# Patient Record
Sex: Female | Born: 1945 | Race: White | Hispanic: No | Marital: Married | Smoking: Former smoker
Health system: Southern US, Community
[De-identification: ages and names within clinical notes are randomized; demographics above are authoritative.]

## PROBLEM LIST (undated history)

## (undated) DIAGNOSIS — B004 Herpesviral encephalitis: Secondary | ICD-10-CM

## (undated) DIAGNOSIS — E119 Type 2 diabetes mellitus without complications: Secondary | ICD-10-CM

## (undated) DIAGNOSIS — E079 Disorder of thyroid, unspecified: Secondary | ICD-10-CM

## (undated) DIAGNOSIS — E039 Hypothyroidism, unspecified: Secondary | ICD-10-CM

## (undated) DIAGNOSIS — I639 Cerebral infarction, unspecified: Secondary | ICD-10-CM

## (undated) HISTORY — PX: COLONOSCOPY: SHX174

## (undated) HISTORY — PX: OTHER SURGICAL HISTORY: SHX169

## (undated) HISTORY — PX: BREAST SURGERY: SHX581

## (undated) HISTORY — PX: BACK SURGERY: SHX140

---

## 1998-11-03 HISTORY — PX: BACK SURGERY: SHX140

## 2004-11-06 ENCOUNTER — Ambulatory Visit: Payer: Self-pay

## 2005-12-24 ENCOUNTER — Ambulatory Visit: Payer: Self-pay

## 2006-01-01 ENCOUNTER — Ambulatory Visit: Payer: Self-pay

## 2006-11-11 ENCOUNTER — Ambulatory Visit: Payer: Self-pay | Admitting: Internal Medicine

## 2007-01-11 ENCOUNTER — Ambulatory Visit: Payer: Self-pay

## 2007-02-02 ENCOUNTER — Ambulatory Visit: Payer: Self-pay | Admitting: Gastroenterology

## 2007-12-28 ENCOUNTER — Ambulatory Visit: Payer: Self-pay | Admitting: Unknown Physician Specialty

## 2008-12-28 ENCOUNTER — Ambulatory Visit: Payer: Self-pay

## 2010-01-08 ENCOUNTER — Ambulatory Visit: Payer: Self-pay

## 2011-01-16 ENCOUNTER — Ambulatory Visit: Payer: Self-pay

## 2012-02-19 ENCOUNTER — Ambulatory Visit: Payer: Self-pay

## 2014-11-15 ENCOUNTER — Emergency Department: Payer: Self-pay | Admitting: Emergency Medicine

## 2014-11-16 LAB — CBC
HCT: 41.3 % (ref 35.0–47.0)
HGB: 13.4 g/dL (ref 12.0–16.0)
MCH: 27.8 pg (ref 26.0–34.0)
MCHC: 32.5 g/dL (ref 32.0–36.0)
MCV: 86 fL (ref 80–100)
Platelet: 244 10*3/uL (ref 150–440)
RBC: 4.83 10*6/uL (ref 3.80–5.20)
RDW: 15.4 % — AB (ref 11.5–14.5)
WBC: 9.6 10*3/uL (ref 3.6–11.0)

## 2014-11-16 LAB — BASIC METABOLIC PANEL
Anion Gap: 8 (ref 7–16)
BUN: 19 mg/dL — AB (ref 7–18)
CHLORIDE: 107 mmol/L (ref 98–107)
CO2: 27 mmol/L (ref 21–32)
CREATININE: 0.83 mg/dL (ref 0.60–1.30)
Calcium, Total: 9.3 mg/dL (ref 8.5–10.1)
EGFR (Non-African Amer.): >60
Glucose: 157 mg/dL — ABNORMAL HIGH (ref 65–99)
Osmolality: 289 (ref 275–301)
Potassium: 4 mmol/L (ref 3.5–5.1)
Sodium: 142 mmol/L (ref 136–145)

## 2014-11-16 LAB — TROPONIN I

## 2016-04-24 ENCOUNTER — Emergency Department: Payer: Medicare Other

## 2016-04-24 ENCOUNTER — Inpatient Hospital Stay: Payer: Medicare Other

## 2016-04-24 ENCOUNTER — Encounter: Payer: Self-pay | Admitting: Urgent Care

## 2016-04-24 ENCOUNTER — Other Ambulatory Visit: Payer: Self-pay

## 2016-04-24 ENCOUNTER — Inpatient Hospital Stay
Admission: EM | Admit: 2016-04-24 | Discharge: 2016-05-02 | DRG: 097 | Disposition: A | Discharge status: Other Acute Inpatient Hospital | Payer: Medicare Other | Attending: Internal Medicine | Admitting: Internal Medicine

## 2016-04-24 DIAGNOSIS — G4089 Other seizures: Secondary | ICD-10-CM | POA: Diagnosis present

## 2016-04-24 DIAGNOSIS — J9602 Acute respiratory failure with hypercapnia: Secondary | ICD-10-CM | POA: Diagnosis present

## 2016-04-24 DIAGNOSIS — J9601 Acute respiratory failure with hypoxia: Secondary | ICD-10-CM | POA: Diagnosis present

## 2016-04-24 DIAGNOSIS — N179 Acute kidney failure, unspecified: Secondary | ICD-10-CM | POA: Diagnosis present

## 2016-04-24 DIAGNOSIS — G0490 Encephalitis and encephalomyelitis, unspecified: Secondary | ICD-10-CM | POA: Diagnosis not present

## 2016-04-24 DIAGNOSIS — Z8249 Family history of ischemic heart disease and other diseases of the circulatory system: Secondary | ICD-10-CM

## 2016-04-24 DIAGNOSIS — A86 Unspecified viral encephalitis: Secondary | ICD-10-CM | POA: Diagnosis present

## 2016-04-24 DIAGNOSIS — Z833 Family history of diabetes mellitus: Secondary | ICD-10-CM | POA: Diagnosis not present

## 2016-04-24 DIAGNOSIS — Z4659 Encounter for fitting and adjustment of other gastrointestinal appliance and device: Secondary | ICD-10-CM

## 2016-04-24 DIAGNOSIS — Z8051 Family history of malignant neoplasm of kidney: Secondary | ICD-10-CM | POA: Diagnosis not present

## 2016-04-24 DIAGNOSIS — E119 Type 2 diabetes mellitus without complications: Secondary | ICD-10-CM | POA: Diagnosis present

## 2016-04-24 DIAGNOSIS — E039 Hypothyroidism, unspecified: Secondary | ICD-10-CM | POA: Diagnosis present

## 2016-04-24 DIAGNOSIS — J9600 Acute respiratory failure, unspecified whether with hypoxia or hypercapnia: Secondary | ICD-10-CM | POA: Diagnosis not present

## 2016-04-24 DIAGNOSIS — E871 Hypo-osmolality and hyponatremia: Secondary | ICD-10-CM | POA: Diagnosis present

## 2016-04-24 DIAGNOSIS — R569 Unspecified convulsions: Secondary | ICD-10-CM | POA: Diagnosis present

## 2016-04-24 DIAGNOSIS — R4182 Altered mental status, unspecified: Secondary | ICD-10-CM | POA: Diagnosis not present

## 2016-04-24 DIAGNOSIS — G9340 Encephalopathy, unspecified: Secondary | ICD-10-CM | POA: Diagnosis present

## 2016-04-24 DIAGNOSIS — R509 Fever, unspecified: Secondary | ICD-10-CM

## 2016-04-24 DIAGNOSIS — M7989 Other specified soft tissue disorders: Secondary | ICD-10-CM | POA: Diagnosis not present

## 2016-04-24 DIAGNOSIS — R609 Edema, unspecified: Secondary | ICD-10-CM

## 2016-04-24 DIAGNOSIS — Z452 Encounter for adjustment and management of vascular access device: Secondary | ICD-10-CM

## 2016-04-24 DIAGNOSIS — R404 Transient alteration of awareness: Secondary | ICD-10-CM | POA: Diagnosis not present

## 2016-04-24 DIAGNOSIS — B004 Herpesviral encephalitis: Secondary | ICD-10-CM | POA: Diagnosis not present

## 2016-04-24 DIAGNOSIS — Z87891 Personal history of nicotine dependence: Secondary | ICD-10-CM

## 2016-04-24 DIAGNOSIS — Z7984 Long term (current) use of oral hypoglycemic drugs: Secondary | ICD-10-CM | POA: Diagnosis not present

## 2016-04-24 DIAGNOSIS — Z79899 Other long term (current) drug therapy: Secondary | ICD-10-CM | POA: Diagnosis not present

## 2016-04-24 DIAGNOSIS — E876 Hypokalemia: Secondary | ICD-10-CM | POA: Diagnosis present

## 2016-04-24 HISTORY — DX: Disorder of thyroid, unspecified: E07.9

## 2016-04-24 HISTORY — DX: Hypothyroidism, unspecified: E03.9

## 2016-04-24 HISTORY — DX: Type 2 diabetes mellitus without complications: E11.9

## 2016-04-24 LAB — CBC WITH DIFFERENTIAL/PLATELET
BASOS PCT: 0 %
Band Neutrophils: 0 %
Basophils Absolute: 0 10*3/uL (ref 0–0.1)
Basophils Absolute: 0 10*3/uL (ref 0–0.1)
Blasts: 0 %
EOS PCT: 0 %
Eosinophils Absolute: 0 10*3/uL (ref 0–0.7)
Eosinophils Absolute: 0 10*3/uL (ref 0–0.7)
Eosinophils Relative: 0 %
HCT: 38.6 % (ref 35.0–47.0)
HEMATOCRIT: 38.7 % (ref 35.0–47.0)
HEMOGLOBIN: 13.5 g/dL (ref 12.0–16.0)
Hemoglobin: 13.3 g/dL (ref 12.0–16.0)
LYMPHS ABS: 5.3 10*3/uL — AB (ref 1.0–3.6)
Lymphocytes Relative: 12 %
Lymphocytes Relative: 26 %
Lymphs Abs: 1.4 10*3/uL (ref 1.0–3.6)
MCH: 28.6 pg (ref 26.0–34.0)
MCH: 29.4 pg (ref 26.0–34.0)
MCHC: 34.8 g/dL (ref 32.0–36.0)
MCHC: 34.4 g/dL (ref 32.0–36.0)
MCV: 82.1 fL (ref 80.0–100.0)
MCV: 85.3 fL (ref 80.0–100.0)
MONOS PCT: 16 %
MYELOCYTES: 0 %
Metamyelocytes Relative: 0 %
Monocytes Absolute: 1.8 10*3/uL — ABNORMAL HIGH (ref 0.2–0.9)
Monocytes Absolute: 3.2 10*3/uL — ABNORMAL HIGH (ref 0.2–0.9)
NEUTROS ABS: 8.4 10*3/uL — AB (ref 1.4–6.5)
NEUTROS PCT: 58 %
NRBC: 0 /100{WBCs}
Neutro Abs: 11.7 10*3/uL — ABNORMAL HIGH (ref 1.4–6.5)
OTHER: 0 %
PLATELETS: 269 10*3/uL (ref 150–440)
Platelets: 226 10*3/uL (ref 150–440)
Promyelocytes Absolute: 0 %
RBC: 4.72 MIL/uL (ref 3.80–5.20)
RBC: 4.53 MIL/uL (ref 3.80–5.20)
RDW: 14.4 % (ref 11.5–14.5)
RDW: 14.5 % (ref 11.5–14.5)
WBC: 11.6 10*3/uL — ABNORMAL HIGH (ref 3.6–11.0)
WBC: 20.2 10*3/uL — AB (ref 3.6–11.0)

## 2016-04-24 LAB — BLOOD GAS, ARTERIAL
Acid-base deficit: 1.3 mmol/L (ref 0.0–2.0)
Allens test (pass/fail): POSITIVE — AB
BICARBONATE: 22.7 meq/L (ref 21.0–28.0)
FIO2: 100
O2 Saturation: 99.3 %
PATIENT TEMPERATURE: 37
PH ART: 7.42 (ref 7.350–7.450)
pCO2 arterial: 35 mmHg (ref 32.0–48.0)
pO2, Arterial: 144 mmHg — ABNORMAL HIGH (ref 83.0–108.0)

## 2016-04-24 LAB — COMPREHENSIVE METABOLIC PANEL
ALBUMIN: 4.2 g/dL (ref 3.5–5.0)
ALK PHOS: 47 U/L (ref 38–126)
ALT: 19 U/L (ref 14–54)
ALT: 20 U/L (ref 14–54)
ANION GAP: 21 — AB (ref 5–15)
AST: 29 U/L (ref 15–41)
AST: 39 U/L (ref 15–41)
Albumin: 4.3 g/dL (ref 3.5–5.0)
Alkaline Phosphatase: 49 U/L (ref 38–126)
Anion gap: 11 (ref 5–15)
BILIRUBIN TOTAL: 0.7 mg/dL (ref 0.3–1.2)
BUN: 17 mg/dL (ref 6–20)
BUN: 15 mg/dL (ref 6–20)
CALCIUM: 8.6 mg/dL — AB (ref 8.9–10.3)
CHLORIDE: 91 mmol/L — AB (ref 101–111)
CO2: 23 mmol/L (ref 22–32)
CO2: 18 mmol/L — AB (ref 22–32)
CREATININE: 0.94 mg/dL (ref 0.44–1.00)
Calcium: 8.4 mg/dL — ABNORMAL LOW (ref 8.9–10.3)
Chloride: 92 mmol/L — ABNORMAL LOW (ref 101–111)
Creatinine, Ser: 1.24 mg/dL — ABNORMAL HIGH (ref 0.44–1.00)
GFR calc Af Amer: >60 mL/min (ref >60)
GFR calc non Af Amer: >60 mL/min (ref >60)
GFR, EST AFRICAN AMERICAN: 50 mL/min — AB (ref >60)
GFR, EST NON AFRICAN AMERICAN: 43 mL/min — AB (ref >60)
GLUCOSE: 149 mg/dL — AB (ref 65–99)
Glucose, Bld: 141 mg/dL — ABNORMAL HIGH (ref 65–99)
POTASSIUM: 3.2 mmol/L — AB (ref 3.5–5.1)
Potassium: 3.8 mmol/L (ref 3.5–5.1)
SODIUM: 130 mmol/L — AB (ref 135–145)
Sodium: 126 mmol/L — ABNORMAL LOW (ref 135–145)
TOTAL PROTEIN: 7.2 g/dL (ref 6.5–8.1)
Total Bilirubin: 0.5 mg/dL (ref 0.3–1.2)
Total Protein: 7.5 g/dL (ref 6.5–8.1)

## 2016-04-24 LAB — CSF CELL COUNT WITH DIFFERENTIAL
EOS CSF: 0 %
EOS CSF: 0 %
LYMPHS CSF: 56 %
Lymphs, CSF: 51 %
MONOCYTE-MACROPHAGE-SPINAL FLUID: 33 %
Monocyte-Macrophage-Spinal Fluid: 34 %
RBC COUNT CSF: 1361 /mm3 — AB (ref 0–3)
RBC COUNT CSF: 216 /mm3 — AB (ref 0–3)
SEGMENTED NEUTROPHILS-CSF: 10 %
Segmented Neutrophils-CSF: 16 %
TUBE #: 1
Tube #: 4
WBC CSF: 51 /mm3
WBC, CSF: 49 /mm3

## 2016-04-24 LAB — URINALYSIS COMPLETE WITH MICROSCOPIC (ARMC ONLY)
Bilirubin Urine: NEGATIVE
GLUCOSE, UA: 50 mg/dL — AB
Hgb urine dipstick: NEGATIVE
Ketones, ur: NEGATIVE mg/dL
Leukocytes, UA: NEGATIVE
Nitrite: NEGATIVE
Protein, ur: NEGATIVE mg/dL
SQUAMOUS EPITHELIAL / LPF: NONE SEEN
Specific Gravity, Urine: 1.012 (ref 1.005–1.030)
pH: 6 (ref 5.0–8.0)

## 2016-04-24 LAB — PROTEIN AND GLUCOSE, CSF
GLUCOSE CSF: 59 mg/dL (ref 40–70)
TOTAL PROTEIN, CSF: 73 mg/dL — AB (ref 15–45)

## 2016-04-24 LAB — PHOSPHORUS: Phosphorus: 1.7 mg/dL — ABNORMAL LOW (ref 2.5–4.6)

## 2016-04-24 LAB — LACTIC ACID, PLASMA
LACTIC ACID, VENOUS: 1.4 mmol/L (ref 0.5–2.0)
Lactic Acid, Venous: 1.4 mmol/L (ref 0.5–2.0)

## 2016-04-24 LAB — MAGNESIUM: Magnesium: 1.8 mg/dL (ref 1.7–2.4)

## 2016-04-24 LAB — GLUCOSE, CAPILLARY
GLUCOSE-CAPILLARY: 165 mg/dL — AB (ref 65–99)
Glucose-Capillary: 158 mg/dL — ABNORMAL HIGH (ref 65–99)

## 2016-04-24 LAB — MRSA PCR SCREENING: MRSA by PCR: NEGATIVE

## 2016-04-24 LAB — T4, FREE: FREE T4: 1.52 ng/dL — AB (ref 0.61–1.12)

## 2016-04-24 LAB — TROPONIN I: Troponin I: <0.03 ng/mL (ref <0.031)

## 2016-04-24 MED ORDER — DEXTROSE 5 % IV SOLN
2.0000 g | Freq: Two times a day (BID) | INTRAVENOUS | Status: DC
  Administered 2016-04-24: 2 g via INTRAVENOUS
  Filled 2016-04-24 (×3): qty 2

## 2016-04-24 MED ORDER — LORAZEPAM 2 MG/ML IJ SOLN
2.0000 mg | Freq: Once | INTRAMUSCULAR | Status: AC
  Administered 2016-04-24: 2 mg via INTRAVENOUS

## 2016-04-24 MED ORDER — LORAZEPAM 2 MG/ML IJ SOLN
INTRAMUSCULAR | Status: AC
  Administered 2016-04-24: 1 mg via INTRAVENOUS
  Filled 2016-04-24: qty 1

## 2016-04-24 MED ORDER — VANCOMYCIN HCL IN DEXTROSE 1-5 GM/200ML-% IV SOLN
1000.0000 mg | Freq: Once | INTRAVENOUS | Status: AC
  Administered 2016-04-24: 1000 mg via INTRAVENOUS
  Filled 2016-04-24: qty 200

## 2016-04-24 MED ORDER — ACETAMINOPHEN 325 MG PO TABS
650.0000 mg | ORAL_TABLET | ORAL | Status: DC | PRN
  Administered 2016-04-24 – 2016-04-25 (×3): 650 mg via ORAL
  Filled 2016-04-24 (×4): qty 2

## 2016-04-24 MED ORDER — ACETAMINOPHEN 650 MG RE SUPP
RECTAL | Status: AC
  Administered 2016-04-24: 650 mg via RECTAL
  Filled 2016-04-24: qty 1

## 2016-04-24 MED ORDER — SODIUM CHLORIDE 0.9 % IV BOLUS (SEPSIS)
500.0000 mL | Freq: Once | INTRAVENOUS | Status: AC
  Administered 2016-04-24: 500 mL via INTRAVENOUS

## 2016-04-24 MED ORDER — NOREPINEPHRINE 4 MG/250ML-% IV SOLN
2.0000 ug/min | INTRAVENOUS | Status: DC
  Administered 2016-04-24: 2 ug/min via INTRAVENOUS
  Administered 2016-04-25: 4 ug/min via INTRAVENOUS
  Administered 2016-04-25: 3.5 ug/min via INTRAVENOUS
  Administered 2016-04-26: 2 ug/min via INTRAVENOUS
  Administered 2016-04-26: 3 ug/min via INTRAVENOUS
  Administered 2016-04-26: 2.5 ug/min via INTRAVENOUS
  Filled 2016-04-24 (×4): qty 250

## 2016-04-24 MED ORDER — FENTANYL CITRATE (PF) 100 MCG/2ML IJ SOLN: 50.0000 ug | Freq: Once | INTRAMUSCULAR | Status: DC

## 2016-04-24 MED ORDER — ETOMIDATE 2 MG/ML IV SOLN
20.0000 mg | Freq: Once | INTRAVENOUS | Status: AC
  Administered 2016-04-24: 20 mg via INTRAVENOUS

## 2016-04-24 MED ORDER — ACETAMINOPHEN 650 MG RE SUPP
650.0000 mg | Freq: Once | RECTAL | Status: AC
  Administered 2016-04-24: 650 mg via RECTAL

## 2016-04-24 MED ORDER — DEXTROSE 5 % IV SOLN
1.0000 g | Freq: Once | INTRAVENOUS | Status: AC
  Administered 2016-04-24: 1 g via INTRAVENOUS
  Filled 2016-04-24: qty 10

## 2016-04-24 MED ORDER — MAGNESIUM SULFATE 2 GM/50ML IV SOLN
2.0000 g | Freq: Once | INTRAVENOUS | Status: AC
  Administered 2016-04-24: 2 g via INTRAVENOUS
  Filled 2016-04-24: qty 50

## 2016-04-24 MED ORDER — DEXTROSE 5 % IV SOLN
30.0000 mmol | Freq: Once | INTRAVENOUS | Status: AC
  Administered 2016-04-24: 30 mmol via INTRAVENOUS
  Filled 2016-04-24: qty 10

## 2016-04-24 MED ORDER — FAMOTIDINE IN NACL 20-0.9 MG/50ML-% IV SOLN
20.0000 mg | Freq: Two times a day (BID) | INTRAVENOUS | Status: DC
  Administered 2016-04-24 – 2016-04-25 (×2): 20 mg via INTRAVENOUS
  Filled 2016-04-24 (×3): qty 50

## 2016-04-24 MED ORDER — FENTANYL BOLUS VIA INFUSION
25.0000 ug | INTRAVENOUS | Status: DC | PRN
  Administered 2016-04-24 – 2016-04-25 (×5): 25 ug via INTRAVENOUS
  Filled 2016-04-24: qty 25

## 2016-04-24 MED ORDER — ONDANSETRON HCL 4 MG/2ML IJ SOLN: 4.0000 mg | Freq: Four times a day (QID) | INTRAMUSCULAR | Status: DC | PRN

## 2016-04-24 MED ORDER — INSULIN ASPART 100 UNIT/ML ~~LOC~~ SOLN
2.0000 [IU] | SUBCUTANEOUS | Status: DC
  Administered 2016-04-24 (×2): 4 [IU] via SUBCUTANEOUS
  Administered 2016-04-25 (×3): 2 [IU] via SUBCUTANEOUS
  Administered 2016-04-25: 4 [IU] via SUBCUTANEOUS
  Filled 2016-04-24: qty 2
  Filled 2016-04-24 (×2): qty 4
  Filled 2016-04-24 (×2): qty 2
  Filled 2016-04-24: qty 4

## 2016-04-24 MED ORDER — SODIUM CHLORIDE 0.9 % IV SOLN
INTRAVENOUS | Status: DC
  Administered 2016-04-24 – 2016-05-01 (×6): via INTRAVENOUS

## 2016-04-24 MED ORDER — SODIUM CHLORIDE 0.9% FLUSH
3.0000 mL | Freq: Two times a day (BID) | INTRAVENOUS | Status: DC
  Administered 2016-04-24 – 2016-04-28 (×8): 3 mL via INTRAVENOUS

## 2016-04-24 MED ORDER — LORAZEPAM 2 MG/ML IJ SOLN
1.0000 mg | Freq: Once | INTRAMUSCULAR | Status: AC
  Administered 2016-04-24: 1 mg via INTRAVENOUS

## 2016-04-24 MED ORDER — DEXTROSE 5 % IV SOLN
800.0000 mg | Freq: Two times a day (BID) | INTRAVENOUS | Status: DC
  Administered 2016-04-24: 800 mg via INTRAVENOUS
  Filled 2016-04-24 (×3): qty 16

## 2016-04-24 MED ORDER — MIDAZOLAM HCL 2 MG/2ML IJ SOLN
1.0000 mg | INTRAMUSCULAR | Status: AC | PRN
  Administered 2016-04-24 (×3): 1 mg via INTRAVENOUS
  Filled 2016-04-24: qty 2

## 2016-04-24 MED ORDER — SODIUM CHLORIDE 0.9 % IV BOLUS (SEPSIS)
250.0000 mL | INTRAVENOUS | Status: AC
  Administered 2016-04-24: 250 mL via INTRAVENOUS

## 2016-04-24 MED ORDER — DEXTROSE 5 % IV SOLN
10.0000 mg/kg | Freq: Once | INTRAVENOUS | Status: AC
  Administered 2016-04-24: 815 mg via INTRAVENOUS
  Filled 2016-04-24: qty 16.3

## 2016-04-24 MED ORDER — SODIUM CHLORIDE 0.9 % IV SOLN: 250.0000 mL | INTRAVENOUS | Status: DC | PRN

## 2016-04-24 MED ORDER — VANCOMYCIN HCL IN DEXTROSE 1-5 GM/200ML-% IV SOLN
1000.0000 mg | INTRAVENOUS | Status: DC
  Administered 2016-04-24: 1000 mg via INTRAVENOUS
  Filled 2016-04-24 (×3): qty 200

## 2016-04-24 MED ORDER — SODIUM CHLORIDE 0.9 % IV SOLN
500.0000 mg | Freq: Two times a day (BID) | INTRAVENOUS | Status: DC
  Administered 2016-04-25 – 2016-04-30 (×11): 500 mg via INTRAVENOUS
  Filled 2016-04-24 (×13): qty 5

## 2016-04-24 MED ORDER — ENOXAPARIN SODIUM 40 MG/0.4ML ~~LOC~~ SOLN
40.0000 mg | SUBCUTANEOUS | Status: DC
  Administered 2016-04-24 – 2016-05-01 (×8): 40 mg via SUBCUTANEOUS
  Filled 2016-04-24 (×9): qty 0.4

## 2016-04-24 MED ORDER — SODIUM CHLORIDE 0.9 % IV SOLN
1000.0000 mL | Freq: Once | INTRAVENOUS | Status: AC
  Administered 2016-04-24: 1000 mL via INTRAVENOUS

## 2016-04-24 MED ORDER — MIDAZOLAM HCL 2 MG/2ML IJ SOLN
1.0000 mg | INTRAMUSCULAR | Status: DC | PRN
  Administered 2016-04-24 – 2016-04-26 (×12): 1 mg via INTRAVENOUS
  Filled 2016-04-24 (×11): qty 2

## 2016-04-24 MED ORDER — SUCCINYLCHOLINE CHLORIDE 20 MG/ML IJ SOLN
100.0000 mg | Freq: Once | INTRAMUSCULAR | Status: AC
  Administered 2016-04-24: 100 mg via INTRAVENOUS

## 2016-04-24 MED ORDER — SODIUM CHLORIDE 0.9 % IV SOLN
2.0000 g | INTRAVENOUS | Status: DC
  Administered 2016-04-24 – 2016-04-25 (×5): 2 g via INTRAVENOUS
  Filled 2016-04-24 (×11): qty 2000

## 2016-04-24 MED ORDER — FENTANYL 2500MCG IN NS 250ML (10MCG/ML) PREMIX INFUSION
25.0000 ug/h | INTRAVENOUS | Status: DC
  Administered 2016-04-24: 50 ug/h via INTRAVENOUS
  Administered 2016-04-25 (×2): 300 ug/h via INTRAVENOUS
  Administered 2016-04-25: 250 ug/h via INTRAVENOUS
  Administered 2016-04-26: 300 ug/h via INTRAVENOUS
  Filled 2016-04-24 (×5): qty 250

## 2016-04-24 MED ORDER — POTASSIUM CHLORIDE 10 MEQ/50ML IV SOLN
10.0000 meq | INTRAVENOUS | Status: DC
  Administered 2016-04-24 (×4): 10 meq via INTRAVENOUS
  Filled 2016-04-24 (×4): qty 50

## 2016-04-24 MED ORDER — LEVOTHYROXINE SODIUM 100 MCG PO TABS
100.0000 ug | ORAL_TABLET | Freq: Every day | ORAL | Status: DC
  Administered 2016-04-25 – 2016-05-02 (×6): 100 ug via ORAL
  Filled 2016-04-24 (×5): qty 1

## 2016-04-24 MED ORDER — DEXTROSE 5 % IV SOLN: 2.0000 ug/min | INTRAVENOUS | Status: DC

## 2016-04-24 MED ORDER — LORAZEPAM 2 MG/ML IJ SOLN
INTRAMUSCULAR | Status: AC
  Administered 2016-04-24: 2 mg via INTRAVENOUS
  Filled 2016-04-24: qty 1

## 2016-04-24 MED ORDER — FAMOTIDINE IN NACL 20-0.9 MG/50ML-% IV SOLN: 20.0000 mg | INTRAVENOUS | Status: DC

## 2016-04-24 MED ORDER — CHLORHEXIDINE GLUCONATE 0.12% ORAL RINSE (MEDLINE KIT)
15.0000 mL | Freq: Two times a day (BID) | OROMUCOSAL | Status: DC
  Administered 2016-04-24 – 2016-04-26 (×4): 15 mL via OROMUCOSAL
  Filled 2016-04-24 (×5): qty 15

## 2016-04-24 MED ORDER — SODIUM CHLORIDE 0.9% FLUSH: 3.0000 mL | INTRAVENOUS | Status: DC | PRN

## 2016-04-24 MED ORDER — SODIUM CHLORIDE 0.9 % IV SOLN
1000.0000 mg | Freq: Once | INTRAVENOUS | Status: AC
  Administered 2016-04-24: 1000 mg via INTRAVENOUS
  Filled 2016-04-24: qty 10

## 2016-04-24 MED ORDER — ANTISEPTIC ORAL RINSE SOLUTION (CORINZ)
7.0000 mL | OROMUCOSAL | Status: DC
  Administered 2016-04-24 – 2016-04-26 (×15): 7 mL via OROMUCOSAL
  Filled 2016-04-24 (×21): qty 7

## 2016-04-24 NOTE — ED Provider Notes (Addendum)
[facility] Emergency Department Provider Note  L5 caveat: Review of systems and history is limited by confusion      Time seen: ----------------------------------------- 7:02 AM on 04/24/2016 -----------------------------------------    I have reviewed the triage vital signs and the nursing notes.   HISTORY  Chief Complaint No chief complaint on file.    HPI Cathy Harrington is a 70 y.o. female who presents the ER for altered mental status since Monday. Currently the patient is taking antibiotics for UTI. Eyes was concerned because she is having trouble thinking and speaking. States she cannot get her words out, is having difficulty communicating. Husband states when she starts to say something she never completes her thoughts. Situations, located by native language being Arabic. Patient states she is not in pain currently.   No past medical history on file.  There are no active problems to display for this patient.   No past surgical history on file.  Allergies Review of patient's allergies indicates not on file.  Social History Social History  Substance Use Topics  . Smoking status: Not on file  . Smokeless tobacco: Not on file  . Alcohol Use: Not on file    Review of Systems Unknown at this time. Positive for confusion, speech disturbance  ____________________________________________   PHYSICAL EXAM:  VITAL SIGNS: ED Triage Vitals  Enc Vitals Group     BP --      Pulse --      Resp --      Temp --      Temp src --      SpO2 --      Weight --      Height --      Head Cir --      Peak Flow --      Pain Score --      Pain Loc --      Pain Edu? --      Excl. in [place]? --     Constitutional: Alert But disoriented. No acute distress Eyes: Conjunctivae are normal. PERRL. Normal extraocular movements. ENT   Head: Normocephalic and atraumatic.   Nose: No congestion/rhinnorhea.   Mouth/Throat: Mucous membranes are  moist.   Neck: No stridor. Cardiovascular: Normal rate, regular rhythm. No murmurs, rubs, or gallops. Respiratory: Normal respiratory effort without tachypnea nor retractions. Breath sounds are clear and equal bilaterally. No wheezes/rales/rhonchi. Gastrointestinal: Soft and nontender. Normal bowel sounds Musculoskeletal: Nontender with normal range of motion in all extremities. No lower extremity tenderness nor edema. Neurologic:   Patient presents with speech and language difficulty. At times she cannot find work to say, she quickly reverts to speaking Arabic. She perseverates, has difficulty following simple tasks. Cranial nerves and motor function appear to be intact Skin:  Skin is warm, dry and intact. No rash noted. Psychiatric: Mood and affect are normal ____________________________________________  EKG: Interpreted by me. Sinus tachycardia with rate 107 bpm, normal axis, normal intervals, no evidence of hypertrophy or acute infarction. Unremarkable EKG  ____________________________________________  ED COURSE:  Pertinent labs & imaging results that were available during my care of the patient were reviewed by me and considered in my medical decision making (see chart for details). Patient resisted altered mental status. She'll be assessed for CVA as well as infection.   Patient had an uneventful course until she abruptly spiked a temperature to almost 104. Subsequently she had a seizure, generalized tonic-clonic seizure. She was given IV Ativan as well as started on  IV Keppra. We have started her on IV Rocephin, vancomycin, acyclovir for possible meningitis or encephalitis. Labs otherwise reveal hyponatremia which has been treated with saline. ____________________________________________    LABS (pertinent positives/negatives)  Labs Reviewed  CBC WITH DIFFERENTIAL/PLATELET - Abnormal; Notable for the following:    WBC 11.6 (*)    Neutro Abs 8.4 (*)    Monocytes Absolute 1.8  (*)    All other components within normal limits  COMPREHENSIVE METABOLIC PANEL - Abnormal; Notable for the following:    Sodium 126 (*)    Chloride 92 (*)    Glucose, Bld 149 (*)    Calcium 8.6 (*)    All other components within normal limits  URINALYSIS COMPLETEWITH MICROSCOPIC ([facility] ONLY) - Abnormal; Notable for the following:    Color, Urine YELLOW (*)    APPearance CLEAR (*)    Glucose, UA 50 (*)    Bacteria, UA RARE (*)    All other components within normal limits  T4, FREE - Abnormal; Notable for the following:    Free T4 1.52 (*)    All other components within normal limits  CBC WITH DIFFERENTIAL/PLATELET - Abnormal; Notable for the following:    WBC 20.2 (*)    All other components within normal limits  CULTURE, BLOOD (ROUTINE X 2)  CULTURE, BLOOD (ROUTINE X 2)  URINE CULTURE  CSF CULTURE  GRAM STAIN  CULTURE, BLOOD (ROUTINE X 2)  CULTURE, BLOOD (ROUTINE X 2)  TROPONIN I  LACTIC ACID, PLASMA  LACTIC ACID, PLASMA  CSF CELL COUNT WITH DIFFERENTIAL  CSF CELL COUNT WITH DIFFERENTIAL  PROTEIN AND GLUCOSE, CSF  COMPREHENSIVE METABOLIC PANEL  HIV ANTIBODY (ROUTINE TESTING)  HERPES SIMPLEX VIRUS(HSV) DNA BY PCR    RADIOLOGY Images were viewed by me  CT head   IMPRESSION: Mild chronic ischemic white matter disease. No acute intracranial abnormality seen.  INTUBATION Performed by: Lenise Arena E  Required items: required blood products, implants, devices, and special equipment available Patient identity confirmed: provided demographic data and hospital-assigned identification number Time out: Immediately prior to procedure a "time out" was called to verify the correct patient, procedure, equipment, support staff and site/side marked as required.  Indications: Altered mental status, airway protection   Intubation method: Glidescope Laryngoscopy   Preoxygenation: 100% BVM  Sedatives: 20 mg Etomidate Paralytic: 100 mg Succinylcholine  Tube Size: 8.0  cuffed  Post-procedure assessment: chest rise  Breath sounds: equal and absent over the epigastrium Tube secured with: ETT holder Chest x-ray interpreted by radiologist and me.  Chest x-ray findings: endotracheal tube in appropriate position  Patient tolerated the procedure well with no immediate complications.    LUMBAR PUNCTURE  Date/Time: 04/24/2016 at 12:19 PM Performed by: Lenise Arena E  Consent: Verbal consent obtained. Written consent obtained. Risks and benefits: risks, benefits and alternatives were discussed Consent given by: husband Patient understanding: patient states understanding of the procedure being performed  Patient consent: the patient's understanding of the procedure matches consent given  Procedure consent: procedure consent matches procedure scheduled  Relevant documents: relevant documents present and verified  Test results: test results available and properly labeled Site marked: the operative site was marked Imaging studies: imaging studies available  Required items: required blood products, implants, devices, and special equipment available  Patient identity confirmed: verbally with patient and arm band  Time out: Immediately prior to procedure a "time out" was called to verify the correct patient, procedure, equipment, support staff and site/side marked as required.  Indications: Fever,  altered mental status, seizure  Anesthesia: local infiltration Local anesthetic: lidocaine 1% without epinephrine Anesthetic total: 3 ml Patient sedated: Yes, IV Ativan  Analgesia: Ativan  Preparation: Patient was prepped and draped in the usual sterile fashion. Lumbar space: L3-L4 interspace Patient's position: left lateral decubitus Needle gauge: 22 Needle length: 3.5 in Number of attempts: 2 Fluid appearance: Clear Tubes of fluid: 4 Total volume: For ml Post-procedure: site cleaned and adhesive bandage applied Patient tolerance: Patient tolerated  the procedure well with no immediate complications CRITICAL CARE Performed by: Earleen Newport   Total critical care time: 30 minutes  Critical care time was exclusive of separately billable procedures and treating other patients.  Critical care was necessary to treat or prevent imminent or life-threatening deterioration.  Critical care was time spent personally by me on the following activities: development of treatment plan with patient and/or surrogate as well as nursing, discussions with consultants, evaluation of patient's response to treatment, examination of patient, obtaining history from patient or surrogate, ordering and performing treatments and interventions, ordering and review of laboratory studies, ordering and review of radiographic studies, pulse oximetry and re-evaluation of patient's condition.  ____________________________________________  FINAL ASSESSMENT AND PLAN  Altered mental status, fever, seizure, lumbar puncture  Plan: Patient with labs and imaging as dictated above. Patient was ED course as dictated above. She has been started on IV antibiotics and antiviral medicine to cover for meningitis or encephalitis. Patient may require intubation at some point at this point she is maintaining her airway. She does however remaining critical condition.   Earleen Newport, MD   Note: This dictation was prepared with Dragon dictation. Any transcriptional errors that result from this process are unintentional   Earleen Newport, MD 04/24/16 [place], MD 04/24/16 1220  Earleen Newport, MD 04/24/16 [place], MD 04/24/16 [phone]

## 2016-04-24 NOTE — Plan of Care (Signed)
Problem: Fluid Volume: Goal: Hemodynamic stability will improve Outcome: Progressing BP trends down with increase in sedation.  542ml NS bolus x2. Central line now being inserted for possible pressor support.  Problem: Physical Regulation: Goal: Diagnostic test results will improve Outcome: Progressing Lactic acid 1.4 Goal: Signs and symptoms of infection will decrease Outcome: Progressing Blood cultures, LP , sputum cultures all pending. Elevated WBC's. Multiple antibiotics ordered and given  Problem: Respiratory: Goal: Ability to maintain adequate ventilation will improve Outcome: Progressing Rhonchi bilateral lung fields.  Sputum white. Weaning fio2 to 40%  Problem: Activity: Goal: Ability to tolerate increased activity will improve Outcome: Progressing Bedrest.  Moves all extremities.  Localizes pain.  Problem: Coping: Goal: Level of anxiety will decrease Outcome: Progressing Anxiety improves with versed prn.  Problem: Nutritional: Goal: Intake of prescribed amount of daily calories will improve Outcome: Not Progressing NPO for now  Problem: Respiratory: Goal: Ability to maintain a clear airway and adequate ventilation will improve Outcome: Progressing VAP protocol initiated  Problem: Skin Integrity Impairment Risk: Goal: Risk for impaired skin integrity will decrease Outcome: Progressing No skin breakdown. Pink foam placed to sacrum.  Airflow rotation bed

## 2016-04-24 NOTE — ED Notes (Signed)
Pt appears sweaty and gazing without eye contact with stimuli.  Pt not following commands as well as before.  Pt unable to sit up or stand with 2 person assistance.  Pt tachypneic.  Temp 103.1 axillary.  PT I/O cathed at this time with 159ml out with cath.  MD notified.

## 2016-04-24 NOTE — Consult Note (Addendum)
Reason for Consult:Seizure Referring Physician: Kasa  CC: Seizure  HPI: Cathy Harrington is an 70 y.o. female who was brought in by her husband today due to an altered mental status since Monday.  Has had difficulty getting her words out.  Does not seem to know what she wants to say.  Today on presentation was able to walk in but at times was speaking in [place] and at other times speaking in Arabic.  Followed commands but easily distractible.  Disoriented.  Patient became febrile while in the ED.  Was noted to have eye deviation and then a generalized tonic clonic seizure.   On antibiotics for a UTI prior to admission.   Past Medical History  Diagnosis Date  . Thyroid disease   . Diabetes mellitus without complication [facility])     Past Surgical History  Procedure Laterality Date  . Back surgery      Family history: Father deceased.  With DM and renal cancer.  Mother alive with hypertension.    Social History:  reports that she has quit smoking. She does not have any smokeless tobacco history on file. She reports that she does not drink alcohol. Her drug history is not on file.  No Known Allergies  Medications:  I have reviewed the patient's current medications. Prior to Admission:  Prescriptions prior to admission  Medication Sig Dispense Refill Last Dose  . levothyroxine (SYNTHROID, LEVOTHROID) 100 MCG tablet Take 100 mcg by mouth daily.   04/23/2016 at am  . metFORMIN (GLUCOPHAGE) 1000 MG tablet Take 1,000 mg by mouth 2 (two) times daily.   04/23/2016 at pm  . sulfamethoxazole-trimethoprim (BACTRIM DS,SEPTRA DS) 800-160 MG tablet Take 1 tablet by mouth 2 (two) times daily. For 10 days   04/24/2016 at am   Scheduled: . acyclovir  800 mg Intravenous Q12H  . ampicillin (OMNIPEN) IV  2 g Intravenous Q4H  . cefTRIAXone (ROCEPHIN)  IV  1 g Intravenous Once  . cefTRIAXone (ROCEPHIN)  IV  2 g Intravenous Q12H  . enoxaparin (LOVENOX) injection  40 mg Subcutaneous Q24H  . famotidine (PEPCID)  IV  20 mg Intravenous Q12H  . fentaNYL (SUBLIMAZE) injection  50 mcg Intravenous Once  . magnesium sulfate 1 - 4 g bolus IVPB  2 g Intravenous Once  . potassium chloride  10 mEq Intravenous Q1 Hr x 4  . sodium chloride flush  3 mL Intravenous Q12H  . vancomycin  1,000 mg Intravenous Q18H    ROS: Unable to obtain due to intubation  Physical Examination: Blood pressure 123/75, pulse 117, temperature 103.1 F (39.5 C), temperature source Axillary, resp. rate 21, height 5\' 3"  (1.6 m), weight 81.647 kg (180 lb), SpO2 97 %.  HEENT-  Normocephalic, no lesions, without obvious abnormality.  Normal external eye and conjunctiva.  Normal TM's bilaterally.  Normal auditory canals and external ears. Normal external nose, mucus membranes and septum.  Normal pharynx. Cardiovascular- S1, S2 normal, pulses palpable throughout   Lungs- chest clear, no wheezing, rales, normal symmetric air entry Abdomen- soft, non-tender; bowel sounds normal; no masses,  no organomegaly Extremities- no edema Lymph-no adenopathy palpable Musculoskeletal-no joint tenderness, deformity or swelling Skin-warm and dry, no hyperpigmentation, vitiligo, or suspicious lesions  Neurological Examination: Patient intubated Mental Status: Eyes open.  Follows some simple commands.  No speech.   Cranial Nerves: II: patient does not respond confrontation bilaterally, pupils right 3 mm, left 3 mm,and reactive bilaterally III,IV,VI: Left gaze preference.  Does not go beyond midline to the  right.   V,VII: corneal reflex present bilaterally  VIII: grossly intact IX,X: gag reflex reduced, XI: trapezius strength unable to test bilaterally XII: tongue strength unable to test but patient attempts to extend tongue to command Motor: Moves all extremities with no focal weakness noted.   Sensory: Responds to noxious stimuli in all extremities. Deep Tendon Reflexes:  1+ in the upper extremities and absent in the lower  extremities Plantars: Mute  bilaterally Cerebellar: Unable to perform   Laboratory Studies:   Basic Metabolic Panel:  Recent Labs Lab 04/24/16 0750 04/24/16 1138  NA 126* 130*  K 3.8 3.2*  CL 92* 91*  CO2 23 18*  GLUCOSE 149* 141*  BUN 17 15  CREATININE 0.94 1.24*  CALCIUM 8.6* 8.4*  MG  --  1.8  PHOS  --  1.7*    Liver Function Tests:  Recent Labs Lab 04/24/16 0750 04/24/16 1138  AST 29 39  ALT 19 20  ALKPHOS 47 49  BILITOT 0.7 0.5  PROT 7.2 7.5  ALBUMIN 4.2 4.3   No results for input(s): LIPASE, AMYLASE in the last 168 hours. No results for input(s): AMMONIA in the last 168 hours.  CBC:  Recent Labs Lab 04/24/16 0750 04/24/16 1138  WBC 11.6* 20.2*  NEUTROABS 8.4* 11.7*  HGB 13.5 13.3  HCT 38.7 38.6  MCV 82.1 85.3  PLT 226 269    Cardiac Enzymes:  Recent Labs Lab 04/24/16 0750  TROPONINI <0.03    BNP: Invalid input(s): POCBNP  CBG: No results for input(s): GLUCAP in the last 168 hours.  Microbiology: Results for orders placed or performed during the hospital encounter of 04/24/16  CSF culture     Status: None (Preliminary result)   Collection Time: 04/24/16 11:38 AM  Result Value Ref Range Status   Specimen Description CSF  Final   Special Requests Normal  Final   Gram Stain RARE WBC SEEN NO ORGANISMS SEEN   Final   Culture PENDING  Incomplete   Report Status PENDING  Incomplete    Coagulation Studies: No results for input(s): LABPROT, INR in the last 72 hours.  Urinalysis:  Recent Labs Lab 04/24/16 1045  COLORURINE YELLOW*  LABSPEC 1.012  PHURINE 6.0  GLUCOSEU 50*  HGBUR NEGATIVE  BILIRUBINUR NEGATIVE  KETONESUR NEGATIVE  PROTEINUR NEGATIVE  NITRITE NEGATIVE  LEUKOCYTESUR NEGATIVE    Lipid Panel:  No results found for: CHOL, TRIG, HDL, CHOLHDL, VLDL, LDLCALC  HgbA1C: No results found for: HGBA1C  Urine Drug Screen:  No results found for: LABOPIA, COCAINSCRNUR, LABBENZ, AMPHETMU, THCU, LABBARB  Alcohol  Level: No results for input(s): ETH in the last 168 hours.  Other results: EKG: sinus tachycardia at 107 bpm.  Imaging: Dg Chest 1 View  04/24/2016  CLINICAL DATA:  Status post intubation. EXAM: CHEST 1 VIEW COMPARISON:  None FINDINGS: The ET tube tip is in the right mainstem bronchus. This should be withdrawn by approximately 1-2 cm. The heart size is enlarged and there is diffuse pulmonary vascular congestion. No focal airspace consolidation noted. IMPRESSION: 1. Right mainstem bronchus intubation. These results will be called to the ordering clinician or representative by the Radiologist Assistant, and communication documented in the PACS or zVision Dashboard. Electronically Signed   By: Kerby Moors M.D.   On: 04/24/2016 13:25   Ct Head Wo Contrast  04/24/2016  CLINICAL DATA:  Altered mental status. EXAM: CT HEAD WITHOUT CONTRAST TECHNIQUE: Contiguous axial images were obtained from the base of the skull through the vertex without  intravenous contrast. COMPARISON:  None. FINDINGS: Bony calvarium appears intact. Mild chronic ischemic white matter disease is noted. No mass effect or midline shift is noted. Ventricular size is within normal limits. There is no evidence of mass lesion, hemorrhage or acute infarction. IMPRESSION: Mild chronic ischemic white matter disease. No acute intracranial abnormality seen. Electronically Signed   By: Marijo Conception, M.D.   On: 04/24/2016 08:52     Assessment/Plan: 70 year old female presenting with confusion, fever, elevated white blood cell count and seizures.  No source of infection noted at this time.  LP performed in the ED.  Protein elevated.  Cell count remains pending.  Head CT personally reviewed and shows no acute changes.  Concern is for CNS infection.  Received Keppra in the ED.  Although patient remains altered with her following commands, unlikely that she is in subclinical status epilepticus.  EEG not indicated at this time.      Recommendations: 1.  Keppra 500mg  IV q 12hours for maintenance Keppra 2.  Seizure precautions 3.  Ativan prn seizures 4.  Agree with broad spectrum coverage to include Acyclovir.   5.  MRI of the brain with and without contrast once patient stable.    This patient is critically ill and at significant risk of neurological worsening, death and care requires constant monitoring of vital signs, hemodynamics,respiratory and cardiac monitoring, neurological assessment, discussion with family, other specialists and medical decision making of high complexity. I spent 45 minutes of neurocritical care time  in the care of  this patient.  Alexis Goodell, MD Neurology [phone] 04/24/2016  2:21 PM

## 2016-04-24 NOTE — ED Notes (Signed)
Lumbar puncture done at bedside at this time by Dr. Jimmye Norman with myself assisting.

## 2016-04-24 NOTE — Progress Notes (Signed)
[place] for electrolyte management   No Known Allergies  Patient Measurements: Height: 5\' 3"  (160 cm) Weight: 183 lb 10.3 oz (83.3 kg) IBW/kg (Calculated) : 52.4   Vital Signs: Temp: 101 F (38.3 C) (06/22 1408) Temp Source: Axillary (06/22 1408) BP: 105/65 mmHg (06/22 1420) Pulse Rate: 104 (06/22 1420) Intake/Output from previous day:   Intake/Output from this shift: Total I/O In: 50 [IV Piggyback:50] Out: 1500 [Urine:1500]  Labs:  Recent Labs  04/24/16 0750 04/24/16 1138  WBC 11.6* 20.2*  HGB 13.5 13.3  HCT 38.7 38.6  PLT 226 269  CREATININE 0.94 1.24*  MG  --  1.8  PHOS  --  1.7*  ALBUMIN 4.2 4.3  PROT 7.2 7.5  AST 29 39  ALT 19 20  ALKPHOS 47 49  BILITOT 0.7 0.5   Estimated Creatinine Clearance: 43.8 mL/min (by C-G formula based on Cr of 1.24).   Assessment: Pharmacy consulted for electrolyte management for 70 yo female admitted with possible meningitis and requiring mechanical ventilation.    Plan:  Will order potassium phosphate 55mmol IV x1 and magnesium 2g IV x 1.    Will recheck electrolytes with am labs.    Pharmacy will continue to monitor and adjust per consult.    Simpson,Michael L 04/24/2016,2:58 PM

## 2016-04-24 NOTE — Progress Notes (Signed)
Pharmacy Antibiotic Note  Cathy Harrington is a 70 y.o. female admitted on 04/24/2016 with meningitis.  Pharmacy has been consulted for antibiotic dosing.  Plan: Acyclovir 800mg  IV Q12hr.  Ampicillin 2g IV Q4hr.  Ceftriaxone 2g IV Q12hr.  Vancomycin 1g IV Q18hr for goal trough of 15-20.     Height: 5\' 3"  (160 cm) Weight: 183 lb 10.3 oz (83.3 kg) IBW/kg (Calculated) : 52.4  Temp (24hrs), Avg:100.8 F (38.2 C), Min:98.2 F (36.8 C), Max:103.1 F (39.5 C)   Recent Labs Lab 04/24/16 0750 04/24/16 1138 04/24/16 1405  WBC 11.6* 20.2*  --   CREATININE 0.94 1.24*  --   LATICACIDVEN  --  1.4 1.4    Estimated Creatinine Clearance: 43.8 mL/min (by C-G formula based on Cr of 1.24).    No Known Allergies  Antimicrobials this admission: Acyclovir 6/22 >>  Ampicillin 6/22 >>  Ceftriaxone 6/22 >>  Vancomycin 6/22 >>   Dose adjustments this admission: N/A  Microbiology results: 6/22 BCx: pending  6/22 UCx: pending   6/22 Sputum: pending  6/22 MRSA PCR: pending  6/22 CSF: pending   Pharmacy will continue to monitor and adjust per consult.    Cathy Harrington 04/24/2016 2:44 PM

## 2016-04-24 NOTE — ED Notes (Signed)
Pt belongings given to her husband at this time, including purse.

## 2016-04-24 NOTE — Procedures (Signed)
Central Venous Catheter Insertion Procedure Note - left Internal Jugular Cathy Harrington [id number] Jun 19, 1946  Procedure: Insertion of Central Venous Catheter Indications: Assessment of intravascular volume, Drug and/or fluid administration and Frequent blood sampling  Procedure Details Consent: Unable to obtain consent because of emergent medical necessity. Time Out: Verified patient identification, verified procedure, site/side was marked, verified correct patient position, special equipment/implants available, medications/allergies/relevent history reviewed, required imaging and test results available.  Performed  Maximum sterile technique was used including antiseptics, cap, gloves, gown, hand hygiene, mask and sheet. Skin prep: Chlorhexidine; local anesthetic administered A antimicrobial bonded/coated triple lumen catheter was placed in the left internal jugular vein using the Seldinger technique.  Evaluation Blood flow good Complications: No apparent complications Patient did tolerate procedure well. Chest X-ray ordered to verify placement.  CXR: pending.  Procedure performed under direct ultrasound guidance for real time vessel cannulation.       [place], M.D.  [facility]  Medical Director [place] Director [place] Department

## 2016-04-24 NOTE — H&P (Signed)
PULMONARY / CRITICAL CARE MEDICINE   Name: Cathy Harrington MRN: [id number] DOB: [birth year]    ADMISSION DATE:  04/24/2016 CONSULTATION DATE:  04/24/16  REFERRING MD:  EDP  CHIEF COMPLAINT: Altered Mental Status  HISTORY OF PRESENT ILLNESS:   Cathy Harrington is a 70 year old with past medical history significant for diabetes mellitus and hypothyroidism.Patient presented to ED on 6/22 with altered mental status since Monday. Patient was diagnosed with urinary tract infection and has been taking bactrim since this Monday .Family member was concerned  As he states that she has trouble thinking and speaking.she cannot get her words out and is having difficulty in communicating with the family members, she will start to say something in Arabic which is her native language and switch to [place] and does not make any sense at all. When the patient presented to the ER she appeared to be sweaty and staring patient did not follow commands, it is reported that patient has seizures. She was tachypneic and had a temp of 103.1 axillary.er bladder was distended and in the ER, they did in and out cath and was able to drain 1500 mL's out of bladder.her symptoms were concerning for meningitis /encephalitis and therefore ER performed lumbar puncture and send the specimen to the lab.Patient did not seem to be able to protect the airway and therefore she was intubated and mechanically ventilated for now.Patient was sent to the ICU for further management.  PAST MEDICAL HISTORY :  She  has a past medical history of Thyroid disease and Diabetes mellitus without complication ([place]).  PAST SURGICAL HISTORY: She  has past surgical history that includes Back surgery.  No Known Allergies  No current facility-administered medications on file prior to encounter.   No current outpatient prescriptions on file prior to encounter.    FAMILY HISTORY:  Her has no family status information on file.   SOCIAL HISTORY: She  reports  that she has quit smoking. She does not have any smokeless tobacco history on file. She reports that she does not drink alcohol.  REVIEW OF SYSTEMS:   unnable to obtain as the patient is obtunded  SUBJECTIVE:  Unable to obtain as the patient is obtunded  VITAL SIGNS: BP 138/88 mmHg  Pulse 127  Temp(Src) 103.1 F (39.5 C) (Axillary)  Resp 36  Ht 5\' 3"  (1.6 m)  Wt 180 lb (81.647 kg)  BMI 31.89 kg/m2  SpO2 100%  HEMODYNAMICS:    VENTILATOR SETTINGS:    INTAKE / OUTPUT:    PHYSICAL EXAMINATION: General:  Intubated,sedated Neuro:  Obtunded, gcs<8T HEENT:  Atraumatic,normocephalic, 2+ pupils sluggishly reacting, no JVD appreciated Cardiovascular:  S1 and S2, tachycardia,no MRG noted Lungs: coarse on the left upper lobe,symmetrical expansion, no wheezes or crackles noted Abdomen: obese, active bowel sounds Musculoskeletal:  no inflammation/deformity noted Skin:  No ulcers/rash noted  LABS:  BMET  Recent Labs Lab 04/24/16 0750 04/24/16 1138  NA 126* 130*  K 3.8 3.2*  CL 92* 91*  CO2 23 18*  BUN 17 15  CREATININE 0.94 1.24*  GLUCOSE 149* 141*    Electrolytes  Recent Labs Lab 04/24/16 0750 04/24/16 1138  CALCIUM 8.6* 8.4*  MG  --  1.8  PHOS  --  1.7*    CBC  Recent Labs Lab 04/24/16 0750 04/24/16 1138  WBC 11.6* 20.2*  HGB 13.5 13.3  HCT 38.7 38.6  PLT 226 269    Coag's No results for input(s): APTT, INR in the last 168 hours.  Sepsis Markers  Recent Labs Lab 04/24/16 1138  LATICACIDVEN 1.4    ABG  Recent Labs Lab 04/24/16 1230  PHART 7.42  PCO2ART 35  PO2ART 144*    Liver Enzymes  Recent Labs Lab 04/24/16 0750 04/24/16 1138  AST 29 39  ALT 19 20  ALKPHOS 47 49  BILITOT 0.7 0.5  ALBUMIN 4.2 4.3    Cardiac Enzymes  Recent Labs Lab 04/24/16 0750  TROPONINI <0.03    Glucose No results for input(s): GLUCAP in the last 168 hours.  Imaging Ct Head Wo Contrast  04/24/2016  CLINICAL DATA:  Altered mental  status. EXAM: CT HEAD WITHOUT CONTRAST TECHNIQUE: Contiguous axial images were obtained from the base of the skull through the vertex without intravenous contrast. COMPARISON:  None. FINDINGS: Bony calvarium appears intact. Mild chronic ischemic white matter disease is noted. No mass effect or midline shift is noted. Ventricular size is within normal limits. There is no evidence of mass lesion, hemorrhage or acute infarction. IMPRESSION: Mild chronic ischemic white matter disease. No acute intracranial abnormality seen. Electronically Signed   By: Marijo Conception, M.D.   On: 04/24/2016 08:52     STUDIES:  6/22 LP>>  CULTURES: 6/22 blood culture>> 6/22 urine culture>>   ANTIBIOTICS: 6/22 vancomycin>> 6/22 ceftriaxone>> 6/22 acyclovir>> 6/22 ampicillin>>  SIGNIFICANT EVENTS: 6/22>>Patient presented to [facility] with Altered mental status, febrile and ended up having seizures requiring intubation and now on mechanically ventilated   LINES/TUBES: 6/20 ET tube>>  DISCUSSION: 70 yo female with Hx of DM-Type2, hypothyroidism , UTI now presenting with Altered mental Status, Hyperthermia and one episode of seizures in the ED requiring emergent intubation.    ASSESSMENT / PLAN:  NEUROLOGIC  Seizures ?Encephalitis/ meningitis Altered mental status P:    RASS goal: 0- -1 Neurology consult appreciated Vanc/.ceftriaxone/ ampicillin/ acyclovir Continue Kepra  6/22 LP performed, CSF concerning for elevated protein MRI once patient is stable  PULMONARY A: Acute hypoxemic/hypercarbic respiratory failure related to seizures ?Aspiration  P:   Full vent support SBT trial in am Fentanyl/ Versed as needed  CARDIOVASCULAR A:  No active issues P:  Continuous ICU monitoring Keep MAP goals>65  RENAL A:  Acute kidney Injury Hyponatremia hypokalemia UTI P:   Replace electrolytes per ICU protocol Follow chemistry  GASTROINTESTINAL A:   No active issues P:   NPO for now Will  start TF on 6/23 Pepcid for GI prohylaxis  HEMATOLOGIC A:   No active issues P:  scds lovenox for DVT prophylaxis Transfuse if Hgb<7  INFECTIOUS A:   Leucocytosis Meningitis P:   Follow cultures Continue vancomycin/ ceftriaxone, acylovir/ ampicillin  ENDOCRINE A:   Diabetese Melitus  Hypothyroidism P:   Blood sugar checks SSI coverage Continue Synthroid    Bincy Varughese,AG-ACNP [facility]   04/24/2016, 1:12 PM  STAFF NOTE: I, Dr. Corrin Parker,  have personally reviewed patient's available data, including medical history, events of note, physical examination and test results as part of my evaluation. I have discussed with NP and other care providers such as pharmacist, RN and RRT.  In addition,  I personally evaluated patient and elicited key findings  Obtunded,sedated on vent, remains febrile   A:acute seizure with fevers possible encephalitis/meningitis  P: follow up neuro recs Sedation as needed Empiric abx therapy    The Rest per NP whose note is outlined above and that I agree with  I have personally reviewed/obtained a history, examined the patient, evaluated Pertinent laboratory  and RadioGraphic/imaging results, and  formulated the assessment and plan   The Patient requires high complexity decision making for assessment and support, frequent evaluation and titration of therapies, application of advanced monitoring technologies and extensive interpretation of multiple databases. Critical Care Time devoted to patient care services described in this note is 55 minutes.  This Critical care time does not reflrect procedure time or supervisory time of NP but could involve care discussion time Overall, patient is critically ill, prognosis is guarded.  Patient with Multiorgan failure and at high risk for cardiac arrest and death.    Corrin Parker, M.D.  [facility]  Medical  Director [place] Director [facility] Cardio-Pulmonary Department

## 2016-04-24 NOTE — ED Notes (Addendum)
Patient presents with c/o AMS since Monday. Patient on ABX for UTI. Husband concerned because patient is having trouble thinking and speaking; "She cannot get her words out, she cant think, she is not communicating ineffectively". Husband says that patient starts to say something, but never completes her thought or sentence.  Patient totally disoriented; not oriented to person, time, place, or task. Patient switches from [place] to [place] when speaking.

## 2016-04-24 NOTE — Progress Notes (Signed)
CXR shows tube in right mainstem,withdrawn to 24 cm mark

## 2016-04-25 ENCOUNTER — Inpatient Hospital Stay: Payer: Medicare Other

## 2016-04-25 DIAGNOSIS — B004 Herpesviral encephalitis: Secondary | ICD-10-CM

## 2016-04-25 LAB — CBC
HEMATOCRIT: 31.7 % — AB (ref 35.0–47.0)
HEMOGLOBIN: 10.8 g/dL — AB (ref 12.0–16.0)
MCH: 28.1 pg (ref 26.0–34.0)
MCHC: 34 g/dL (ref 32.0–36.0)
MCV: 82.8 fL (ref 80.0–100.0)
Platelets: 203 10*3/uL (ref 150–440)
RBC: 3.83 MIL/uL (ref 3.80–5.20)
RDW: 14.3 % (ref 11.5–14.5)
WBC: 11 10*3/uL (ref 3.6–11.0)

## 2016-04-25 LAB — PHOSPHORUS
PHOSPHORUS: 2.5 mg/dL (ref 2.5–4.6)
Phosphorus: 2.2 mg/dL — ABNORMAL LOW (ref 2.5–4.6)

## 2016-04-25 LAB — GLUCOSE, CAPILLARY
GLUCOSE-CAPILLARY: 137 mg/dL — AB (ref 65–99)
GLUCOSE-CAPILLARY: 127 mg/dL — AB (ref 65–99)
GLUCOSE-CAPILLARY: 162 mg/dL — AB (ref 65–99)
Glucose-Capillary: 124 mg/dL — ABNORMAL HIGH (ref 65–99)
Glucose-Capillary: 161 mg/dL — ABNORMAL HIGH (ref 65–99)

## 2016-04-25 LAB — URINE CULTURE
CULTURE: NO GROWTH
SPECIAL REQUESTS: NORMAL

## 2016-04-25 LAB — BLOOD GAS, ARTERIAL
ACID-BASE DEFICIT: 0.5 mmol/L (ref 0.0–2.0)
ALLENS TEST (PASS/FAIL): POSITIVE — AB
BICARBONATE: 22.6 meq/L (ref 21.0–28.0)
FIO2: 0.4
MECHANICAL RATE: 16
MECHVT: 500 mL
O2 SAT: 97.9 %
PATIENT TEMPERATURE: 37
PEEP: 5 cmH2O
PO2 ART: 96 mmHg (ref 83.0–108.0)
pCO2 arterial: 31 mmHg — ABNORMAL LOW (ref 32.0–48.0)
pH, Arterial: 7.47 — ABNORMAL HIGH (ref 7.350–7.450)

## 2016-04-25 LAB — BASIC METABOLIC PANEL
ANION GAP: 5 (ref 5–15)
BUN: 10 mg/dL (ref 6–20)
CALCIUM: 6.7 mg/dL — AB (ref 8.9–10.3)
CHLORIDE: 104 mmol/L (ref 101–111)
CO2: 23 mmol/L (ref 22–32)
Creatinine, Ser: 0.8 mg/dL (ref 0.44–1.00)
GFR calc non Af Amer: >60 mL/min (ref >60)
GLUCOSE: 131 mg/dL — AB (ref 65–99)
POTASSIUM: 3.5 mmol/L (ref 3.5–5.1)
Sodium: 132 mmol/L — ABNORMAL LOW (ref 135–145)

## 2016-04-25 LAB — LACTIC ACID, PLASMA
Lactic Acid, Venous: 0.9 mmol/L (ref 0.5–2.0)
Lactic Acid, Venous: 0.7 mmol/L (ref 0.5–2.0)

## 2016-04-25 LAB — MAGNESIUM
MAGNESIUM: 2.2 mg/dL (ref 1.7–2.4)
MAGNESIUM: 2.1 mg/dL (ref 1.7–2.4)

## 2016-04-25 LAB — PATHOLOGIST SMEAR REVIEW

## 2016-04-25 LAB — HIV ANTIBODY (ROUTINE TESTING W REFLEX): HIV Screen 4th Generation wRfx: NONREACTIVE

## 2016-04-25 MED ORDER — INSULIN ASPART 100 UNIT/ML ~~LOC~~ SOLN
2.0000 [IU] | SUBCUTANEOUS | Status: DC
  Administered 2016-04-25: 4 [IU] via SUBCUTANEOUS
  Administered 2016-04-26 (×2): 2 [IU] via SUBCUTANEOUS
  Administered 2016-04-26: 4 [IU] via SUBCUTANEOUS
  Administered 2016-04-27: 2 [IU] via SUBCUTANEOUS
  Administered 2016-04-28: 4 [IU] via SUBCUTANEOUS
  Administered 2016-04-28: 2 [IU] via SUBCUTANEOUS
  Administered 2016-04-28: 6 [IU] via SUBCUTANEOUS
  Administered 2016-04-28: 2 [IU] via SUBCUTANEOUS
  Filled 2016-04-25: qty 6
  Filled 2016-04-25 (×3): qty 2
  Filled 2016-04-25 (×2): qty 4
  Filled 2016-04-25: qty 2
  Filled 2016-04-25: qty 4
  Filled 2016-04-25: qty 2

## 2016-04-25 MED ORDER — PRO-STAT SUGAR FREE PO LIQD
30.0000 mL | Freq: Every day | ORAL | Status: DC
  Administered 2016-04-25: 30 mL

## 2016-04-25 MED ORDER — FREE WATER
100.0000 mL | Freq: Three times a day (TID) | Status: DC
  Administered 2016-04-25 – 2016-04-26 (×3): 100 mL

## 2016-04-25 MED ORDER — SODIUM CHLORIDE 0.9 % IV SOLN
2.0000 g | Freq: Once | INTRAVENOUS | Status: AC
  Administered 2016-04-25: 2 g via INTRAVENOUS
  Filled 2016-04-25: qty 20

## 2016-04-25 MED ORDER — FAMOTIDINE 20 MG PO TABS
20.0000 mg | ORAL_TABLET | Freq: Two times a day (BID) | ORAL | Status: DC
  Administered 2016-04-25 – 2016-05-02 (×10): 20 mg via ORAL
  Filled 2016-04-25 (×12): qty 1

## 2016-04-25 MED ORDER — POTASSIUM CHLORIDE 10 MEQ/50ML IV SOLN
10.0000 meq | INTRAVENOUS | Status: AC
  Administered 2016-04-25 (×2): 10 meq via INTRAVENOUS
  Filled 2016-04-25 (×2): qty 50

## 2016-04-25 MED ORDER — VANCOMYCIN HCL IN DEXTROSE 750-5 MG/150ML-% IV SOLN
750.0000 mg | Freq: Two times a day (BID) | INTRAVENOUS | Status: DC
  Administered 2016-04-25 – 2016-04-27 (×5): 750 mg via INTRAVENOUS
  Filled 2016-04-25 (×6): qty 150

## 2016-04-25 MED ORDER — GADOBENATE DIMEGLUMINE 529 MG/ML IV SOLN
20.0000 mL | Freq: Once | INTRAVENOUS | Status: AC | PRN
  Administered 2016-04-25: 17 mL via INTRAVENOUS

## 2016-04-25 MED ORDER — DEXTROSE 5 % IV SOLN
2.0000 g | Freq: Two times a day (BID) | INTRAVENOUS | Status: DC
  Administered 2016-04-25 – 2016-04-28 (×6): 2 g via INTRAVENOUS
  Filled 2016-04-25 (×7): qty 2

## 2016-04-25 MED ORDER — DEXTROSE 5 % IV SOLN
800.0000 mg | Freq: Three times a day (TID) | INTRAVENOUS | Status: DC
  Administered 2016-04-25 – 2016-05-02 (×20): 800 mg via INTRAVENOUS
  Filled 2016-04-25 (×27): qty 16

## 2016-04-25 MED ORDER — VITAL HIGH PROTEIN PO LIQD
1000.0000 mL | ORAL | Status: DC
  Administered 2016-04-25: 1000 mL

## 2016-04-25 MED ORDER — INSULIN ASPART 100 UNIT/ML ~~LOC~~ SOLN: 0.0000 [IU] | SUBCUTANEOUS | Status: DC

## 2016-04-25 NOTE — Progress Notes (Signed)
Pharmacy Antibiotic Note  Cathy Harrington is a 70 y.o. female admitted on 04/24/2016 with meningitis.  Pharmacy has been consulted for vancomycin and ceftriaxone dosing.  Plan: Vancomycin 750 IV every 12 hours. Trough with the 5th dose. Goal trough 15-20 mcg/mL. Ceftriaxone 2 g iv q 12 hours.  Will f/u culture results. Suspected viral meningitis so can Harrington/c abx if culture negative per ID.   Height: 5\' 3"  (160 cm) Weight: 181 lb 14.1 oz (82.5 kg) IBW/kg (Calculated) : 52.4  Temp (24hrs), Avg:101.3 F (38.5 C), Min:100.8 F (38.2 C), Max:102 F (38.9 C)   Recent Labs Lab 04/24/16 0750 04/24/16 1138 04/24/16 1405 04/25/16 0410 04/25/16 1100  WBC 11.6* 20.2*  --  11.0  --   CREATININE 0.94 1.24*  --  0.80  --   LATICACIDVEN  --  1.4 1.4  --  0.9    Estimated Creatinine Clearance: 67.5 mL/min (by C-G formula based on Cr of 0.8).    No Known Allergies  Antimicrobials this admission: acyclovir 6/22 >>  ampicillin 6/22 >> 6/23 Ceftriaxone 6/22 >> vancomycin 6/22 >>  Dose adjustments this admission:  Microbiology results: 6/22 BCx: NGTD 6/22 UCx: NG  6/22 CSF: NGTD 6/22 Sputum: NGTD  6/22 MRSA PCR: negative  Thank you for allowing pharmacy to be a part of this patient's care.  Cathy Harrington 04/25/2016 4:15 PM

## 2016-04-25 NOTE — Progress Notes (Signed)
[facility] Consultation      Name: Cathy Harrington MRN: [id number] DOB: 1946-02-07    ADMISSION DATE:  04/24/2016  CHIEF COMPLAINT:   Acute resp failure from acute seizures   HISTORY OF PRESENT ILLNESS  Remains intubated, sedated Will attempt SAT/SBT today Assess neuro status  LP resutlrs likely c/wpossible viral meningitis aseptic       Review of Systems  Unable to perform ROS: critical illness      VITAL SIGNS    Temp:  [100.8 F (38.2 C)-103.1 F (39.5 C)] 101.8 F (38.8 C) (06/23 0400) Pulse Rate:  [69-146] 73 (06/23 0700) Resp:  [7-42] 16 (06/23 0700) BP: (71-194)/(44-125) 96/53 mmHg (06/23 0700) SpO2:  [94 %-100 %] 99 % (06/23 0726) FiO2 (%):  [40 %-50 %] 40 % (06/23 0726) Weight:  [181 lb 14.1 oz (82.5 kg)-183 lb 10.3 oz (83.3 kg)] 181 lb 14.1 oz (82.5 kg) (06/23 0500) HEMODYNAMICS:   VENTILATOR SETTINGS: Vent Mode:  [-] PRVC FiO2 (%):  [40 %-50 %] 40 % Set Rate:  [16 bmp] 16 bmp Vt Set:  [500 mL] 500 mL PEEP:  [5 cmH20] 5 cmH20 INTAKE / OUTPUT:  Intake/Output Summary (Last 24 hours) at 04/25/16 1696 Last data filed at 04/25/16 0600  Gross per 24 hour  Intake 2780.16 ml  Output   3750 ml  Net -969.84 ml       PHYSICAL EXAM   Physical Exam  Constitutional: She appears distressed.  HENT:  Head: Normocephalic and atraumatic.  Eyes: Pupils are equal, round, and reactive to light. No scleral icterus.  Neck: Normal range of motion. Neck supple.  Cardiovascular: Normal rate and regular rhythm.   No murmur heard. Pulmonary/Chest: She is in respiratory distress. She has no wheezes. She has rales.  resp distress  Abdominal: Soft. She exhibits no distension. There is no tenderness.  Musculoskeletal: She exhibits no edema.  Neurological: She displays normal reflexes. Coordination normal.  gcs<8T  Skin: Skin is warm. No rash noted. She is diaphoretic.       LABS   LABS:  CBC  Recent Labs Lab 04/24/16 0750  04/24/16 1138 04/25/16 0410  WBC 11.6* 20.2* 11.0  HGB 13.5 13.3 10.8*  HCT 38.7 38.6 31.7*  PLT 226 269 203   Coag's No results for input(s): APTT, INR in the last 168 hours. BMET  Recent Labs Lab 04/24/16 0750 04/24/16 1138 04/25/16 0410  NA 126* 130* 132*  K 3.8 3.2* 3.5  CL 92* 91* 104  CO2 23 18* 23  BUN '17 15 10  '$ CREATININE 0.94 1.24* 0.80  GLUCOSE 149* 141* 131*   Electrolytes  Recent Labs Lab 04/24/16 0750 04/24/16 1138 04/25/16 0410  CALCIUM 8.6* 8.4* 6.7*  MG  --  1.8 2.2  PHOS  --  1.7* 2.5   Sepsis Markers  Recent Labs Lab 04/24/16 1138 04/24/16 1405  LATICACIDVEN 1.4 1.4   ABG  Recent Labs Lab 04/24/16 1230 04/25/16 0451  PHART 7.42 7.47*  PCO2ART 35 31*  PO2ART 144* 96   Liver Enzymes  Recent Labs Lab 04/24/16 0750 04/24/16 1138  AST 29 39  ALT 19 20  ALKPHOS 47 49  BILITOT 0.7 0.5  ALBUMIN 4.2 4.3   Cardiac Enzymes  Recent Labs Lab 04/24/16 0750  TROPONINI <0.03   Glucose  Recent Labs Lab 04/24/16 1750 04/24/16 2035 04/25/16 0534  GLUCAP 165* 158* 137*     Recent Results (from the past 240 hour(s))  CSF culture  Status: None (Preliminary result)   Collection Time: 04/24/16 11:38 AM  Result Value Ref Range Status   Specimen Description CSF  Final   Special Requests Normal  Final   Gram Stain RARE WBC SEEN NO ORGANISMS SEEN   Final   Culture PENDING  Incomplete   Report Status PENDING  Incomplete  MRSA PCR Screening     Status: None   Collection Time: 04/24/16  3:20 PM  Result Value Ref Range Status   MRSA by PCR NEGATIVE NEGATIVE Final    Comment:        The GeneXpert MRSA Assay (FDA approved for NASAL specimens only), is one component of a comprehensive MRSA colonization surveillance program. It is not intended to diagnose MRSA infection nor to guide or monitor treatment for MRSA infections.   Culture, respiratory (NON-Expectorated)     Status: None (Preliminary result)   Collection Time:  04/24/16  4:31 PM  Result Value Ref Range Status   Specimen Description TRACHEAL ASPIRATE  Final   Special Requests NONE  Final   Gram Stain   Final    ABUNDANT WBC PRESENT,BOTH PMN AND MONONUCLEAR FEW GRAM POSITIVE COCCI IN PAIRS RARE GRAM VARIABLE ROD Performed at [facility]    Culture PENDING  Incomplete   Report Status PENDING  Incomplete     Current facility-administered medications:  .  0.9 %  sodium chloride infusion, 250 mL, Intravenous, PRN, Flora Lipps, MD .  0.9 %  sodium chloride infusion, 250 mL, Intravenous, PRN, Flora Lipps, MD .  0.9 %  sodium chloride infusion, , Intravenous, Continuous, Bincy S Varughese, NP, Last Rate: 100 mL/hr at 04/24/16 2134 .  acetaminophen (TYLENOL) tablet 650 mg, 650 mg, Oral, Q4H PRN, Flora Lipps, MD, 650 mg at 04/25/16 0313 .  acyclovir (ZOVIRAX) 800 mg in dextrose 5 % 150 mL IVPB, 800 mg, Intravenous, Q12H, Flora Lipps, MD, 800 mg at 04/24/16 2231 .  ampicillin (OMNIPEN) 2 g in sodium chloride 0.9 % 50 mL IVPB, 2 g, Intravenous, Q4H, Flora Lipps, MD, 2 g at 04/25/16 0543 .  antiseptic oral rinse solution (CORINZ), 7 mL, Mouth Rinse, 10 times per day, Flora Lipps, MD, 7 mL at 04/25/16 0551 .  cefTRIAXone (ROCEPHIN) 2 g in dextrose 5 % 50 mL IVPB, 2 g, Intravenous, Q12H, Flora Lipps, MD, 2 g at 04/24/16 2120 .  chlorhexidine gluconate (SAGE KIT) (PERIDEX) 0.12 % solution 15 mL, 15 mL, Mouth Rinse, BID, Flora Lipps, MD, 15 mL at 04/24/16 2056 .  enoxaparin (LOVENOX) injection 40 mg, 40 mg, Subcutaneous, Q24H, Flora Lipps, MD, 40 mg at 04/24/16 1806 .  famotidine (PEPCID) IVPB 20 mg premix, 20 mg, Intravenous, Q12H, Flora Lipps, MD, 20 mg at 04/24/16 2120 .  fentaNYL (SUBLIMAZE) bolus via infusion 25 mcg, 25 mcg, Intravenous, Q1H PRN, Holley Raring, NP, 25 mcg at 04/24/16 1925 .  fentaNYL (SUBLIMAZE) injection 50 mcg, 50 mcg, Intravenous, Once, Bincy S Varughese, NP, 50 mcg at 04/24/16 1514 .  fentaNYL 2563mg in NS 2517m(1070mml)  infusion-PREMIX, 25-400 mcg/hr, Intravenous, Continuous, Bincy S Varughese, NP, Last Rate: 20 mL/hr at 04/25/16 0014, 200 mcg/hr at 04/25/16 0014 .  insulin aspart (novoLOG) injection 2-6 Units, 2-6 Units, Subcutaneous, Q4H, KurFlora LippsD, 2 Units at 04/25/16 0542 .  levETIRAcetam (KEPPRA) 500 mg in sodium chloride 0.9 % 100 mL IVPB, 500 mg, Intravenous, Q12H, Bincy S Varughese, NP, 500 mg at 04/25/16 0006 .  levothyroxine (SYNTHROID, LEVOTHROID) tablet 100 mcg, 100 mcg, Oral, QAC  breakfast, Holley Raring, NP, 100 mcg at 04/25/16 0542 .  midazolam (VERSED) injection 1 mg, 1 mg, Intravenous, Q2H PRN, Bincy S Varughese, NP, 1 mg at 04/25/16 0538 .  norepinephrine (LEVOPHED) '4mg'$  in D5W 251m premix infusion, 2-50 mcg/min, Intravenous, Titrated, KFlora Lipps MD, Last Rate: 22.5 mL/hr at 04/25/16 0411, 6 mcg/min at 04/25/16 0411 .  ondansetron (ZOFRAN) injection 4 mg, 4 mg, Intravenous, Q6H PRN, KFlora Lipps MD .  potassium chloride 10 mEq in 50 mL *CENTRAL LINE* IVPB, 10 mEq, Intravenous, Q1 Hr x 2, JJavier Glazier MD, 10 mEq at 04/25/16 0651 .  sodium chloride flush (NS) 0.9 % injection 3 mL, 3 mL, Intravenous, Q12H, KFlora Lipps MD, 3 mL at 04/24/16 2135 .  sodium chloride flush (NS) 0.9 % injection 3 mL, 3 mL, Intravenous, PRN, KFlora Lipps MD .  vancomycin (VANCOCIN) IVPB 1000 mg/200 mL premix, 1,000 mg, Intravenous, Q18H, KFlora Lipps MD, 1,000 mg at 04/24/16 1806  IMAGING    Dg Chest 1 View  04/24/2016  CLINICAL DATA:  Status post intubation. EXAM: CHEST 1 VIEW COMPARISON:  None FINDINGS: The ET tube tip is in the right mainstem bronchus. This should be withdrawn by approximately 1-2 cm. The heart size is enlarged and there is diffuse pulmonary vascular congestion. No focal airspace consolidation noted. IMPRESSION: 1. Right mainstem bronchus intubation. These results will be called to the ordering clinician or representative by the Radiologist Assistant, and communication documented in the  PACS or zVision Dashboard. Electronically Signed   By: TKerby MoorsM.D.   On: 04/24/2016 13:25   Dg Abd 1 View  04/24/2016  CLINICAL DATA:  Orogastric tube placement EXAM: ABDOMEN - 1 VIEW COMPARISON:  None. FINDINGS: Orogastric tube with the tip projecting over the stomach. There is no bowel dilatation to suggest obstruction. There is no evidence of pneumoperitoneum, portal venous gas or pneumatosis. There are no pathologic calcifications along the expected course of the ureters. The osseous structures are unremarkable. IMPRESSION: Orogastric tube with the tip projecting over the stomach. Electronically Signed   By: HKathreen Devoid  On: 04/24/2016 21:10   Ct Head Wo Contrast  04/24/2016  CLINICAL DATA:  Altered mental status. EXAM: CT HEAD WITHOUT CONTRAST TECHNIQUE: Contiguous axial images were obtained from the base of the skull through the vertex without intravenous contrast. COMPARISON:  None. FINDINGS: Bony calvarium appears intact. Mild chronic ischemic white matter disease is noted. No mass effect or midline shift is noted. Ventricular size is within normal limits. There is no evidence of mass lesion, hemorrhage or acute infarction. IMPRESSION: Mild chronic ischemic white matter disease. No acute intracranial abnormality seen. Electronically Signed   By: JMarijo Conception M.D.   On: 04/24/2016 08:52   Dg Chest Port 1 View  04/25/2016  CLINICAL DATA:  70year old female ICU patient on vent. EXAM: PORTABLE CHEST 1 VIEW COMPARISON:  Chest radiograph dated 04/24/2016 FINDINGS: Endotracheal tube above the carina. An enteric tube courses towards the upper abdomen, and left IJ central line with tip over central SVC in stable position. There is no focal consolidation, pleural effusion, or pneumothorax. The cardiac silhouette is within normal limits. No acute osseous pathology. IMPRESSION: No acute cardiopulmonary process.  No significant interval change. Electronically Signed   By: AAnner CreteM.D.    On: 04/25/2016 06:12   Dg Chest Port 1 View  04/24/2016  CLINICAL DATA:  Central line placement. EXAM: PORTABLE CHEST 1 VIEW COMPARISON:  04/24/2016 at 13:00 p.m. FINDINGS: Patient  slightly rotated to the left. Endotracheal tube has tip 2.6 cm above the carina. Nasogastric tube courses into the region of the stomach and off the inferior portion of the film. There is been interval placement of a left IJ central venous catheter which has tip obliquely oriented over the SVC just below the level of the carina. Lungs are adequately inflated with mild prominence of the perihilar markings suggesting mild vascular congestion. No definite effusion or pneumothorax. Cardiomediastinal silhouette and remainder of the exam is unchanged. IMPRESSION: Findings suggesting mild vascular congestion. Tubes and lines as described.  No pneumothorax. Electronically Signed   By: Marin Olp M.D.   On: 04/24/2016 21:27      Indwelling Urinary Catheter continued, requirement due to   Reason to continue Indwelling Urinary Catheter for strict Intake/Output monitoring for hemodynamic instability   Central Line continued, requirement due to   Reason to continue Central Line Monitoring of central venous pressure or other hemodynamic parameters   Ventilator continued, requirement due to, resp failure    Ventilator Sedation RASS 0 to -2   STUDIES:  6/22 LP>>  CULTURES: 6/22 blood culture>> 6/22 urine culture>>   ANTIBIOTICS: 6/22 vancomycin>> 6/22 ceftriaxone>> 6/22 acyclovir>> 6/22 ampicillin>>  SIGNIFICANT EVENTS: 6/22>>Patient presented to [facility] with Altered mental status, febrile and ended up having seizures requiring intubation and now on mechanically ventilated  LINES/TUBES: 6/22 ET tube>> 6/22 [facility] CVL>>>  DISCUSSION: 70 yo female with Hx of DM-Type2, hypothyroidism , UTI now presenting with Altered mental Status, Hyperthermia and one episode of seizures in the ED requiring emergent  intubation.MAJOR EVENTS/TEST RESULTS:   INDWELLING DEVICES::  MICRO DATA: MRSA PCR negative Urine  Blood Resp       ASSESSMENT/PLAN   70 yo female with Hx of DM-Type2, hypothyroidism , UTI now presenting with Altered mental Status, Hyperthermia and one episode of seizures in the ED requiring emergent intubation.    ASSESSMENT / PLAN:  NEUROLOGIC  Seizures ?Encephalitis/ meningitis Altered mental status P:   RASS goal: 0- -1 Neurology consult appreciated Vanc/.ceftriaxone/ ampicillin/ acyclovir Continue Kepra  6/22 LP performed, CSF concerning for elevated protein MRI once patient is stable  PULMONARY A: Acute hypoxemic/hypercarbic respiratory failure related to seizures ?Aspiration  P:  Full vent support SBT/SAt today, assess underlying neuro status Fentanyl/ Versed as needed  CARDIOVASCULAR A:  No active issues P:  Continuous ICU monitoring Keep MAP goals>65  RENAL A:  Acute kidney Injury Hyponatremia hypokalemia UTI P:  Replace electrolytes per ICU protocol Follow chemistry  GASTROINTESTINAL A:  No active issues P:  Pepcid for GI prohylaxis  HEMATOLOGIC A:  No active issues P:  scds lovenox for DVT prophylaxis Transfuse if Hgb<7  INFECTIOUS A:  Leucocytosis Meningitis P:  Follow cultures Continue vancomycin/ ceftriaxone, acylovir/ ampicillin  ENDOCRINE A:  Diabetese Melitus  Hypothyroidism P:  Blood sugar checks SSI coverage Continue Synthroid    The Patient requires high complexity decision making for assessment and support, frequent evaluation and titration of therapies, application of advanced monitoring technologies and extensive interpretation of multiple databases. Critical Care Time devoted to patient care services described in this note is 35 minutes.   This Critical care time does not reflrect procedure time or supervisory time of NP but could involve care discussion time Overall,  patient is critically ill, prognosis is guarded. Patient with Multiorgan failure and at high risk for cardiac arrest and death.    Corrin Parker, M.D.  [facility]  Medical Director [facility]  [facility] Cardio-Pulmonary Department

## 2016-04-25 NOTE — Consult Note (Signed)
[facility] Infectious Disease     Reason for Consult: Fever, encephalitis   Referring Physician: Flora Lipps Date of Admission:  04/24/2016   Active Problems:   Seizures ([place])   Acute respiratory failure ([place])   HPI: Cathy Harrington is a 70 y.o. female admitted 6/22 with confusion, fevers for 3 days PTA. IN ED had a GTC seizure, intubated for airway protection.  Per her family she was acting normally until Monday when started to be a little confused, difficulty to keep train of thought. Seen by PCP and started on bactrim for possible UTI. Worsened, became shaky, febrile and came to ED. She had been resistant to coming to ER earlier.  Currently intubated, on fentanyl, on norepi.   No travel, 2 cats at home. No tick bites, some mosquito bites.   Past Medical History  Diagnosis Date  . Thyroid disease   . Diabetes mellitus without complication [facility])    Past Surgical History  Procedure Laterality Date  . Back surgery     Social History  Substance Use Topics  . Smoking status: Former Research scientist (life sciences)  . Smokeless tobacco: Not on file  . Alcohol Use: No   No family history on file.  Allergies: No Known Allergies  Current antibiotics: Antibiotics Given (last 72 hours)    Date/Time Action Medication Dose Rate   04/24/16 1417 Given   ampicillin (OMNIPEN) 2 g in sodium chloride 0.9 % 50 mL IVPB 2 g 150 mL/hr   04/24/16 1618 Given   cefTRIAXone (ROCEPHIN) 1 g in dextrose 5 % 50 mL IVPB 1 g 100 mL/hr   04/24/16 1806 Given   ampicillin (OMNIPEN) 2 g in sodium chloride 0.9 % 50 mL IVPB 2 g 150 mL/hr   04/24/16 1806 Given   vancomycin (VANCOCIN) IVPB 1000 mg/200 mL premix 1,000 mg 200 mL/hr   04/24/16 2120 Given   cefTRIAXone (ROCEPHIN) 2 g in dextrose 5 % 50 mL IVPB 2 g 100 mL/hr   04/24/16 2212 Given   ampicillin (OMNIPEN) 2 g in sodium chloride 0.9 % 50 mL IVPB 2 g 150 mL/hr   04/24/16 2231 Given   acyclovir (ZOVIRAX) 800 mg in dextrose 5 % 150 mL IVPB 800 mg 166 mL/hr   04/25/16  0200 Given   ampicillin (OMNIPEN) 2 g in sodium chloride 0.9 % 50 mL IVPB 2 g 150 mL/hr   04/25/16 0543 Given   ampicillin (OMNIPEN) 2 g in sodium chloride 0.9 % 50 mL IVPB 2 g 150 mL/hr      MEDICATIONS: . acyclovir  800 mg Intravenous Q8H  . antiseptic oral rinse  7 mL Mouth Rinse 10 times per day  . calcium gluconate  2 g Intravenous Once  . chlorhexidine gluconate (SAGE KIT)  15 mL Mouth Rinse BID  . enoxaparin (LOVENOX) injection  40 mg Subcutaneous Q24H  . famotidine (PEPCID) IV  20 mg Intravenous Q12H  . feeding supplement (PRO-STAT SUGAR FREE 64)  30 mL Per Tube Daily  . feeding supplement (VITAL HIGH PROTEIN)  1,000 mL Per Tube Q24H  . fentaNYL (SUBLIMAZE) injection  50 mcg Intravenous Once  . free water  100 mL Per Tube Q8H  . insulin aspart  2-6 Units Subcutaneous Q4H  . levETIRAcetam  500 mg Intravenous Q12H  . levothyroxine  100 mcg Oral QAC breakfast  . sodium chloride flush  3 mL Intravenous Q12H    Review of Systems - unable to obtain   OBJECTIVE: Temp:  [100.8 F (38.2 C)-102 F (  38.9 C)] 102 F (38.9 C) (06/23 0800) Pulse Rate:  [69-123] 123 (06/23 0800) Resp:  [7-20] 14 (06/23 0800) BP: (71-134)/(44-83) 103/72 mmHg (06/23 0800) SpO2:  [99 %-100 %] 100 % (06/23 1043) FiO2 (%):  [40 %-50 %] 40 % (06/23 1043) Weight:  [82.5 kg (181 lb 14.1 oz)] 82.5 kg (181 lb 14.1 oz) (06/23 0500) Physical Exam  Constitutional:  Intubated, wakes up during exam and became agitated, and moving all 4, quite strong, but not following commands,  HENT: [place]/AT, pupils pinpoint Mouth/Throat: ETT Cardiovascular:Tachy Reg Pulmonary/Chest:Mech BS Neck some rigidity Abdominal: Soft. Bowel sounds are normal.  exhibits no distension. There is no tenderness.  Lymphadenopathy: no cervical adenopathy. No axillary adenopathy Neurological: intubated, sedated wakes up during exam and became agitated, and moving all 4, quite strong, but not following commands,  Skin: Skin is warm and dry. No  rash noted. No erythema.  Psychiatric: sedated, intubated   LABS: Results for orders placed or performed during the hospital encounter of 04/24/16 (from the past 48 hour(s))  CBC with Differential     Status: Abnormal   Collection Time: 04/24/16  7:50 AM  Result Value Ref Range   WBC 11.6 (H) 3.6 - 11.0 K/uL   RBC 4.72 3.80 - 5.20 MIL/uL   Hemoglobin 13.5 12.0 - 16.0 g/dL   HCT 38.7 35.0 - 47.0 %   MCV 82.1 80.0 - 100.0 fL   MCH 28.6 26.0 - 34.0 pg   MCHC 34.8 32.0 - 36.0 g/dL   RDW 14.4 11.5 - 14.5 %   Platelets 226 150 - 440 K/uL   Neutrophils Relative % 73% %   Neutro Abs 8.4 (H) 1.4 - 6.5 K/uL   Lymphocytes Relative 12% %   Lymphs Abs 1.4 1.0 - 3.6 K/uL   Monocytes Relative 15% %   Monocytes Absolute 1.8 (H) 0.2 - 0.9 K/uL   Eosinophils Relative 0% %   Eosinophils Absolute 0.0 0 - 0.7 K/uL   Basophils Relative 0% %   Basophils Absolute 0.0 0 - 0.1 K/uL  Comprehensive metabolic panel     Status: Abnormal   Collection Time: 04/24/16  7:50 AM  Result Value Ref Range   Sodium 126 (L) 135 - 145 mmol/L   Potassium 3.8 3.5 - 5.1 mmol/L   Chloride 92 (L) 101 - 111 mmol/L   CO2 23 22 - 32 mmol/L   Glucose, Bld 149 (H) 65 - 99 mg/dL   BUN 17 6 - 20 mg/dL   Creatinine, Ser 0.94 0.44 - 1.00 mg/dL   Calcium 8.6 (L) 8.9 - 10.3 mg/dL   Total Protein 7.2 6.5 - 8.1 g/dL   Albumin 4.2 3.5 - 5.0 g/dL   AST 29 15 - 41 U/L   ALT 19 14 - 54 U/L   Alkaline Phosphatase 47 38 - 126 U/L   Total Bilirubin 0.7 0.3 - 1.2 mg/dL   GFR calc non Af Amer >60 >60 mL/min   GFR calc Af Amer >60 >60 mL/min    Comment: (NOTE) The eGFR has been calculated using the CKD EPI equation. This calculation has not been validated in all clinical situations. eGFR's persistently <60 mL/min signify possible Chronic Kidney Disease.    Anion gap 11 5 - 15  Troponin I     Status: None   Collection Time: 04/24/16  7:50 AM  Result Value Ref Range   Troponin I <0.03 <0.031 ng/mL    Comment:        NO  INDICATION OF MYOCARDIAL INJURY.   T4, free     Status: Abnormal   Collection Time: 04/24/16  7:50 AM  Result Value Ref Range   Free T4 1.52 (H) 0.61 - 1.12 ng/dL    Comment: (NOTE) Biotin ingestion may interfere with free T4 tests. If the results are inconsistent with the TSH level, previous test results, or the clinical presentation, then consider biotin interference. If needed, order repeat testing after stopping biotin.   Urinalysis complete, with microscopic     Status: Abnormal   Collection Time: 04/24/16 10:45 AM  Result Value Ref Range   Color, Urine YELLOW (A) YELLOW   APPearance CLEAR (A) CLEAR   Glucose, UA 50 (A) NEGATIVE mg/dL   Bilirubin Urine NEGATIVE NEGATIVE   Ketones, ur NEGATIVE NEGATIVE mg/dL   Specific Gravity, Urine 1.012 1.005 - 1.030   Hgb urine dipstick NEGATIVE NEGATIVE   pH 6.0 5.0 - 8.0   Protein, ur NEGATIVE NEGATIVE mg/dL   Nitrite NEGATIVE NEGATIVE   Leukocytes, UA NEGATIVE NEGATIVE   RBC / HPF 0-5 0 - 5 RBC/hpf   WBC, UA 0-5 0 - 5 WBC/hpf   Bacteria, UA RARE (A) NONE SEEN   Squamous Epithelial / LPF NONE SEEN NONE SEEN   Mucous PRESENT   Blood culture (routine x 2)     Status: None (Preliminary result)   Collection Time: 04/24/16 11:38 AM  Result Value Ref Range   Specimen Description BLOOD RIGHT ASSIST CONTROL    Special Requests      BOTTLES DRAWN AEROBIC AND ANAEROBIC  AERO 20CC ANA [place]   Culture NO GROWTH 1 DAY    Report Status PENDING   Lactic acid, plasma     Status: None   Collection Time: 04/24/16 11:38 AM  Result Value Ref Range   Lactic Acid, Venous 1.4 0.5 - 2.0 mmol/L  CSF cell count with differential collection tube #: 1     Status: Abnormal   Collection Time: 04/24/16 11:38 AM  Result Value Ref Range   Tube # 1    Color, CSF PINK (A) COLORLESS   Appearance, CSF HAZY (A) CLEAR   Supernatant CLEAR    RBC Count, CSF 1361 (H) 0 - 3 /cu mm   WBC, CSF 49 /cu mm   Segmented Neutrophils-CSF 10 %   Lymphs, CSF 56 %    Monocyte-Macrophage-Spinal Fluid 34 %   Eosinophils, CSF 0 %  CSF cell count with differential collection tube #: 4     Status: Abnormal   Collection Time: 04/24/16 11:38 AM  Result Value Ref Range   Tube # 4    Color, CSF PINK (A) COLORLESS   Appearance, CSF CLEAR (A) CLEAR   Supernatant CLEAR    RBC Count, CSF 216 (H) 0 - 3 /cu mm   WBC, CSF 51 /cu mm   Segmented Neutrophils-CSF 16 %   Lymphs, CSF 51 %   Monocyte-Macrophage-Spinal Fluid 33 %   Eosinophils, CSF 0 %  CSF culture     Status: None (Preliminary result)   Collection Time: 04/24/16 11:38 AM  Result Value Ref Range   Specimen Description CSF    Special Requests Normal    Gram Stain RARE WBC SEEN NO ORGANISMS SEEN     Culture      NO GROWTH < 24 HOURS Performed at [facility]    Report Status PENDING   Protein and glucose, CSF     Status: Abnormal   Collection Time: 04/24/16  11:38 AM  Result Value Ref Range   Glucose, CSF 59 40 - 70 mg/dL   Total  Protein, CSF 73 (H) 15 - 45 mg/dL  Comprehensive metabolic panel     Status: Abnormal   Collection Time: 04/24/16 11:38 AM  Result Value Ref Range   Sodium 130 (L) 135 - 145 mmol/L    Comment: LYTES REPEATED   Potassium 3.2 (L) 3.5 - 5.1 mmol/L   Chloride 91 (L) 101 - 111 mmol/L   CO2 18 (L) 22 - 32 mmol/L   Glucose, Bld 141 (H) 65 - 99 mg/dL   BUN 15 6 - 20 mg/dL   Creatinine, Ser 1.24 (H) 0.44 - 1.00 mg/dL   Calcium 8.4 (L) 8.9 - 10.3 mg/dL   Total Protein 7.5 6.5 - 8.1 g/dL   Albumin 4.3 3.5 - 5.0 g/dL   AST 39 15 - 41 U/L   ALT 20 14 - 54 U/L   Alkaline Phosphatase 49 38 - 126 U/L   Total Bilirubin 0.5 0.3 - 1.2 mg/dL   GFR calc non Af Amer 43 (L) >60 mL/min   GFR calc Af Amer 50 (L) >60 mL/min    Comment: (NOTE) The eGFR has been calculated using the CKD EPI equation. This calculation has not been validated in all clinical situations. eGFR's persistently <60 mL/min signify possible Chronic Kidney Disease.    Anion gap 21 (H) 5 - 15  CBC with  Differential     Status: Abnormal   Collection Time: 04/24/16 11:38 AM  Result Value Ref Range   WBC 20.2 (H) 3.6 - 11.0 K/uL   RBC 4.53 3.80 - 5.20 MIL/uL   Hemoglobin 13.3 12.0 - 16.0 g/dL   HCT 38.6 35.0 - 47.0 %   MCV 85.3 80.0 - 100.0 fL   MCH 29.4 26.0 - 34.0 pg   MCHC 34.4 32.0 - 36.0 g/dL   RDW 14.5 11.5 - 14.5 %   Platelets 269 150 - 440 K/uL   Neutrophils Relative % 58 %   Lymphocytes Relative 26 %   Monocytes Relative 16 %   Eosinophils Relative 0 %   Basophils Relative 0 %   Band Neutrophils 0 %   Metamyelocytes Relative 0 %   Myelocytes 0 %   Promyelocytes Absolute 0 %   Blasts 0 %   nRBC 0 0 /100 WBC   Other 0 %   Neutro Abs 11.7 (H) 1.4 - 6.5 K/uL   Lymphs Abs 5.3 (H) 1.0 - 3.6 K/uL   Monocytes Absolute 3.2 (H) 0.2 - 0.9 K/uL   Eosinophils Absolute 0.0 0 - 0.7 K/uL   Basophils Absolute 0.0 0 - 0.1 K/uL   Smear Review MORPHOLOGY UNREMARKABLE   HIV antibody     Status: None   Collection Time: 04/24/16 11:38 AM  Result Value Ref Range   HIV Screen 4th Generation wRfx Non Reactive Non Reactive    Comment: (NOTE) Performed At: [facility] [place], [place] [id number] Lindon Romp MD LK:[id number]   Culture, blood (routine x 2)     Status: None (Preliminary result)   Collection Time: 04/24/16 11:38 AM  Result Value Ref Range   Specimen Description BLOOD RIGHT ASSIST CONTROL    Special Requests      BOTTLES DRAWN AEROBIC AND ANAEROBIC  AERO 10CC ANA [place]   Culture NO GROWTH < 24 HOURS    Report Status PENDING   Magnesium     Status: None  Collection Time: 04/24/16 11:38 AM  Result Value Ref Range   Magnesium 1.8 1.7 - 2.4 mg/dL  Phosphorus     Status: Abnormal   Collection Time: 04/24/16 11:38 AM  Result Value Ref Range   Phosphorus 1.7 (L) 2.5 - 4.6 mg/dL  Pathologist smear review     Status: None   Collection Time: 04/24/16 11:38 AM  Result Value Ref Range   Path Review Smear of CSF reviewed.     Comment: Numerous RBCs  consistent with traumatic tap, interpret differential counts with caution. Negative for malignancy. Reviewed by Dellia Nims Reuel Derby, M.D.   Blood gas, arterial     Status: Abnormal   Collection Time: 04/24/16 12:30 PM  Result Value Ref Range   FIO2 100.00    Delivery systems NON-REBREATHER OXYGEN MASK    pH, Arterial 7.42 7.350 - 7.450   pCO2 arterial 35 32.0 - 48.0 mmHg   pO2, Arterial 144 (H) 83.0 - 108.0 mmHg   Bicarbonate 22.7 21.0 - 28.0 mEq/L   Acid-base deficit 1.3 0.0 - 2.0 mmol/L   O2 Saturation 99.3 %   Patient temperature 37.0    Collection site RIGHT RADIAL    Sample type ARTERIAL DRAW    Allens test (pass/fail) POSITIVE (A) PASS  Lactic acid, plasma     Status: None   Collection Time: 04/24/16  2:05 PM  Result Value Ref Range   Lactic Acid, Venous 1.4 0.5 - 2.0 mmol/L  MRSA PCR Screening     Status: None   Collection Time: 04/24/16  3:20 PM  Result Value Ref Range   MRSA by PCR NEGATIVE NEGATIVE    Comment:        The GeneXpert MRSA Assay (FDA approved for NASAL specimens only), is one component of a comprehensive MRSA colonization surveillance program. It is not intended to diagnose MRSA infection nor to guide or monitor treatment for MRSA infections.   Culture, respiratory (NON-Expectorated)     Status: None (Preliminary result)   Collection Time: 04/24/16  4:31 PM  Result Value Ref Range   Specimen Description TRACHEAL ASPIRATE    Special Requests NONE    Gram Stain      ABUNDANT WBC PRESENT,BOTH PMN AND MONONUCLEAR FEW GRAM POSITIVE COCCI IN PAIRS RARE GRAM VARIABLE ROD    Culture      NO GROWTH < 24 HOURS Performed at [facility]    Report Status PENDING   Glucose, capillary     Status: Abnormal   Collection Time: 04/24/16  5:50 PM  Result Value Ref Range   Glucose-Capillary 165 (H) 65 - 99 mg/dL  Glucose, capillary     Status: Abnormal   Collection Time: 04/24/16  8:35 PM  Result Value Ref Range   Glucose-Capillary 158 (H) 65 - 99  mg/dL  CBC     Status: Abnormal   Collection Time: 04/25/16  4:10 AM  Result Value Ref Range   WBC 11.0 3.6 - 11.0 K/uL   RBC 3.83 3.80 - 5.20 MIL/uL   Hemoglobin 10.8 (L) 12.0 - 16.0 g/dL   HCT 31.7 (L) 35.0 - 47.0 %   MCV 82.8 80.0 - 100.0 fL   MCH 28.1 26.0 - 34.0 pg   MCHC 34.0 32.0 - 36.0 g/dL   RDW 14.3 11.5 - 14.5 %   Platelets 203 150 - 440 K/uL  Basic metabolic panel     Status: Abnormal   Collection Time: 04/25/16  4:10 AM  Result Value Ref Range  Sodium 132 (L) 135 - 145 mmol/L   Potassium 3.5 3.5 - 5.1 mmol/L   Chloride 104 101 - 111 mmol/L   CO2 23 22 - 32 mmol/L   Glucose, Bld 131 (H) 65 - 99 mg/dL   BUN 10 6 - 20 mg/dL   Creatinine, Ser 0.80 0.44 - 1.00 mg/dL   Calcium 6.7 (L) 8.9 - 10.3 mg/dL   GFR calc non Af Amer >60 >60 mL/min   GFR calc Af Amer >60 >60 mL/min    Comment: (NOTE) The eGFR has been calculated using the CKD EPI equation. This calculation has not been validated in all clinical situations. eGFR's persistently <60 mL/min signify possible Chronic Kidney Disease.    Anion gap 5 5 - 15  Phosphorus     Status: None   Collection Time: 04/25/16  4:10 AM  Result Value Ref Range   Phosphorus 2.5 2.5 - 4.6 mg/dL  Magnesium     Status: None   Collection Time: 04/25/16  4:10 AM  Result Value Ref Range   Magnesium 2.2 1.7 - 2.4 mg/dL  Blood gas, arterial     Status: Abnormal   Collection Time: 04/25/16  4:51 AM  Result Value Ref Range   FIO2 0.40    Delivery systems VENTILATOR    VT 500 mL   Peep/cpap 5.0 cm H20   pH, Arterial 7.47 (H) 7.350 - 7.450   pCO2 arterial 31 (L) 32.0 - 48.0 mmHg   pO2, Arterial 96 83.0 - 108.0 mmHg   Bicarbonate 22.6 21.0 - 28.0 mEq/L   Acid-base deficit 0.5 0.0 - 2.0 mmol/L   O2 Saturation 97.9 %   Patient temperature 37.0    Collection site RIGHT RADIAL    Sample type ARTERIAL DRAW    Allens test (pass/fail) POSITIVE (A) PASS   Mechanical Rate 16   Glucose, capillary     Status: Abnormal   Collection Time:  04/25/16  5:34 AM  Result Value Ref Range   Glucose-Capillary 137 (H) 65 - 99 mg/dL  Glucose, capillary     Status: Abnormal   Collection Time: 04/25/16  8:11 AM  Result Value Ref Range   Glucose-Capillary 127 (H) 65 - 99 mg/dL  Lactic acid, plasma     Status: None   Collection Time: 04/25/16 11:00 AM  Result Value Ref Range   Lactic Acid, Venous 0.9 0.5 - 2.0 mmol/L  Magnesium     Status: None   Collection Time: 04/25/16  2:15 PM  Result Value Ref Range   Magnesium 2.1 1.7 - 2.4 mg/dL  Phosphorus     Status: Abnormal   Collection Time: 04/25/16  2:15 PM  Result Value Ref Range   Phosphorus 2.2 (L) 2.5 - 4.6 mg/dL   No components found for: ESR, C REACTIVE PROTEIN MICRO: Recent Results (from the past 720 hour(s))  Blood culture (routine x 2)     Status: None (Preliminary result)   Collection Time: 04/24/16 11:38 AM  Result Value Ref Range Status   Specimen Description BLOOD RIGHT ASSIST CONTROL  Final   Special Requests   Final    BOTTLES DRAWN AEROBIC AND ANAEROBIC  AERO 20CC ANA [place]   Culture NO GROWTH 1 DAY  Final   Report Status PENDING  Incomplete  CSF culture     Status: None (Preliminary result)   Collection Time: 04/24/16 11:38 AM  Result Value Ref Range Status   Specimen Description CSF  Final   Special Requests Normal  Final  Gram Stain RARE WBC SEEN NO ORGANISMS SEEN   Final   Culture   Final    NO GROWTH < 24 HOURS Performed at [facility]    Report Status PENDING  Incomplete  Culture, blood (routine x 2)     Status: None (Preliminary result)   Collection Time: 04/24/16 11:38 AM  Result Value Ref Range Status   Specimen Description BLOOD RIGHT ASSIST CONTROL  Final   Special Requests   Final    BOTTLES DRAWN AEROBIC AND ANAEROBIC  AERO 10CC ANA [place]   Culture NO GROWTH < 24 HOURS  Final   Report Status PENDING  Incomplete  MRSA PCR Screening     Status: None   Collection Time: 04/24/16  3:20 PM  Result Value Ref Range Status   MRSA by PCR  NEGATIVE NEGATIVE Final    Comment:        The GeneXpert MRSA Assay (FDA approved for NASAL specimens only), is one component of a comprehensive MRSA colonization surveillance program. It is not intended to diagnose MRSA infection nor to guide or monitor treatment for MRSA infections.   Culture, respiratory (NON-Expectorated)     Status: None (Preliminary result)   Collection Time: 04/24/16  4:31 PM  Result Value Ref Range Status   Specimen Description TRACHEAL ASPIRATE  Final   Special Requests NONE  Final   Gram Stain   Final    ABUNDANT WBC PRESENT,BOTH PMN AND MONONUCLEAR FEW GRAM POSITIVE COCCI IN PAIRS RARE GRAM VARIABLE ROD    Culture   Final    NO GROWTH < 24 HOURS Performed at [facility]    Report Status PENDING  Incomplete    IMAGING: Dg Chest 1 View  04/24/2016  CLINICAL DATA:  Status post intubation. EXAM: CHEST 1 VIEW COMPARISON:  None FINDINGS: The ET tube tip is in the right mainstem bronchus. This should be withdrawn by approximately 1-2 cm. The heart size is enlarged and there is diffuse pulmonary vascular congestion. No focal airspace consolidation noted. IMPRESSION: 1. Right mainstem bronchus intubation. These results will be called to the ordering clinician or representative by the Radiologist Assistant, and communication documented in the PACS or zVision Dashboard. Electronically Signed   By: Kerby Moors M.D.   On: 04/24/2016 13:25   Dg Abd 1 View  04/24/2016  CLINICAL DATA:  Orogastric tube placement EXAM: ABDOMEN - 1 VIEW COMPARISON:  None. FINDINGS: Orogastric tube with the tip projecting over the stomach. There is no bowel dilatation to suggest obstruction. There is no evidence of pneumoperitoneum, portal venous gas or pneumatosis. There are no pathologic calcifications along the expected course of the ureters. The osseous structures are unremarkable. IMPRESSION: Orogastric tube with the tip projecting over the stomach. Electronically Signed    By: Kathreen Devoid   On: 04/24/2016 21:10   Ct Head Wo Contrast  04/24/2016  CLINICAL DATA:  Altered mental status. EXAM: CT HEAD WITHOUT CONTRAST TECHNIQUE: Contiguous axial images were obtained from the base of the skull through the vertex without intravenous contrast. COMPARISON:  None. FINDINGS: Bony calvarium appears intact. Mild chronic ischemic white matter disease is noted. No mass effect or midline shift is noted. Ventricular size is within normal limits. There is no evidence of mass lesion, hemorrhage or acute infarction. IMPRESSION: Mild chronic ischemic white matter disease. No acute intracranial abnormality seen. Electronically Signed   By: Marijo Conception, M.D.   On: 04/24/2016 08:52   Mr Jeri Cos QJ Contrast  04/25/2016  ADDENDUM REPORT: 04/25/2016 15:19 ADDENDUM: These results were called by telephone at the time of interpretation on 04/25/2016 at 3:19 pm to Dr. Benjie Karvonen , who verbally acknowledged these results. Electronically Signed   By: Franchot Gallo M.D.   On: 04/25/2016 15:19  04/25/2016  CLINICAL DATA:  Seizure.  History of fever in the emergency room EXAM: MRI HEAD WITHOUT AND WITH CONTRAST TECHNIQUE: Multiplanar, multiecho pulse sequences of the brain and surrounding structures were obtained without and with intravenous contrast. CONTRAST:  64m MULTIHANCE GADOBENATE DIMEGLUMINE 529 MG/ML IV SOLN COMPARISON:  CT head 04/24/2016 FINDINGS: Abnormal T2 signal in the left hippocampus and medial temporal lobe extending into the insular cortex. There is also mild hyperintensity in the left cingulate gyrus. These areas show hyperintense signal on diffusion-weighted imaging without definite restricted diffusion. No associated hemorrhage. Mild enhancement in the left insula and left hippocampus postcontrast administration. No other areas of abnormal signal.  Ventricle size normal. Negative for hemorrhage or mass lesion. Pituitary normal in size.  Normal orbit. Mucous retention cysts in the  maxillary sinus bilaterally. Mucosal edema in the paranasal sinuses. IMPRESSION: Diffuse signal abnormality in the left limbic system including the hippocampus and insula and left cingulate gyrus. There is mild enhancement postcontrast. Differential includes herpes encephalitis, especially given fever. 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 Electronically Signed: By: CFranchot GalloM.D. On: 04/25/2016 15:04   Dg Chest Port 1 View  04/25/2016  CLINICAL DATA:  70year old female ICU patient on vent. EXAM: PORTABLE CHEST 1 VIEW COMPARISON:  Chest radiograph dated 04/24/2016 FINDINGS: Endotracheal tube above the carina. An enteric tube courses towards the upper abdomen, and left IJ central line with tip over central SVC in stable position. There is no focal consolidation, pleural effusion, or pneumothorax. The cardiac silhouette is within normal limits. No acute osseous pathology. IMPRESSION: No acute cardiopulmonary process.  No significant interval change. Electronically Signed   By: AAnner CreteM.D.   On: 04/25/2016 06:12   Dg Chest Port 1 View  04/24/2016  CLINICAL DATA:  Central line placement. EXAM: PORTABLE CHEST 1 VIEW COMPARISON:  04/24/2016 at 13:00 p.m. FINDINGS: Patient slightly rotated to the left. Endotracheal tube has tip 2.6 cm above the carina. Nasogastric tube courses into the region of the stomach and off the inferior portion of the film. There is been interval placement of a left IJ central venous catheter which has tip obliquely oriented over the SVC just below the level of the carina. Lungs are adequately inflated with mild prominence of the perihilar markings suggesting mild vascular congestion. No definite effusion or pneumothorax. Cardiomediastinal silhouette and remainder of the exam is unchanged. IMPRESSION: Findings suggesting mild vascular congestion. Tubes and lines as described.  No pneumothorax. Electronically Signed   By: DMarin OlpM.D.   On:  04/24/2016 21:27    Assessment:   AMIKAILI FLIPPINis a 70y.o. female with 3-4 days of AMS followed by fevers, seizures, now intubated. CSF shows bloody tap but tube 4 still with 216 RBC, 51 WBC 51 % lymphs, only 16% PMNs, Protein 73, glucose 59. MRI shows L limbic process, EEG pending BCX, CSF Cx pending. 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  Doubt bacterial but would continue vanco ctx for 24-48 hours pending cultures  Recommendations Continue acyclovir  Cont vanco and ceftriaxone for now but if cx neg tomorrow can dc Send arboviral panel  to state lab (ordered) Discussed with family that there may be long term neurological morbidity with this if it is HSV1 but can hope for a meaningful recovery.  Thank you very much for allowing me to participate in the care of this patient. Please call with questions.   Cheral Marker. Ola Spurr, MD

## 2016-04-25 NOTE — Progress Notes (Addendum)
Subjective: Patient intubated and sedated.  No further seizure activity noted.    Objective: Current vital signs: BP 103/72 mmHg  Pulse 123  Temp(Src) 102 F (38.9 C) (Other (Comment))  Resp 14  Ht 5' 3"  (1.6 m)  Wt 82.5 kg (181 lb 14.1 oz)  BMI 32.23 kg/m2  SpO2 100% Vital signs in last 24 hours: Temp:  [100.8 F (38.2 C)-102 F (38.9 C)] 102 F (38.9 C) (06/23 0800) Pulse Rate:  [69-123] 123 (06/23 0800) Resp:  [7-20] 14 (06/23 0800) BP: (71-134)/(44-83) 103/72 mmHg (06/23 0800) SpO2:  [99 %-100 %] 100 % (06/23 1527) FiO2 (%):  [40 %] 40 % (06/23 1527) Weight:  [82.5 kg (181 lb 14.1 oz)] 82.5 kg (181 lb 14.1 oz) (06/23 0500)  Intake/Output from previous day: 06/22 0701 - 06/23 0700 In: 2972.7 [I.V.:1391.7; IV Piggyback:1581] Out: 9476 [Urine:3750] Intake/Output this shift: Total I/O In: 103 [I.V.:3; NG/GT:100] Out: -  Nutritional status: Diet NPO time specified  Neurologic Exam: Mental Status: Eyes open with light sternal rub.  Does not follow commands. No speech.  Cranial Nerves: II: patient does not respond confrontation bilaterally, pupils right 3 mm, left 3 mm,and reactive bilaterally III,IV,VI: Oculocephalic maneuvers intact bilaterally  V,VII: corneal reflex present bilaterally  VIII: grossly intact IX,X: gag reflex reduced, XI: trapezius strength unable to test bilaterally XII: unable to test Motor: Moves all extremities with no focal weakness noted.  Sensory: Responds to noxious stimuli in all extremities. Deep Tendon Reflexes:  1+ in the upper extremities and absent in the lower extremities Plantars: Upgoing on the right Cerebellar: Unable to perform  Lab Results: Basic Metabolic Panel:  Recent Labs Lab 04/24/16 0750 04/24/16 1138 04/25/16 0410 04/25/16 1415  NA 126* 130* 132*  --   K 3.8 3.2* 3.5  --   CL 92* 91* 104  --   CO2 23 18* 23  --   GLUCOSE 149* 141* 131*  --   BUN 17 15 10   --   CREATININE 0.94 1.24* 0.80  --    CALCIUM 8.6* 8.4* 6.7*  --   MG  --  1.8 2.2 2.1  PHOS  --  1.7* 2.5 2.2*    Liver Function Tests:  Recent Labs Lab 04/24/16 0750 04/24/16 1138  AST 29 39  ALT 19 20  ALKPHOS 47 49  BILITOT 0.7 0.5  PROT 7.2 7.5  ALBUMIN 4.2 4.3   No results for input(s): LIPASE, AMYLASE in the last 168 hours. No results for input(s): AMMONIA in the last 168 hours.  CBC:  Recent Labs Lab 04/24/16 0750 04/24/16 1138 04/25/16 0410  WBC 11.6* 20.2* 11.0  NEUTROABS 8.4* 11.7*  --   HGB 13.5 13.3 10.8*  HCT 38.7 38.6 31.7*  MCV 82.1 85.3 82.8  PLT 226 269 203    Cardiac Enzymes:  Recent Labs Lab 04/24/16 0750  TROPONINI <0.03    Lipid Panel: No results for input(s): CHOL, TRIG, HDL, CHOLHDL, VLDL, LDLCALC in the last 168 hours.  CBG:  Recent Labs Lab 04/24/16 1750 04/24/16 2035 04/25/16 0534 04/25/16 0811  GLUCAP 165* 158* 137* 127*    Microbiology: Results for orders placed or performed during the hospital encounter of 04/24/16  Blood culture (routine x 2)     Status: None (Preliminary result)   Collection Time: 04/24/16 11:38 AM  Result Value Ref Range Status   Specimen Description BLOOD RIGHT ASSIST CONTROL  Final   Special Requests   Final    BOTTLES DRAWN AEROBIC AND  ANAEROBIC  AERO 20CC ANA [place]   Culture NO GROWTH 1 DAY  Final   Report Status PENDING  Incomplete  CSF culture     Status: None (Preliminary result)   Collection Time: 04/24/16 11:38 AM  Result Value Ref Range Status   Specimen Description CSF  Final   Special Requests Normal  Final   Gram Stain RARE WBC SEEN NO ORGANISMS SEEN   Final   Culture   Final    NO GROWTH < 24 HOURS Performed at [facility]    Report Status PENDING  Incomplete  Culture, blood (routine x 2)     Status: None (Preliminary result)   Collection Time: 04/24/16 11:38 AM  Result Value Ref Range Status   Specimen Description BLOOD RIGHT ASSIST CONTROL  Final   Special Requests   Final    BOTTLES DRAWN  AEROBIC AND ANAEROBIC  AERO 10CC ANA Benjamin Perez   Culture NO GROWTH < 24 HOURS  Final   Report Status PENDING  Incomplete  MRSA PCR Screening     Status: None   Collection Time: 04/24/16  3:20 PM  Result Value Ref Range Status   MRSA by PCR NEGATIVE NEGATIVE Final    Comment:        The GeneXpert MRSA Assay (FDA approved for NASAL specimens only), is one component of a comprehensive MRSA colonization surveillance program. It is not intended to diagnose MRSA infection nor to guide or monitor treatment for MRSA infections.   Urine culture     Status: None   Collection Time: 04/24/16  4:31 PM  Result Value Ref Range Status   Specimen Description URINE, CLEAN CATCH  Final   Special Requests Normal  Final   Culture NO GROWTH Performed at [facility]   Final   Report Status 04/25/2016 FINAL  Final  Culture, respiratory (NON-Expectorated)     Status: None (Preliminary result)   Collection Time: 04/24/16  4:31 PM  Result Value Ref Range Status   Specimen Description TRACHEAL ASPIRATE  Final   Special Requests NONE  Final   Gram Stain   Final    ABUNDANT WBC PRESENT,BOTH PMN AND MONONUCLEAR FEW GRAM POSITIVE COCCI IN PAIRS RARE GRAM VARIABLE ROD    Culture   Final    NO GROWTH < 24 HOURS Performed at [facility]    Report Status PENDING  Incomplete    Coagulation Studies: No results for input(s): LABPROT, INR in the last 72 hours.  Imaging: Dg Chest 1 View  04/24/2016  CLINICAL DATA:  Status post intubation. EXAM: CHEST 1 VIEW COMPARISON:  None FINDINGS: The ET tube tip is in the right mainstem bronchus. This should be withdrawn by approximately 1-2 cm. The heart size is enlarged and there is diffuse pulmonary vascular congestion. No focal airspace consolidation noted. IMPRESSION: 1. Right mainstem bronchus intubation. These results will be called to the ordering clinician or representative by the Radiologist Assistant, and communication documented in the PACS or  zVision Dashboard. Electronically Signed   By: Kerby Moors M.D.   On: 04/24/2016 13:25   Dg Abd 1 View  04/24/2016  CLINICAL DATA:  Orogastric tube placement EXAM: ABDOMEN - 1 VIEW COMPARISON:  None. FINDINGS: Orogastric tube with the tip projecting over the stomach. There is no bowel dilatation to suggest obstruction. There is no evidence of pneumoperitoneum, portal venous gas or pneumatosis. There are no pathologic calcifications along the expected course of the ureters. The osseous structures are unremarkable. IMPRESSION:  Orogastric tube with the tip projecting over the stomach. Electronically Signed   By: Kathreen Devoid   On: 04/24/2016 21:10   Ct Head Wo Contrast  04/24/2016  CLINICAL DATA:  Altered mental status. EXAM: CT HEAD WITHOUT CONTRAST TECHNIQUE: Contiguous axial images were obtained from the base of the skull through the vertex without intravenous contrast. COMPARISON:  None. FINDINGS: Bony calvarium appears intact. Mild chronic ischemic white matter disease is noted. No mass effect or midline shift is noted. Ventricular size is within normal limits. There is no evidence of mass lesion, hemorrhage or acute infarction. IMPRESSION: Mild chronic ischemic white matter disease. No acute intracranial abnormality seen. Electronically Signed   By: Marijo Conception, M.D.   On: 04/24/2016 08:52   Mr Jeri Cos HW Contrast  04/25/2016  ADDENDUM REPORT: 04/25/2016 15:19 ADDENDUM: These results were called by telephone at the time of interpretation on 04/25/2016 at 3:19 pm to Dr. Benjie Karvonen , who verbally acknowledged these results. Electronically Signed   By: Franchot Gallo M.D.   On: 04/25/2016 15:19  04/25/2016  CLINICAL DATA:  Seizure.  History of fever in the emergency room EXAM: MRI HEAD WITHOUT AND WITH CONTRAST TECHNIQUE: Multiplanar, multiecho pulse sequences of the brain and surrounding structures were obtained without and with intravenous contrast. CONTRAST:  2m MULTIHANCE GADOBENATE DIMEGLUMINE 529  MG/ML IV SOLN COMPARISON:  CT head 04/24/2016 FINDINGS: Abnormal T2 signal in the left hippocampus and medial temporal lobe extending into the insular cortex. There is also mild hyperintensity in the left cingulate gyrus. These areas show hyperintense signal on diffusion-weighted imaging without definite restricted diffusion. No associated hemorrhage. Mild enhancement in the left insula and left hippocampus postcontrast administration. No other areas of abnormal signal.  Ventricle size normal. Negative for hemorrhage or mass lesion. Pituitary normal in size.  Normal orbit. Mucous retention cysts in the maxillary sinus bilaterally. Mucosal edema in the paranasal sinuses. IMPRESSION: Diffuse signal abnormality in the left limbic system including the hippocampus and insula and left cingulate gyrus. There is mild enhancement postcontrast. Differential includes herpes encephalitis, especially given fever. 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 Electronically Signed: By: CFranchot GalloM.D. On: 04/25/2016 15:04   Dg Chest Port 1 View  04/25/2016  CLINICAL DATA:  70year old female ICU patient on vent. EXAM: PORTABLE CHEST 1 VIEW COMPARISON:  Chest radiograph dated 04/24/2016 FINDINGS: Endotracheal tube above the carina. An enteric tube courses towards the upper abdomen, and left IJ central line with tip over central SVC in stable position. There is no focal consolidation, pleural effusion, or pneumothorax. The cardiac silhouette is within normal limits. No acute osseous pathology. IMPRESSION: No acute cardiopulmonary process.  No significant interval change. Electronically Signed   By: AAnner CreteM.D.   On: 04/25/2016 06:12   Dg Chest Port 1 View  04/24/2016  CLINICAL DATA:  Central line placement. EXAM: PORTABLE CHEST 1 VIEW COMPARISON:  04/24/2016 at 13:00 p.m. FINDINGS: Patient slightly rotated to the left. Endotracheal tube has tip 2.6 cm above the carina. Nasogastric tube  courses into the region of the stomach and off the inferior portion of the film. There is been interval placement of a left IJ central venous catheter which has tip obliquely oriented over the SVC just below the level of the carina. Lungs are adequately inflated with mild prominence of the perihilar markings suggesting mild vascular congestion. No definite effusion or pneumothorax. Cardiomediastinal silhouette and remainder of the exam is unchanged. IMPRESSION: Findings suggesting mild vascular congestion.  Tubes and lines as described.  No pneumothorax. Electronically Signed   By: Marin Olp M.D.   On: 04/24/2016 21:27    Medications:  I have reviewed the patient's current medications. Scheduled: . acyclovir  800 mg Intravenous Q8H  . antiseptic oral rinse  7 mL Mouth Rinse 10 times per day  . cefTRIAXone (ROCEPHIN)  IV  2 g Intravenous Q12H  . chlorhexidine gluconate (SAGE KIT)  15 mL Mouth Rinse BID  . enoxaparin (LOVENOX) injection  40 mg Subcutaneous Q24H  . famotidine  20 mg Oral BID  . feeding supplement (PRO-STAT SUGAR FREE 64)  30 mL Per Tube Daily  . feeding supplement (VITAL HIGH PROTEIN)  1,000 mL Per Tube Q24H  . fentaNYL (SUBLIMAZE) injection  50 mcg Intravenous Once  . free water  100 mL Per Tube Q8H  . insulin aspart  2-6 Units Subcutaneous Q4H  . levETIRAcetam  500 mg Intravenous Q12H  . levothyroxine  100 mcg Oral QAC breakfast  . sodium chloride flush  3 mL Intravenous Q12H  . vancomycin  750 mg Intravenous Q12H    Assessment/Plan: No further seizures noted.  Patient on Keppra.  MRI of the brain personally reviewed and shows signal abnormality in the left limbic system.  EEG shows left frontal temporal sharp transients.  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  Patient on Acyclovir.  Titer pending.  ID involved.   Condition, prognosis and treatment discussed with husband and son.  All questions addressed.     Recommendations: 1.  Agree with current management 2.  Continue Keppra at current dose   LOS: 1 day   Alexis Goodell, MD Neurology [phone] 04/25/2016  4:50 PM

## 2016-04-25 NOTE — Progress Notes (Signed)
[facility] ADULT ICU REPLACEMENT PROTOCOL FOR AM LAB REPLACEMENT ONLY  The patient does apply for the [facility] Adult ICU Electrolyte Replacment Protocol based on the criteria listed below:   1. Is GFR >/= 40 ml/min? Yes.    Patient's GFR today is >60 2. Is urine output >/= 0.5 ml/kg/hr for the last 6 hours? Yes.   Patient's UOP is 1.8 ml/kg/hr 3. Is BUN < 60 mg/dL? Yes.    Patient's BUN today is 10 4. Abnormal electrolyte(s): K3.5 5. Ordered repletion with: per protocol 6. If a panic level lab has been reported, has the CCM MD in charge been notified? Yes.  .   Physician:  Ashok Cordia, MD  [facility] 04/25/2016 6:36 AM

## 2016-04-25 NOTE — Progress Notes (Signed)
CONCERNING: IV to Oral Route Change Policy  RECOMMENDATION: This patient is receiving famotidine by the intravenous route.  Based on criteria approved by the Pharmacy and Therapeutics Committee, the intravenous medication(s) is/are being converted to the equivalent oral dose form(s).   DESCRIPTION: These criteria include:  The patient is eating (either orally or via tube) and/or has been taking other orally administered medications for a least 24 hours  The patient has no evidence of active gastrointestinal bleeding or impaired GI absorption (gastrectomy, short bowel, patient on TNA or NPO).  If you have questions about this conversion, please contact the Pharmacy Department  []   [phone] )  Cathy Harrington [x]   [phone] )  [facility] []   [phone] )  [facility] []   [phone] )  [facility] []   [phone] )  [place], [facility] 04/25/2016 3:57 PM

## 2016-04-25 NOTE — Progress Notes (Signed)
[name] for electrolyte management   No Known Allergies  Patient Measurements: Height: 5\' 3"  (160 cm) Weight: 181 lb 14.1 oz (82.5 kg) IBW/kg (Calculated) : 52.4   Vital Signs: Temp: 102 F (38.9 C) (06/23 0800) Temp Source: Other (Comment) (06/23 0800) BP: 103/72 mmHg (06/23 0800) Pulse Rate: 123 (06/23 0800) Intake/Output from previous day: 06/22 0701 - 06/23 0700 In: 2972.7 [I.V.:1391.7; IV Piggyback:1581] Out: [id number] [Urine:3750] Intake/Output from this shift: Total I/O In: 100 [NG/GT:100] Out: -   Labs:  Recent Labs  04/24/16 0750 04/24/16 1138 04/25/16 0410 04/25/16 1415  WBC 11.6* 20.2* 11.0  --   HGB 13.5 13.3 10.8*  --   HCT 38.7 38.6 31.7*  --   PLT 226 269 203  --   CREATININE 0.94 1.24* 0.80  --   MG  --  1.8 2.2 2.1  PHOS  --  1.7* 2.5 2.2*  ALBUMIN 4.2 4.3  --   --   PROT 7.2 7.5  --   --   AST 29 39  --   --   ALT 19 20  --   --   ALKPHOS 47 49  --   --   BILITOT 0.7 0.5  --   --    Estimated Creatinine Clearance: 67.5 mL/min (by C-G formula based on Cr of 0.8).   Assessment: Pharmacy consulted for electrolyte management for 70 yo female admitted with possible meningitis and requiring mechanical ventilation. Patient received potassium chloride 36mEq IV Q1 hr x 2 doses.    Plan:  Will order calcium gluconate 2g IV x 1.   Will recheck electrolytes with am labs.    Pharmacy will continue to monitor and adjust per consult.    Cathy Harrington L 04/25/2016,3:53 PM

## 2016-04-25 NOTE — Progress Notes (Signed)
Initial Nutrition Assessment  DOCUMENTATION CODES:   Obesity unspecified  INTERVENTION:  -Discussed nutritional poc with MD Kasa during ICU rounds and received verbal order to start TF via Adult Tube Feeding Protocol, PEPuP initiated. Recommend Vital High Protein at goal rate of 45 ml/hr with Prostat daily providing 1180 kcals, 110 g of protein and 907 mL of free water   NUTRITION DIAGNOSIS:   Inadequate oral intake related to acute illness as evidenced by NPO status.  GOAL:   Provide needs based on ASPEN/SCCM guidelines  MONITOR:   TF tolerance, Vent status, Labs, Weight trends  REASON FOR ASSESSMENT:   Ventilator, Consult Enteral/tube feeding initiation and management  ASSESSMENT:   70 yo female admitted with AMS, seizures requiring intubation on 04/24/16   Pt remains sedated on vent, LP results with possible meningitis/encephalitis, EEG and MRI pending for today.   Past Medical History  Diagnosis Date  . Thyroid disease   . Diabetes mellitus without complication ([place])     Diet Order:  Diet NPO time specified  Skin:  Reviewed, no issues  Last BM:  no documented BM   Labs: reviewed   Meds: reviewed  Height:   Ht Readings from Last 1 Encounters:  04/24/16 5\' 3"  (1.6 m)    Weight:   Wt Readings from Last 1 Encounters:  04/25/16 181 lb 14.1 oz (82.5 kg)   Filed Weights   04/24/16 0709 04/24/16 1408 04/25/16 0500  Weight: 180 lb (81.647 kg) 183 lb 10.3 oz (83.3 kg) 181 lb 14.1 oz (82.5 kg)    BMI:  Body mass index is 32.23 kg/(m^2).  Estimated Nutritional Needs:   Kcal:  [phone] kcals  Protein:  >/= 104 g  Fluid:  >/= 1.5 L  EDUCATION NEEDS:   No education needs identified at this time  [place], [place], [place] [phone] Pager  [phone] Weekend/On-Call Pager

## 2016-04-25 NOTE — Progress Notes (Signed)
Pharmacy Antibiotic Note  Cathy Harrington is a 70 y.o. female admitted on 04/24/2016 with meningitis.  Pharmacy has been consulted for acyclovir dosing for possible viral meningitis.  Plan: Acyclovir 800mg  IV Q8hr.   Height: 5\' 3"  (160 cm) Weight: 181 lb 14.1 oz (82.5 kg) IBW/kg (Calculated) : 52.4  Temp (24hrs), Avg:101.3 F (38.5 C), Min:100.8 F (38.2 C), Max:102 F (38.9 C)   Recent Labs Lab 04/24/16 0750 04/24/16 1138 04/24/16 1405 04/25/16 0410 04/25/16 1100  WBC 11.6* 20.2*  --  11.0  --   CREATININE 0.94 1.24*  --  0.80  --   LATICACIDVEN  --  1.4 1.4  --  0.9    Estimated Creatinine Clearance: 67.5 mL/min (by C-G formula based on Cr of 0.8).    No Known Allergies  Antimicrobials this admission: Acyclovir 6/22 >>  Ampicillin 6/22 >> 6/23 Ceftriaxone 6/22 >> 6/23 Vancomycin 6/22 >> 6/23  Dose adjustments this admission: 6/23 Acyclovir transitioned to Q8hr dosing.   Microbiology results: 6/22 BCx: no growth x 1 day  6/22 UCx: no growth  6/22 Sputum: no growth < 24 hours 6/22 MRSA PCR: negative   6/22 CSF: no growth < 24 hours  Pharmacy will continue to monitor and adjust per consult.    [name] 04/25/2016 3:53 PM

## 2016-04-26 ENCOUNTER — Inpatient Hospital Stay: Payer: Medicare Other

## 2016-04-26 DIAGNOSIS — G0490 Encephalitis and encephalomyelitis, unspecified: Secondary | ICD-10-CM | POA: Insufficient documentation

## 2016-04-26 DIAGNOSIS — R569 Unspecified convulsions: Secondary | ICD-10-CM | POA: Insufficient documentation

## 2016-04-26 DIAGNOSIS — R4182 Altered mental status, unspecified: Secondary | ICD-10-CM | POA: Insufficient documentation

## 2016-04-26 DIAGNOSIS — J9601 Acute respiratory failure with hypoxia: Secondary | ICD-10-CM

## 2016-04-26 DIAGNOSIS — R509 Fever, unspecified: Secondary | ICD-10-CM | POA: Insufficient documentation

## 2016-04-26 LAB — MAGNESIUM
Magnesium: 1.9 mg/dL (ref 1.7–2.4)
Magnesium: 1.7 mg/dL (ref 1.7–2.4)

## 2016-04-26 LAB — GLUCOSE, CAPILLARY
GLUCOSE-CAPILLARY: 106 mg/dL — AB (ref 65–99)
Glucose-Capillary: 115 mg/dL — ABNORMAL HIGH (ref 65–99)
Glucose-Capillary: 146 mg/dL — ABNORMAL HIGH (ref 65–99)
Glucose-Capillary: 150 mg/dL — ABNORMAL HIGH (ref 65–99)
Glucose-Capillary: 159 mg/dL — ABNORMAL HIGH (ref 65–99)
Glucose-Capillary: 92 mg/dL (ref 65–99)

## 2016-04-26 LAB — BASIC METABOLIC PANEL WITH GFR
Anion gap: 6 (ref 5–15)
BUN: 10 mg/dL (ref 6–20)
CO2: 25 mmol/L (ref 22–32)
Calcium: 7.4 mg/dL — ABNORMAL LOW (ref 8.9–10.3)
Chloride: 104 mmol/L (ref 101–111)
Creatinine, Ser: 0.62 mg/dL (ref 0.44–1.00)
GFR calc Af Amer: >60 mL/min
GFR calc non Af Amer: >60 mL/min
Glucose, Bld: 173 mg/dL — ABNORMAL HIGH (ref 65–99)
Potassium: 3.2 mmol/L — ABNORMAL LOW (ref 3.5–5.1)
Sodium: 135 mmol/L (ref 135–145)

## 2016-04-26 LAB — PHOSPHORUS
Phosphorus: 2.3 mg/dL — ABNORMAL LOW (ref 2.5–4.6)
Phosphorus: 2.4 mg/dL — ABNORMAL LOW (ref 2.5–4.6)

## 2016-04-26 MED ORDER — POTASSIUM CHLORIDE 20 MEQ/15ML (10%) PO SOLN: 40.0000 meq | Freq: Once | ORAL | Status: DC

## 2016-04-26 MED ORDER — CHLORHEXIDINE GLUCONATE 0.12 % MT SOLN
15.0000 mL | Freq: Two times a day (BID) | OROMUCOSAL | Status: DC
  Administered 2016-04-26 – 2016-04-28 (×5): 15 mL via OROMUCOSAL
  Filled 2016-04-26: qty 15

## 2016-04-26 MED ORDER — CETYLPYRIDINIUM CHLORIDE 0.05 % MT LIQD
7.0000 mL | Freq: Two times a day (BID) | OROMUCOSAL | Status: DC
  Administered 2016-04-26 – 2016-04-28 (×5): 7 mL via OROMUCOSAL

## 2016-04-26 MED ORDER — CETYLPYRIDINIUM CHLORIDE 0.05 % MT LIQD
7.0000 mL | Freq: Two times a day (BID) | OROMUCOSAL | Status: DC
  Administered 2016-04-26: 7 mL via OROMUCOSAL

## 2016-04-26 MED ORDER — POTASSIUM CHLORIDE 10 MEQ/50ML IV SOLN
10.0000 meq | INTRAVENOUS | Status: AC
  Administered 2016-04-26 (×4): 10 meq via INTRAVENOUS
  Filled 2016-04-26 (×4): qty 50

## 2016-04-26 NOTE — Progress Notes (Signed)
Subjective: Patient intubated and sedated.    Objective: Current vital signs: BP 106/55 mmHg  Pulse 74  Temp(Src) 98.5 F (36.9 C) (Oral)  Resp 16  Ht _0  (1.6 m)  Wt 82.5 kg (181 lb 14.1 oz)  BMI 32.23 kg/m2  SpO2 100% Vital signs in last 24 hours: Temp:  [98.5 F (36.9 C)-102.6 F (39.2 C)] 98.5 F (36.9 C) (06/24 0500) Pulse Rate:  [62-83] 74 (06/24 0600) Resp:  [15-16] 16 (06/24 0600) BP: (99-133)/(35-83) 106/55 mmHg (06/24 0600) SpO2:  [97 %-100 %] 100 % (06/24 0802) FiO2 (%):  [30 %-40 %] 30 % (06/24 0802) Weight:  [82.5 kg (181 lb 14.1 oz)] 82.5 kg (181 lb 14.1 oz) (06/24 0500)  Intake/Output from previous day: 06/23 0701 - 06/24 0700 In: 3904 [I.V.:1997.2; NG/GT:1006.8; IV Piggyback:900] Out: 2750 [Urine:2750] Intake/Output this shift:   Nutritional status: Diet NPO time specified  Neurologic Exam: Mental Status: Eyes open with light sternal rub. Does not follow commands. No speech.  Cranial Nerves: II: patient does not respond confrontation bilaterally, pupils right 2 mm, left 2 mm,and sluggishly reactive bilaterally III,IV,VI: Oculocephalic maneuvers intact bilaterally  V,VII: corneal reflex present bilaterally  VIII: grossly intact IX,X: gag reflex reduced, XI: trapezius strength unable to test bilaterally XII: unable to test Motor: Moves all extremities with no focal weakness noted.  Sensory: Responds to noxious stimuli in all extremities. Deep Tendon Reflexes:  1+ in the upper extremities and absent in the lower extremities   Lab Results: Basic Metabolic Panel:  Recent Labs Lab 04/24/16 0750 04/24/16 1138 04/25/16 0410 04/25/16 1415 04/26/16 0500  NA 126* 130* 132*  --  135  K 3.8 3.2* 3.5  --  3.2*  CL 92* 91* 104  --  104  CO2 23 18* 23  --  25  GLUCOSE 149* 141* 131*  --  173*  BUN _1 --  10  CREATININE 0.94 1.24* 0.80  --  0.62  CALCIUM 8.6* 8.4* 6.7*  --  7.4*  MG  --  1.8 2.2 2.1 1.9  PHOS  --  1.7* 2.5 2.2*  2.3*    Liver Function Tests:  Recent Labs Lab 04/24/16 0750 04/24/16 1138  AST 29 39  ALT 19 20  ALKPHOS 47 49  BILITOT 0.7 0.5  PROT 7.2 7.5  ALBUMIN 4.2 4.3   No results for input(s): LIPASE, AMYLASE in the last 168 hours. No results for input(s): AMMONIA in the last 168 hours.  CBC:  Recent Labs Lab 04/24/16 0750 04/24/16 1138 04/25/16 0410  WBC 11.6* 20.2* 11.0  NEUTROABS 8.4* 11.7*  --   HGB 13.5 13.3 10.8*  HCT 38.7 38.6 31.7*  MCV 82.1 85.3 82.8  PLT 226 269 203    Cardiac Enzymes:  Recent Labs Lab 04/24/16 0750  TROPONINI <0.03    Lipid Panel: No results for input(s): CHOL, TRIG, HDL, CHOLHDL, VLDL, LDLCALC in the last 168 hours.  CBG:  Recent Labs Lab 04/25/16 1609 04/25/16 1931 04/25/16 2332 04/26/16 0350 04/26/16 0758  GLUCAP 124* 162* 161* 150* 159*    Microbiology: Results for orders placed or performed during the hospital encounter of 04/24/16  Blood culture (routine x 2)     Status: None (Preliminary result)   Collection Time: 04/24/16 11:38 AM  Result Value Ref Range Status   Specimen Description BLOOD RIGHT ASSIST CONTROL  Final   Special Requests   Final    BOTTLES DRAWN AEROBIC AND ANAEROBIC  AERO 20CC ANA  [place]   Culture NO GROWTH 1 DAY  Final   Report Status PENDING  Incomplete  CSF culture     Status: None (Preliminary result)   Collection Time: 04/24/16 11:38 AM  Result Value Ref Range Status   Specimen Description CSF  Final   Special Requests Normal  Final   Gram Stain RARE WBC SEEN NO ORGANISMS SEEN   Final   Culture   Final    NO GROWTH 2 DAYS Performed at [facility]    Report Status PENDING  Incomplete  Culture, blood (routine x 2)     Status: None (Preliminary result)   Collection Time: 04/24/16 11:38 AM  Result Value Ref Range Status   Specimen Description BLOOD RIGHT ASSIST CONTROL  Final   Special Requests   Final    BOTTLES DRAWN AEROBIC AND ANAEROBIC  AERO 10CC ANA [place]   Culture NO GROWTH  < 24 HOURS  Final   Report Status PENDING  Incomplete  MRSA PCR Screening     Status: None   Collection Time: 04/24/16  3:20 PM  Result Value Ref Range Status   MRSA by PCR NEGATIVE NEGATIVE Final    Comment:        The GeneXpert MRSA Assay (FDA approved for NASAL specimens only), is one component of a comprehensive MRSA colonization surveillance program. It is not intended to diagnose MRSA infection nor to guide or monitor treatment for MRSA infections.   Urine culture     Status: None   Collection Time: 04/24/16  4:31 PM  Result Value Ref Range Status   Specimen Description URINE, CLEAN CATCH  Final   Special Requests Normal  Final   Culture NO GROWTH Performed at [facility]   Final   Report Status 04/25/2016 FINAL  Final  Culture, respiratory (NON-Expectorated)     Status: None (Preliminary result)   Collection Time: 04/24/16  4:31 PM  Result Value Ref Range Status   Specimen Description TRACHEAL ASPIRATE  Final   Special Requests NONE  Final   Gram Stain   Final    ABUNDANT WBC PRESENT,BOTH PMN AND MONONUCLEAR FEW GRAM POSITIVE COCCI IN PAIRS RARE GRAM VARIABLE ROD    Culture   Final    NO GROWTH < 24 HOURS Performed at [facility]    Report Status PENDING  Incomplete    Coagulation Studies: No results for input(s): LABPROT, INR in the last 72 hours.  Imaging: Dg Chest 1 View  04/24/2016  CLINICAL DATA:  Status post intubation. EXAM: CHEST 1 VIEW COMPARISON:  None FINDINGS: The ET tube tip is in the right mainstem bronchus. This should be withdrawn by approximately 1-2 cm. The heart size is enlarged and there is diffuse pulmonary vascular congestion. No focal airspace consolidation noted. IMPRESSION: 1. Right mainstem bronchus intubation. These results will be called to the ordering clinician or representative by the Radiologist Assistant, and communication documented in the PACS or zVision Dashboard. Electronically Signed   By: Kerby Moors M.D.    On: 04/24/2016 13:25   Dg Abd 1 View  04/24/2016  CLINICAL DATA:  Orogastric tube placement EXAM: ABDOMEN - 1 VIEW COMPARISON:  None. FINDINGS: Orogastric tube with the tip projecting over the stomach. There is no bowel dilatation to suggest obstruction. There is no evidence of pneumoperitoneum, portal venous gas or pneumatosis. There are no pathologic calcifications along the expected course of the ureters. The osseous structures are unremarkable. IMPRESSION: Orogastric tube with the tip projecting  over the stomach. Electronically Signed   By: Kathreen Devoid   On: 04/24/2016 21:10   Ct Head Wo Contrast  04/24/2016  CLINICAL DATA:  Altered mental status. EXAM: CT HEAD WITHOUT CONTRAST TECHNIQUE: Contiguous axial images were obtained from the base of the skull through the vertex without intravenous contrast. COMPARISON:  None. FINDINGS: Bony calvarium appears intact. Mild chronic ischemic white matter disease is noted. No mass effect or midline shift is noted. Ventricular size is within normal limits. There is no evidence of mass lesion, hemorrhage or acute infarction. IMPRESSION: Mild chronic ischemic white matter disease. No acute intracranial abnormality seen. Electronically Signed   By: Marijo Conception, M.D.   On: 04/24/2016 08:52   Mr Jeri Cos OM Contrast  04/25/2016  ADDENDUM REPORT: 04/25/2016 15:19 ADDENDUM: These results were called by telephone at the time of interpretation on 04/25/2016 at 3:19 pm to Dr. Benjie Karvonen , who verbally acknowledged these results. Electronically Signed   By: Franchot Gallo M.D.   On: 04/25/2016 15:19  04/25/2016  CLINICAL DATA:  Seizure.  History of fever in the emergency room EXAM: MRI HEAD WITHOUT AND WITH CONTRAST TECHNIQUE: Multiplanar, multiecho pulse sequences of the brain and surrounding structures were obtained without and with intravenous contrast. CONTRAST:  66m MULTIHANCE GADOBENATE DIMEGLUMINE 529 MG/ML IV SOLN COMPARISON:  CT head 04/24/2016 FINDINGS: Abnormal  T2 signal in the left hippocampus and medial temporal lobe extending into the insular cortex. There is also mild hyperintensity in the left cingulate gyrus. These areas show hyperintense signal on diffusion-weighted imaging without definite restricted diffusion. No associated hemorrhage. Mild enhancement in the left insula and left hippocampus postcontrast administration. No other areas of abnormal signal.  Ventricle size normal. Negative for hemorrhage or mass lesion. Pituitary normal in size.  Normal orbit. Mucous retention cysts in the maxillary sinus bilaterally. Mucosal edema in the paranasal sinuses. IMPRESSION: Diffuse signal abnormality in the left limbic system including the hippocampus and insula and left cingulate gyrus. There is mild enhancement postcontrast. Differential includes herpes encephalitis, especially given fever. 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 Electronically Signed: By: CFranchot GalloM.D. On: 04/25/2016 15:04   Dg Chest Port 1 View  04/25/2016  CLINICAL DATA:  70year old female ICU patient on vent. EXAM: PORTABLE CHEST 1 VIEW COMPARISON:  Chest radiograph dated 04/24/2016 FINDINGS: Endotracheal tube above the carina. An enteric tube courses towards the upper abdomen, and left IJ central line with tip over central SVC in stable position. There is no focal consolidation, pleural effusion, or pneumothorax. The cardiac silhouette is within normal limits. No acute osseous pathology. IMPRESSION: No acute cardiopulmonary process.  No significant interval change. Electronically Signed   By: AAnner CreteM.D.   On: 04/25/2016 06:12   Dg Chest Port 1 View  04/24/2016  CLINICAL DATA:  Central line placement. EXAM: PORTABLE CHEST 1 VIEW COMPARISON:  04/24/2016 at 13:00 p.m. FINDINGS: Patient slightly rotated to the left. Endotracheal tube has tip 2.6 cm above the carina. Nasogastric tube courses into the region of the stomach and off the inferior portion  of the film. There is been interval placement of a left IJ central venous catheter which has tip obliquely oriented over the SVC just below the level of the carina. Lungs are adequately inflated with mild prominence of the perihilar markings suggesting mild vascular congestion. No definite effusion or pneumothorax. Cardiomediastinal silhouette and remainder of the exam is unchanged. IMPRESSION: Findings suggesting mild vascular congestion. Tubes and lines as described.  No pneumothorax. Electronically Signed   By: Marin Olp M.D.   On: 04/24/2016 21:27    Medications:  I have reviewed the patient's current medications. Scheduled: . acyclovir  800 mg Intravenous Q8H  . antiseptic oral rinse  7 mL Mouth Rinse 10 times per day  . cefTRIAXone (ROCEPHIN)  IV  2 g Intravenous Q12H  . chlorhexidine gluconate (SAGE KIT)  15 mL Mouth Rinse BID  . enoxaparin (LOVENOX) injection  40 mg Subcutaneous Q24H  . famotidine  20 mg Oral BID  . feeding supplement (PRO-STAT SUGAR FREE 64)  30 mL Per Tube Daily  . feeding supplement (VITAL HIGH PROTEIN)  1,000 mL Per Tube Q24H  . fentaNYL (SUBLIMAZE) injection  50 mcg Intravenous Once  . free water  100 mL Per Tube Q8H  . insulin aspart  2-6 Units Subcutaneous Q4H  . levETIRAcetam  500 mg Intravenous Q12H  . levothyroxine  100 mcg Oral QAC breakfast  . potassium chloride  40 mEq Per Tube Once  . sodium chloride flush  3 mL Intravenous Q12H  . vancomycin  750 mg Intravenous Q12H    Assessment/Plan: Remains intubated and sedated.  HSV titer remains pending.  Patient on Acyclovir, Rocephin and Vancomycin.  Afebrile since 1930 last evening.  No further seizures noted.  On Keppra.  Recommendations: 1.Agree with current management.  Will continue to follow with you.     LOS: 2 days   Alexis Goodell, MD Neurology [phone] 04/26/2016  8:09 AM

## 2016-04-26 NOTE — Progress Notes (Addendum)
Update: Patient more awake and following commands, tolerated PSV 5/5 for 2 hours, good vt, tco2~30, mildly weak cough.  Patient extubated to [place]. Still with mildly weak cough. High risk for reintubation.  Cont with close monitoring and aspiration precautions.  Left forearm swelling - will check ultrasound  Family at bedside and update on risk of reintubation.   Vilinda Boehringer, MD [facility] Pager [phone] (please enter 7-digits) On Call Pager - [phone] (please enter 7-digits)

## 2016-04-26 NOTE — Progress Notes (Signed)
Pt left arm noted to be swollen. With several blisters. IV's discontinued to left arm. Arm elevated. Dr. Stevenson Clinch aware. Braysen Cloward E 2:33 PM 04/26/2016

## 2016-04-26 NOTE — Progress Notes (Signed)
Pharmacy Antibiotic Note  Cathy Harrington is a 70 y.o. female admitted on 04/24/2016 with meningitis.  Pharmacy consulted for vancomycin and ceftriaxone dosing.  Plan: Vancomycin 750 IV every 12 hours. Trough with the 5th dose. Goal trough 15-20 mcg/mL. Ceftriaxone 2 g iv q 12 hours.  Will f/u culture results. Suspected viral meningitis so can d/c abx if culture negative per ID.   Height: 5\' 3"  (160 cm) Weight: 181 lb 14.1 oz (82.5 kg) IBW/kg (Calculated) : 52.4  Temp (24hrs), Avg:101.2 F (38.4 C), Min:98.5 F (36.9 C), Max:102.6 F (39.2 C)   Recent Labs Lab 04/24/16 0750 04/24/16 1138 04/24/16 1405 04/25/16 0410 04/25/16 1100 04/25/16 1610 04/26/16 0500  WBC 11.6* 20.2*  --  11.0  --   --   --   CREATININE 0.94 1.24*  --  0.80  --   --  0.62  LATICACIDVEN  --  1.4 1.4  --  0.9 0.7  --     Estimated Creatinine Clearance: 67.5 mL/min (by C-G formula based on Cr of 0.62).    No Known Allergies  Antimicrobials this admission: acyclovir 6/22 >>  ampicillin 6/22 >> 6/23 Ceftriaxone 6/22 >> vancomycin 6/22 >>  Dose adjustments this admission:  Microbiology results: 6/22 BCx: NGTD 6/22 UCx: NG  6/22 CSF: NGTD 6/22 Sputum: NGTD  6/22 MRSA PCR: negative  Thank you for allowing pharmacy to be a part of this patient's care.  Naavya Postma D 04/26/2016 7:42 AM

## 2016-04-26 NOTE — Progress Notes (Addendum)
[place] for electrolyte management   No Known Allergies  Patient Measurements: Height: 5\' 3"  (160 cm) Weight: 181 lb 14.1 oz (82.5 kg) IBW/kg (Calculated) : 52.4   Vital Signs: Temp: 98.5 F (36.9 C) (06/24 0500) Temp Source: Oral (06/24 0500) BP: 106/55 mmHg (06/24 0600) Pulse Rate: 74 (06/24 0600) Intake/Output from previous day: 06/23 0701 - 06/24 0700 In: 3904 [I.V.:1997.2; NG/GT:1006.8; IV Piggyback:900] Out: 2750 [Urine:2750] Intake/Output from this shift:    Labs:  Recent Labs  04/24/16 0750  04/24/16 1138 04/25/16 0410 04/25/16 1415 04/26/16 0500  WBC 11.6*  --  20.2* 11.0  --   --   HGB 13.5  --  13.3 10.8*  --   --   HCT 38.7  --  38.6 31.7*  --   --   PLT 226  --  269 203  --   --   CREATININE 0.94  --  1.24* 0.80  --  0.62  MG  --   < > 1.8 2.2 2.1 1.9  PHOS  --   < > 1.7* 2.5 2.2* 2.3*  ALBUMIN 4.2  --  4.3  --   --   --   PROT 7.2  --  7.5  --   --   --   AST 29  --  39  --   --   --   ALT 19  --  20  --   --   --   ALKPHOS 47  --  49  --   --   --   BILITOT 0.7  --  0.5  --   --   --   < > = values in this interval not displayed. Estimated Creatinine Clearance: 67.5 mL/min (by C-G formula based on Cr of 0.62).   Assessment: Pharmacy consulted for electrolyte management for 70 yo female admitted with possible meningitis and requiring mechanical ventilation.   Electrolytes within normal limits.   Plan:  Order KCl 40 mEq PO solution x 1.  Will recheck electrolytes with am labs.    Pharmacy will continue to monitor and adjust per consult.    James,Teldrin D 04/26/2016,7:47 AM   Patient has not yet passed swallow study so will use IV KCl to replace potassium and f/u AM labs.

## 2016-04-26 NOTE — Progress Notes (Signed)
[place] Progress Note Patient Name: Cathy Harrington DOB: 12/02/1945 MRN: [id number]   Date of Service  04/26/2016  HPI/Events of Note  K 3.2. Creatinine 0.62. Mag 1.9. Has OGT.  eICU Interventions  KCl 33mEq VT x1     Intervention Category Intermediate Interventions: Electrolyte abnormality - evaluation and management  Tera Partridge 04/26/2016, 6:39 AM

## 2016-04-26 NOTE — Progress Notes (Signed)
Pt off sedation since 0750. Pt alert and able to follow commands. Pt placed on pressure support mode on ventilator at 0845. Pt continued to be stable and Dr. Stevenson Clinch ordered to have pt extubated. Tube feeds stopped. Pt extubated. Orogastric tube pulled. Pt placed on 2 liters oxygen via nasal cannula. [place] E 2:30 PM 04/26/2016

## 2016-04-26 NOTE — Progress Notes (Signed)
Placed on high fowlers position, cuff deflated, suctioned orally and endotracheally and then extubated to 3 lpm O2 [place]

## 2016-04-26 NOTE — Progress Notes (Signed)
Pharmacy Antibiotic Note  Cathy Harrington is a 70 y.o. female admitted on 04/24/2016 with meningitis.  Pharmacy has been consulted for acyclovir dosing for possible viral meningitis.  Plan: Acyclovir 800mg  IV Q8hr.   Height: 5\' 3"  (160 cm) Weight: 181 lb 14.1 oz (82.5 kg) IBW/kg (Calculated) : 52.4  Temp (24hrs), Avg:101.2 F (38.4 C), Min:98.5 F (36.9 C), Max:102.6 F (39.2 C)   Recent Labs Lab 04/24/16 0750 04/24/16 1138 04/24/16 1405 04/25/16 0410 04/25/16 1100 04/25/16 1610 04/26/16 0500  WBC 11.6* 20.2*  --  11.0  --   --   --   CREATININE 0.94 1.24*  --  0.80  --   --  0.62  LATICACIDVEN  --  1.4 1.4  --  0.9 0.7  --     Estimated Creatinine Clearance: 67.5 mL/min (by C-G formula based on Cr of 0.62).    No Known Allergies  Antimicrobials this admission: Acyclovir 6/22 >>  Ampicillin 6/22 >> 6/23 Ceftriaxone 6/22 >> 6/23 Vancomycin 6/22 >> 6/23  Dose adjustments this admission: 6/23 Acyclovir transitioned to Q8hr dosing.   Microbiology results: 6/22 BCx: no growth x 1 day  6/22 UCx: no growth  6/22 Sputum: no growth < 24 hours 6/22 MRSA PCR: negative   6/22 CSF: no growth < 24 hours  Pharmacy will continue to monitor and adjust per consult.    Tien Aispuro D 04/26/2016 7:46 AM

## 2016-04-26 NOTE — Progress Notes (Signed)
[facility] Consultation      Name: Cathy Harrington MRN: [id number] DOB: [birth year]    ADMISSION DATE:  04/24/2016  CHIEF COMPLAINT:   Acute resp failure from acute seizures   HISTORY OF PRESENT ILLNESS  Remains intubated, sedated Will attempt SAT/SBT today Assess neuro status  LP resutlrs likely c/wpossible viral meningitis aseptic   Subjective: No acute issues overnight. Remains sedated  Review of Systems  Unable to perform ROS: critical illness    VITAL SIGNS    Temp:  [98.5 F (36.9 C)-102.6 F (39.2 C)] 98.5 F (36.9 C) (06/24 0500) Pulse Rate:  [62-123] 74 (06/24 0600) Resp:  [14-16] 16 (06/24 0600) BP: (96-133)/(35-83) 106/55 mmHg (06/24 0600) SpO2:  [97 %-100 %] 100 % (06/24 0600) FiO2 (%):  [30 %-40 %] 30 % (06/24 0347) Weight:  [181 lb 14.1 oz (82.5 kg)] 181 lb 14.1 oz (82.5 kg) (06/24 0500) HEMODYNAMICS: CVP:  [6 mmHg-15 mmHg] 8 mmHg VENTILATOR SETTINGS: Vent Mode:  [-] PRVC FiO2 (%):  [30 %-40 %] 30 % Set Rate:  [16 bmp] 16 bmp Vt Set:  [500 mL] 500 mL PEEP:  [5 cmH20] 5 cmH20 INTAKE / OUTPUT:  Intake/Output Summary (Last 24 hours) at 04/26/16 0649 Last data filed at 04/26/16 0600  Gross per 24 hour  Intake 4096.47 ml  Output   2750 ml  Net 1346.47 ml     PHYSICAL EXAM   Physical Exam  Constitutional: No distress.  sedated  HENT:  Head: Normocephalic and atraumatic.  Eyes: Pupils are equal, round, and reactive to light. No scleral icterus.  Neck: Normal range of motion. Neck supple.  Cardiovascular: Normal rate and regular rhythm.   No murmur heard. Pulmonary/Chest: She has no wheezes. She has rales.  Bilateral airflow  Abdominal: Soft. She exhibits no distension. There is no tenderness.  Musculoskeletal: She exhibits no edema.  Neurological: She displays normal reflexes. Coordination normal.  gcs<8T  Skin: Skin is warm.    LABS   LABS:  CBC  Recent Labs Lab 04/24/16 0750 04/24/16 1138  04/25/16 0410  WBC 11.6* 20.2* 11.0  HGB 13.5 13.3 10.8*  HCT 38.7 38.6 31.7*  PLT 226 269 203   Coag'Harrington No results for input(Harrington): APTT, INR in the last 168 hours. BMET  Recent Labs Lab 04/24/16 1138 04/25/16 0410 04/26/16 0500  NA 130* 132* 135  K 3.2* 3.5 3.2*  CL 91* 104 104  CO2 18* 23 25  BUN 15 10 10   CREATININE 1.24* 0.80 0.62  GLUCOSE 141* 131* 173*   Electrolytes  Recent Labs Lab 04/24/16 1138 04/25/16 0410 04/25/16 1415 04/26/16 0500  CALCIUM 8.4* 6.7*  --  7.4*  MG 1.8 2.2 2.1 1.9  PHOS 1.7* 2.5 2.2* 2.3*   Sepsis Markers  Recent Labs Lab 04/24/16 1405 04/25/16 1100 04/25/16 1610  LATICACIDVEN 1.4 0.9 0.7   ABG  Recent Labs Lab 04/24/16 1230 04/25/16 0451  PHART 7.42 7.47*  PCO2ART 35 31*  PO2ART 144* 96   Liver Enzymes  Recent Labs Lab 04/24/16 0750 04/24/16 1138  AST 29 39  ALT 19 20  ALKPHOS 47 49  BILITOT 0.7 0.5  ALBUMIN 4.2 4.3   Cardiac Enzymes  Recent Labs Lab 04/24/16 0750  TROPONINI <0.03   Glucose  Recent Labs Lab 04/25/16 0534 04/25/16 0811 04/25/16 1609 04/25/16 1931 04/25/16 2332 04/26/16 0350  GLUCAP 137* 127* 124* 162* 161* 150*     Recent Results (from the past 240 hour(Harrington))  Blood culture (  routine x 2)     Status: None (Preliminary result)   Collection Time: 04/24/16 11:38 AM  Result Value Ref Range Status   Specimen Description BLOOD RIGHT ASSIST CONTROL  Final   Special Requests   Final    BOTTLES DRAWN AEROBIC AND ANAEROBIC  AERO 20CC ANA [place]   Culture NO GROWTH 1 DAY  Final   Report Status PENDING  Incomplete  CSF culture     Status: None (Preliminary result)   Collection Time: 04/24/16 11:38 AM  Result Value Ref Range Status   Specimen Description CSF  Final   Special Requests Normal  Final   Gram Stain RARE WBC SEEN NO ORGANISMS SEEN   Final   Culture   Final    NO GROWTH < 24 HOURS Performed at [facility]    Report Status PENDING  Incomplete  Culture, blood (routine  x 2)     Status: None (Preliminary result)   Collection Time: 04/24/16 11:38 AM  Result Value Ref Range Status   Specimen Description BLOOD RIGHT ASSIST CONTROL  Final   Special Requests   Final    BOTTLES DRAWN AEROBIC AND ANAEROBIC  AERO 10CC ANA [place]   Culture NO GROWTH < 24 HOURS  Final   Report Status PENDING  Incomplete  MRSA PCR Screening     Status: None   Collection Time: 04/24/16  3:20 PM  Result Value Ref Range Status   MRSA by PCR NEGATIVE NEGATIVE Final    Comment:        The GeneXpert MRSA Assay (FDA approved for NASAL specimens only), is one component of a comprehensive MRSA colonization surveillance program. It is not intended to diagnose MRSA infection nor to guide or monitor treatment for MRSA infections.   Urine culture     Status: None   Collection Time: 04/24/16  4:31 PM  Result Value Ref Range Status   Specimen Description URINE, CLEAN CATCH  Final   Special Requests Normal  Final   Culture NO GROWTH Performed at [facility]   Final   Report Status 04/25/2016 FINAL  Final  Culture, respiratory (NON-Expectorated)     Status: None (Preliminary result)   Collection Time: 04/24/16  4:31 PM  Result Value Ref Range Status   Specimen Description TRACHEAL ASPIRATE  Final   Special Requests NONE  Final   Gram Stain   Final    ABUNDANT WBC PRESENT,BOTH PMN AND MONONUCLEAR FEW GRAM POSITIVE COCCI IN PAIRS RARE GRAM VARIABLE ROD    Culture   Final    NO GROWTH < 24 HOURS Performed at [facility]    Report Status PENDING  Incomplete     Current facility-administered medications:  .  0.9 %  sodium chloride infusion, 250 mL, Intravenous, PRN, Flora Lipps, MD .  0.9 %  sodium chloride infusion, 250 mL, Intravenous, PRN, Flora Lipps, MD .  0.9 %  sodium chloride infusion, , Intravenous, Continuous, Bincy Harrington Varughese, NP, Last Rate: 100 mL/hr at 04/25/16 2348 .  acetaminophen (TYLENOL) tablet 650 mg, 650 mg, Oral, Q4H PRN, Flora Lipps, MD, 650  mg at 04/25/16 0828 .  acyclovir (ZOVIRAX) 800 mg in dextrose 5 % 150 mL IVPB, 800 mg, Intravenous, Q8H, Flora Lipps, MD, 800 mg at 04/26/16 0518 .  antiseptic oral rinse solution (CORINZ), 7 mL, Mouth Rinse, 10 times per day, Flora Lipps, MD, 7 mL at 04/26/16 0540 .  cefTRIAXone (ROCEPHIN) 2 g in dextrose 5 % 50 mL  IVPB, 2 g, Intravenous, Q12H, Leonel Ramsay, MD, 2 g at 04/26/16 0517 .  chlorhexidine gluconate (SAGE KIT) (PERIDEX) 0.12 % solution 15 mL, 15 mL, Mouth Rinse, BID, Flora Lipps, MD, 15 mL at 04/25/16 2000 .  enoxaparin (LOVENOX) injection 40 mg, 40 mg, Subcutaneous, Q24H, Flora Lipps, MD, 40 mg at 04/25/16 1400 .  famotidine (PEPCID) tablet 20 mg, 20 mg, Oral, BID, Flora Lipps, MD, 20 mg at 04/25/16 2244 .  feeding supplement (PRO-STAT SUGAR FREE 64) liquid 30 mL, 30 mL, Per Tube, Daily, Flora Lipps, MD, 30 mL at 04/25/16 1600 .  feeding supplement (VITAL HIGH PROTEIN) liquid 1,000 mL, 1,000 mL, Per Tube, Q24H, Flora Lipps, MD, 1,000 mL at 04/25/16 1648 .  fentaNYL (SUBLIMAZE) bolus via infusion 25 mcg, 25 mcg, Intravenous, Q1H PRN, Bincy Harrington Varughese, NP, 25 mcg at 04/25/16 1300 .  fentaNYL (SUBLIMAZE) injection 50 mcg, 50 mcg, Intravenous, Once, Bincy Harrington Varughese, NP, 50 mcg at 04/24/16 1514 .  fentaNYL 2541mg in NS 2542m(102mml) infusion-PREMIX, 25-400 mcg/hr, Intravenous, Continuous, Bincy Harrington Varughese, NP, Last Rate: 30 mL/hr at 04/26/16 0625, 300 mcg/hr at 04/26/16 0625 .  free water 100 mL, 100 mL, Per Tube, Q8H, KurFlora LippsD, 100 mL at 04/26/16 0600 .  insulin aspart (novoLOG) injection 2-6 Units, 2-6 Units, Subcutaneous, Q4H, KurFlora LippsD, 2 Units at 04/26/16 0411 .  levETIRAcetam (KEPPRA) 500 mg in sodium chloride 0.9 % 100 mL IVPB, 500 mg, Intravenous, Q12H, Bincy Harrington Varughese, NP, 500 mg at 04/25/16 2349 .  levothyroxine (SYNTHROID, LEVOTHROID) tablet 100 mcg, 100 mcg, Oral, QAC breakfast, Bincy Harrington Varughese, NP, 100 mcg at 04/26/16 0540 .  midazolam (VERSED)  injection 1 mg, 1 mg, Intravenous, Q2H PRN, Bincy Harrington Varughese, NP, 1 mg at 04/26/16 0537 .  norepinephrine (LEVOPHED) 4mg23m D5W 250mL30mmix infusion, 2-50 mcg/min, Intravenous, Titrated, KuriaFlora Lipps Last Rate: 7.5 mL/hr at 04/26/16 0240, 2 mcg/min at 04/26/16 0240 .  ondansetron (ZOFRAN) injection 4 mg, 4 mg, Intravenous, Q6H PRN, KuriaFlora Lipps.  potassium chloride 20 MEQ/15ML (10%) solution 40 mEq, 40 mEq, Per Tube, Once, JenniJavier Glazier.  sodium chloride flush (NS) 0.9 % injection 3 mL, 3 mL, Intravenous, Q12H, KuriaFlora Lipps 3 mL at 04/25/16 2200 .  sodium chloride flush (NS) 0.9 % injection 3 mL, 3 mL, Intravenous, PRN, KuriaFlora Lipps.  vancomycin (VANCOCIN) IVPB 750 mg/150 ml premix, 750 mg, Intravenous, Q12H, DavidLeonel Ramsay 750 mg at 04/26/16 0517 [id number]    Mr BrainJeri Harrington  04/25/2016  ADDENDUM REPORT: 04/25/2016 15:19 ADDENDUM: These results were called by telephone at the time of interpretation on 04/25/2016 at 3:19 pm to Dr. MongaBenjie Karvoneno verbally acknowledged these results. Electronically Signed   By: CharlFranchot Gallo   On: 04/25/2016 15:19  04/25/2016  CLINICAL DATA:  Seizure.  History of fever in the emergency room EXAM: MRI HEAD WITHOUT AND WITH CONTRAST TECHNIQUE: Multiplanar, multiecho pulse sequences of the brain and surrounding structures were obtained without and with intravenous contrast. CONTRAST:  17mL 32mIHANCE GADOBENATE DIMEGLUMINE 529 MG/ML IV SOLN COMPARISON:  CT head 04/24/2016 FINDINGS: Abnormal T2 signal in the left hippocampus and medial temporal lobe extending into the insular cortex. There is also mild hyperintensity in the left cingulate gyrus. These areas show hyperintense signal on diffusion-weighted imaging without definite restricted diffusion. No associated hemorrhage. Mild enhancement in the left insula and left hippocampus postcontrast administration. No other areas  of abnormal signal.  Ventricle size normal. Negative for  hemorrhage or mass lesion. Pituitary normal in size.  Normal orbit. Mucous retention cysts in the maxillary sinus bilaterally. Mucosal edema in the paranasal sinuses. IMPRESSION: Diffuse signal abnormality in the left limbic system including the hippocampus and insula and left cingulate gyrus. There is mild enhancement postcontrast. Differential includes herpes encephalitis, especially given fever. 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 Electronically Signed: By: Franchot Gallo M.D. On: 04/25/2016 15:04     Indwelling Urinary Catheter continued, requirement due to   Reason to continue Indwelling Urinary Catheter for strict Intake/Output monitoring for hemodynamic instability   Central Line continued, requirement due to   Reason to continue [employer] Monitoring of central venous pressure or other hemodynamic parameters   Ventilator continued, requirement due to, resp failure    Ventilator Sedation RASS 0 to -2   STUDIES:  6/22 LP>>  CULTURES: 6/22 blood culture>> 6/22 urine culture>> No growth to date 04/24/16: WBCs, gram-positive cocci in pairs MRSA screen negative  ANTIBIOTICS: 6/22 vancomycin>> 6/22 ceftriaxone>> 6/22 acyclovir>> 6/22 ampicillin>>  SIGNIFICANT EVENTS: 6/22>>Patient presented to [facility] with Altered mental status, febrile and ended up having seizures requiring intubation and now on mechanically ventilated  LINES/TUBES: 6/22 ET tube>> 6/22 [facility] CVL>>>   DISCUSSION: 70 yo female with Hx of DM-Type2, hypothyroidism , UTI now presenting with Altered mental Status, Hyperthermia and one episode of seizures in the ED requiring emergent intubation.MAJOR EVENTS/TEST RESULTS:  ASSESSMENT/PLAN   70 yo female with Hx of DM-Type2, hypothyroidism , UTI now presenting with Altered mental Status, Hyperthermia and one episode of seizures in the ED requiring emergent intubation.   ASSESSMENT /  PLAN:  NEUROLOGIC  Seizures ?Encephalitis/ meningitis-MRI consistent with encephalitis; exact etiology unclear; fine CSF cultures pending Altered mental status P:  RASS goal: 0- -1 Neurology consult appreciated Vanc/.ceftriaxone/ ampicillin/ acyclovir Continue Kepra  6/22 LP performed, CSF concerning for elevated protein  PULMONARY A: Acute hypoxemic/hypercarbic respiratory failure related to seizures ?Aspiration P:  Full vent support SBT/SAt today, assess underlying neuro status Fentanyl/ Versed as needed  CARDIOVASCULAR A:  No active issues P:  Continuous ICU monitoring Keep MAP goals>65  RENAL A:  Acute kidney Injury Hyponatremia hypokalemia UTI P:  Replace electrolytes per ICU protocol Follow chemistry  GASTROINTESTINAL A:  No active issues P:  Pepcid for GI prohylaxis  HEMATOLOGIC A:  No active issues P:  scds lovenox for DVT prophylaxis Transfuse if Hgb<7  INFECTIOUS A:  Leucocytosis Meningitis P:  Follow cultures Continue vancomycin/ ceftriaxone, acylovir/ ampicillin  ENDOCRINE A:  Diabetese Melitus  Hypothyroidism P:  Blood sugar checks SSI coverage Continue Synthroid  Disposition and family update: No family at bedside. We will updated when available.   Cathy Harrington. [facility] ANP-BC Pulmonary and Critical Care Medicine [facility] Pager [phone] or [phone]

## 2016-04-27 ENCOUNTER — Encounter: Payer: Self-pay | Admitting: *Deleted

## 2016-04-27 DIAGNOSIS — R404 Transient alteration of awareness: Secondary | ICD-10-CM

## 2016-04-27 LAB — CBC
HEMATOCRIT: 32.1 % — AB (ref 35.0–47.0)
Hemoglobin: 11 g/dL — ABNORMAL LOW (ref 12.0–16.0)
MCH: 28.9 pg (ref 26.0–34.0)
MCHC: 34.2 g/dL (ref 32.0–36.0)
MCV: 84.6 fL (ref 80.0–100.0)
PLATELETS: 143 10*3/uL — AB (ref 150–440)
RBC: 3.79 MIL/uL — ABNORMAL LOW (ref 3.80–5.20)
RDW: 14.4 % (ref 11.5–14.5)
WBC: 6.3 10*3/uL (ref 3.6–11.0)

## 2016-04-27 LAB — GLUCOSE, CAPILLARY
GLUCOSE-CAPILLARY: 113 mg/dL — AB (ref 65–99)
GLUCOSE-CAPILLARY: 138 mg/dL — AB (ref 65–99)
GLUCOSE-CAPILLARY: 119 mg/dL — AB (ref 65–99)
GLUCOSE-CAPILLARY: 118 mg/dL — AB (ref 65–99)
Glucose-Capillary: 104 mg/dL — ABNORMAL HIGH (ref 65–99)

## 2016-04-27 LAB — BASIC METABOLIC PANEL
Anion gap: 7 (ref 5–15)
BUN: 7 mg/dL (ref 6–20)
CALCIUM: 7.9 mg/dL — AB (ref 8.9–10.3)
CO2: 27 mmol/L (ref 22–32)
CREATININE: 0.64 mg/dL (ref 0.44–1.00)
Chloride: 102 mmol/L (ref 101–111)
GFR calc Af Amer: >60 mL/min (ref >60)
GLUCOSE: 111 mg/dL — AB (ref 65–99)
Potassium: 3.6 mmol/L (ref 3.5–5.1)
SODIUM: 136 mmol/L (ref 135–145)

## 2016-04-27 LAB — CULTURE, RESPIRATORY: CULTURE: NORMAL

## 2016-04-27 LAB — CULTURE, RESPIRATORY W GRAM STAIN

## 2016-04-27 LAB — VANCOMYCIN, TROUGH: VANCOMYCIN TR: 7 ug/mL — AB (ref 10–20)

## 2016-04-27 MED ORDER — VANCOMYCIN HCL IN DEXTROSE 1-5 GM/200ML-% IV SOLN
1000.0000 mg | Freq: Three times a day (TID) | INTRAVENOUS | Status: DC
  Administered 2016-04-28 (×2): 1000 mg via INTRAVENOUS
  Filled 2016-04-27 (×4): qty 200

## 2016-04-27 MED ORDER — PNEUMOCOCCAL VAC POLYVALENT 25 MCG/0.5ML IJ INJ: 0.5000 mL | INJECTION | INTRAMUSCULAR | Status: DC

## 2016-04-27 NOTE — Progress Notes (Signed)
[facility] Consultation    Name: Cathy Harrington MRN: [id number] DOB: [birth year]    ADMISSION DATE:  04/24/2016  CHIEF COMPLAINT:   Acute resp failure from acute seizures   HISTORY OF PRESENT ILLNESS  Remains intubated, sedated Will attempt SAT/SBT today Assess neuro status  LP resutlrs likely c/wpossible viral meningitis aseptic   Subjective: No acute issues overnight. Doing well post-extubation on [place]. Remains confused.   Review of Systems  Unable to perform ROS: critical illness    VITAL SIGNS    Temp:  [97.8 F (36.6 C)-99.2 F (37.3 C)] 99.2 F (37.3 C) (06/25 0000) Pulse Rate:  [71-101] 84 (06/25 0200) Resp:  [16-25] 24 (06/25 0200) BP: (102-140)/(50-94) 134/64 mmHg (06/25 0200) SpO2:  [95 %-100 %] 98 % (06/25 0200) FiO2 (%):  [30 %] 30 % (06/24 0826) Weight:  [181 lb 14.1 oz (82.5 kg)] 181 lb 14.1 oz (82.5 kg) (06/24 0500) HEMODYNAMICS: CVP:  [8 mmHg-15 mmHg] 12 mmHg VENTILATOR SETTINGS: Vent Mode:  [-] PSV;CPAP FiO2 (%):  [30 %] 30 % Set Rate:  [16 bmp] 16 bmp Vt Set:  [500 mL] 500 mL PEEP:  [5 cmH20] 5 cmH20 Pressure Support:  [5 cmH20] 5 cmH20 INTAKE / OUTPUT:  Intake/Output Summary (Last 24 hours) at 04/27/16 0413 Last data filed at 04/27/16 0200  Gross per 24 hour  Intake 2990.5 ml  Output   2250 ml  Net  740.5 ml     PHYSICAL EXAM   Physical Exam  Constitutional: No distress.  sedated  HENT:  Head: Normocephalic and atraumatic.  Eyes: Pupils are equal, round, and reactive to light. No scleral icterus.  Neck: Normal range of motion. Neck supple.  Cardiovascular: Normal rate and regular rhythm.   No murmur heard. Pulmonary/Chest: She has no wheezes. She has rales.  Bilateral airflow; profound rhonchi in upper lung fields  Abdominal: Soft. She exhibits no distension. There is no tenderness.  Musculoskeletal: She exhibits no edema.  Neurological: She displays normal reflexes. Coordination normal.  gcs<8T  Skin:  Skin is warm.    LABS   LABS:  CBC  Recent Labs Lab 04/24/16 0750 04/24/16 1138 04/25/16 0410  WBC 11.6* 20.2* 11.0  HGB 13.5 13.3 10.8*  HCT 38.7 38.6 31.7*  PLT 226 269 203   Coag's No results for input(s): APTT, INR in the last 168 hours. BMET  Recent Labs Lab 04/24/16 1138 04/25/16 0410 04/26/16 0500  NA 130* 132* 135  K 3.2* 3.5 3.2*  CL 91* 104 104  CO2 18* 23 25  BUN 15 10 10   CREATININE 1.24* 0.80 0.62  GLUCOSE 141* 131* 173*   Electrolytes  Recent Labs Lab 04/24/16 1138 04/25/16 0410 04/25/16 1415 04/26/16 0500 04/26/16 1724  CALCIUM 8.4* 6.7*  --  7.4*  --   MG 1.8 2.2 2.1 1.9 1.7  PHOS 1.7* 2.5 2.2* 2.3* 2.4*   Sepsis Markers  Recent Labs Lab 04/24/16 1405 04/25/16 1100 04/25/16 1610  LATICACIDVEN 1.4 0.9 0.7   ABG  Recent Labs Lab 04/24/16 1230 04/25/16 0451  PHART 7.42 7.47*  PCO2ART 35 31*  PO2ART 144* 96   Liver Enzymes  Recent Labs Lab 04/24/16 0750 04/24/16 1138  AST 29 39  ALT 19 20  ALKPHOS 47 49  BILITOT 0.7 0.5  ALBUMIN 4.2 4.3   Cardiac Enzymes  Recent Labs Lab 04/24/16 0750  TROPONINI <0.03   Glucose  Recent Labs Lab 04/26/16 0350 04/26/16 0758 04/26/16 1229 04/26/16 1631 04/26/16 1959 04/26/16  2358  GLUCAP 150* 159* 146* 92 106* 115*     Recent Results (from the past 240 hour(s))  Blood culture (routine x 2)     Status: None (Preliminary result)   Collection Time: 04/24/16 11:38 AM  Result Value Ref Range Status   Specimen Description BLOOD RIGHT ASSIST CONTROL  Final   Special Requests   Final    BOTTLES DRAWN AEROBIC AND ANAEROBIC  AERO 20CC ANA [place]   Culture NO GROWTH 2 DAYS  Final   Report Status PENDING  Incomplete  CSF culture     Status: None (Preliminary result)   Collection Time: 04/24/16 11:38 AM  Result Value Ref Range Status   Specimen Description CSF  Final   Special Requests Normal  Final   Gram Stain RARE WBC SEEN NO ORGANISMS SEEN   Final   Culture   Final     NO GROWTH 2 DAYS Performed at [facility]    Report Status PENDING  Incomplete  Culture, blood (routine x 2)     Status: None (Preliminary result)   Collection Time: 04/24/16 11:38 AM  Result Value Ref Range Status   Specimen Description BLOOD RIGHT ASSIST CONTROL  Final   Special Requests   Final    BOTTLES DRAWN AEROBIC AND ANAEROBIC  AERO 10CC ANA [place]   Culture NO GROWTH 2 DAYS  Final   Report Status PENDING  Incomplete  MRSA PCR Screening     Status: None   Collection Time: 04/24/16  3:20 PM  Result Value Ref Range Status   MRSA by PCR NEGATIVE NEGATIVE Final    Comment:        The GeneXpert MRSA Assay (FDA approved for NASAL specimens only), is one component of a comprehensive MRSA colonization surveillance program. It is not intended to diagnose MRSA infection nor to guide or monitor treatment for MRSA infections.   Urine culture     Status: None   Collection Time: 04/24/16  4:31 PM  Result Value Ref Range Status   Specimen Description URINE, CLEAN CATCH  Final   Special Requests Normal  Final   Culture NO GROWTH Performed at [facility]   Final   Report Status 04/25/2016 FINAL  Final  Culture, respiratory (NON-Expectorated)     Status: None (Preliminary result)   Collection Time: 04/24/16  4:31 PM  Result Value Ref Range Status   Specimen Description TRACHEAL ASPIRATE  Final   Special Requests NONE  Final   Gram Stain   Final    ABUNDANT WBC PRESENT,BOTH PMN AND MONONUCLEAR FEW GRAM POSITIVE COCCI IN PAIRS RARE GRAM VARIABLE ROD    Culture   Final    CULTURE REINCUBATED FOR BETTER GROWTH Performed at [facility]    Report Status PENDING  Incomplete     Current facility-administered medications:  .  0.9 %  sodium chloride infusion, 250 mL, Intravenous, PRN, Flora Lipps, MD .  0.9 %  sodium chloride infusion, 250 mL, Intravenous, PRN, Flora Lipps, MD .  0.9 %  sodium chloride infusion, , Intravenous, Continuous, Vishal Mungal, MD,  Last Rate: 50 mL/hr at 04/26/16 1718 .  acetaminophen (TYLENOL) tablet 650 mg, 650 mg, Oral, Q4H PRN, Flora Lipps, MD, 650 mg at 04/25/16 [id number] .  acyclovir (ZOVIRAX) 800 mg in dextrose 5 % 150 mL IVPB, 800 mg, Intravenous, Q8H, Flora Lipps, MD, 800 mg at 04/26/16 2215 .  antiseptic oral rinse (CPC / CETYLPYRIDINIUM CHLORIDE 0.05%) solution 7 mL, 7 mL,  Mouth Rinse, q12n4p, Vishal Mungal, MD, 7 mL at 04/26/16 1600 .  cefTRIAXone (ROCEPHIN) 2 g in dextrose 5 % 50 mL IVPB, 2 g, Intravenous, Q12H, Leonel Ramsay, MD, 2 g at 04/26/16 1718 .  chlorhexidine (PERIDEX) 0.12 % solution 15 mL, 15 mL, Mouth Rinse, BID, Vishal Mungal, MD, 15 mL at 04/26/16 2221 .  enoxaparin (LOVENOX) injection 40 mg, 40 mg, Subcutaneous, Q24H, Flora Lipps, MD, 40 mg at 04/26/16 1500 .  famotidine (PEPCID) tablet 20 mg, 20 mg, Oral, BID, Flora Lipps, MD, 20 mg at 04/26/16 1027 .  fentaNYL (SUBLIMAZE) bolus via infusion 25 mcg, 25 mcg, Intravenous, Q1H PRN, Bincy S Varughese, NP, 25 mcg at 04/25/16 1300 .  fentaNYL (SUBLIMAZE) injection 50 mcg, 50 mcg, Intravenous, Once, Bincy S Varughese, NP, 50 mcg at 04/24/16 1514 .  insulin aspart (novoLOG) injection 2-6 Units, 2-6 Units, Subcutaneous, Q4H, Flora Lipps, MD, 2 Units at 04/26/16 1306 .  levETIRAcetam (KEPPRA) 500 mg in sodium chloride 0.9 % 100 mL IVPB, 500 mg, Intravenous, Q12H, Bincy S Varughese, NP, 500 mg at 04/26/16 2359 .  levothyroxine (SYNTHROID, LEVOTHROID) tablet 100 mcg, 100 mcg, Oral, QAC breakfast, Bincy S Varughese, NP, 100 mcg at 04/26/16 0540 .  norepinephrine (LEVOPHED) 4mg  in D5W 226mL premix infusion, 2-50 mcg/min, Intravenous, Titrated, Flora Lipps, MD, Stopped at 04/26/16 0915 .  ondansetron (ZOFRAN) injection 4 mg, 4 mg, Intravenous, Q6H PRN, Flora Lipps, MD .  sodium chloride flush (NS) 0.9 % injection 3 mL, 3 mL, Intravenous, Q12H, Flora Lipps, MD, 3 mL at 04/26/16 0924 .  sodium chloride flush (NS) 0.9 % injection 3 mL, 3 mL, Intravenous, PRN, Flora Lipps, MD .  vancomycin (VANCOCIN) IVPB 750 mg/150 ml premix, 750 mg, Intravenous, Q12H, Leonel Ramsay, MD, 750 mg at 04/26/16 1717  IMAGING    US Venous Img Upper Uni Left  04/26/2016  CLINICAL DATA:  70 year old female with a history of swelling in the left arm EXAM: LEFT UPPER EXTREMITY VENOUS DOPPLER ULTRASOUND TECHNIQUE: Gray-scale sonography with graded compression, as well as color Doppler and duplex ultrasound were performed to evaluate the upper extremity deep venous system from the level of the subclavian vein and including the jugular, axillary, basilic, radial, ulnar and upper cephalic vein. Spectral Doppler was utilized to evaluate flow at rest and with distal augmentation maneuvers. COMPARISON:  None. FINDINGS: Contralateral Subclavian Vein: Respiratory phasicity is normal and symmetric with the symptomatic side. No evidence of thrombus. Normal compressibility. Internal Jugular Vein: No evidence of thrombus. Normal compressibility, respiratory phasicity and response to augmentation. Subclavian Vein: No evidence of thrombus. Normal compressibility, respiratory phasicity and response to augmentation. Axillary Vein: No evidence of thrombus. Normal compressibility, respiratory phasicity and response to augmentation. Cephalic Vein: No evidence of thrombus. Normal compressibility, respiratory phasicity and response to augmentation. Basilic Vein: No evidence of thrombus. Normal compressibility, respiratory phasicity and response to augmentation. Brachial Veins: No evidence of thrombus. Normal compressibility, respiratory phasicity and response to augmentation. Radial Veins: No evidence of thrombus. Normal compressibility, respiratory phasicity and response to augmentation. Ulnar Veins: No evidence of thrombus. Normal compressibility, respiratory phasicity and response to augmentation. Other Findings: Superficial venous thrombus at the left wrist associated with IV puncture site. Edema of the  forearm. IMPRESSION: Sonographic survey of the left upper extremity negative for DVT. Positive for superficial vein thrombus associated with the left wrist IV puncture site. Edema of the left upper extremity. Signed, Dulcy Fanny. Earleen Newport, DO [facility] Electronically Signed  By: Corrie Mckusick D.O.   On: 04/26/2016 18:30   [place] Extrem Up Left Ltd  04/26/2016  CLINICAL DATA:  70 year old female with a history of left arm swelling EXAM: ULTRASOUND LEFT UPPER EXTREMITY LIMITED TECHNIQUE: Ultrasound examination of the upper extremity soft tissues was performed in the area of clinical concern. COMPARISON:  None FINDINGS: Targeted grayscale and color duplex of the left upper extremity. No focal fluid collection. Extensive edema of the left upper extremity. Contemporaneously duplex demonstrates of thrombus of superficial veins of the left wrist associated with IV puncture site. IMPRESSION: Edema of the distal left upper extremity associated with thrombophlebitis. Signed, Dulcy Fanny. Earleen Newport, DO [facility] Radiology Electronically Signed   By: Corrie Mckusick D.O.   On: 04/26/2016 18:35   Dg Chest Port 1 View  04/26/2016  CLINICAL DATA:  Respiratory failure. EXAM: PORTABLE CHEST 1 VIEW COMPARISON:  04/25/2016 FINDINGS: Endotracheal tube remains present with the tip approximately 2 cm above the carina. Left jugular central line tip shows stable positioning in the upper SVC. Nasogastric tube extends into the stomach. Temporary pacing pad has been removed. Lungs show bibasilar atelectasis, slightly more prominent compared to the prior study and potentially a small left pleural effusion. No overt edema, airspace consolidation or pneumothorax identified. The heart size and mediastinal contours are normal. IMPRESSION: Slight increased prominence of bibasilar atelectasis. Probable small left pleural effusion. Electronically  Signed   By: Aletta Edouard M.D.   On: 04/26/2016 10:40     Indwelling Urinary Catheter continued, requirement due to   Reason to continue Indwelling Urinary Catheter for strict Intake/Output monitoring for hemodynamic instability   Central Line continued, requirement due to   Reason to continue [employer] Monitoring of central venous pressure or other hemodynamic parameters   Ventilator continued, requirement due to, resp failure    Ventilator Sedation RASS 0 to -2   STUDIES:  6/22 LP>>  CULTURES: 6/22 blood culture>> 6/22 urine culture>> No growth to date 04/24/16: WBCs, gram-positive cocci in pairs MRSA screen negative Sputum culture 04/24/2016-WBCs, GPC in pairs CSF culture 04/24/2016: NGTD HSV titer: pending HIV: Non reactive  ANTIBIOTICS: 6/22 vancomycin>> 6/22 ceftriaxone>> 6/22 acyclovir>> 6/22 ampicillin>>04/25/2016  SIGNIFICANT EVENTS: 6/22>>Patient presented to [facility] with Altered mental status, febrile and ended up having seizures requiring intubation and now on mechanically ventilated  LINES/TUBES: 6/22 ET tube>>04/26/16 6/22 [facility] CVL>>>   DISCUSSION: 70 yo female with Hx of DM-Type2, hypothyroidism , UTI now presenting with Altered mental Status, Hyperthermia and one episode of seizures in the ED requiring emergent intubation.MAJOR EVENTS/TEST RESULTS:  ASSESSMENT/PLAN   70 yo female with Hx of DM-Type2, hypothyroidism , UTI now presenting with Altered mental Status, Hyperthermia and one episode of seizures in the ED requiring emergent intubation.   ASSESSMENT / PLAN:  NEUROLOGIC  Seizures ?Encephalitis/ meningitis-MRI consistent with encephalitis; exact etiology unclear; final CSF cultures pending Altered mental status P:  RASS goal: 0 Neurology consult appreciated Vanc/.ceftriaxone/ acyclovir Continue Kepra  6/22 LP performed, CSF concerning for elevated protein  PULMONARY A: Acute hypoxemic/hypercarbic respiratory failure  related to seizures s/p extubation 06/24 ?Aspiration P:  Supplemental O2 prn Chest PT, flutter valve and IS WA Nasotracheal suctioning prn Monitor respiratory status closely as patient is at increased risk for re-intubation  CARDIOVASCULAR A:  No active issues P:  Continuous ICU monitoring Keep MAP goals>65  RENAL A:  Acute kidney Injury Hyponatremia hypokalemia UTI P:  Replace electrolytes per ICU protocol Follow chemistry  GASTROINTESTINAL A:  No active issues P:  Pepcid for GI prohylaxis  HEMATOLOGIC A:  No active issues P:  scds lovenox for DVT prophylaxis Transfuse if Hgb<7  INFECTIOUS A:  Leucocytosis Meningitis P:  Follow cultures Continue vancomycin/ ceftriaxone and acyclovir  ENDOCRINE A:  Diabetese Melitus  Hypothyroidism P:  Blood sugar checks SSI coverage Continue Synthroid  Disposition and family update: No family at bedside. We will updated when available.   Magdalene S. [facility] ANP-BC Pulmonary and Critical Care Medicine [facility] Pager [phone] or [phone]

## 2016-04-27 NOTE — Progress Notes (Signed)
[place] for electrolyte management   No Known Allergies  Patient Measurements: Height: 5\' 3"  (160 cm) Weight: 189 lb 2.5 oz (85.8 kg) IBW/kg (Calculated) : 52.4   Vital Signs: Temp: 98.9 F (37.2 C) (06/25 0500) Temp Source: Axillary (06/25 0500) BP: 118/65 mmHg (06/25 0700) Pulse Rate: 74 (06/25 0700) Intake/Output from previous day: 06/24 0701 - 06/25 0700 In: 3975.5 [I.V.:1850.5; IV Piggyback:1000] Out: 2575 [Urine:2575] Intake/Output from this shift:    Labs:  Recent Labs  04/24/16 1138 04/25/16 0410 04/25/16 1415 04/26/16 0500 04/26/16 1724 04/27/16 0505  WBC 20.2* 11.0  --   --   --  6.3  HGB 13.3 10.8*  --   --   --  11.0*  HCT 38.6 31.7*  --   --   --  32.1*  PLT 269 203  --   --   --  143*  CREATININE 1.24* 0.80  --  0.62  --  0.64  MG 1.8 2.2 2.1 1.9 1.7  --   PHOS 1.7* 2.5 2.2* 2.3* 2.4*  --   ALBUMIN 4.3  --   --   --   --   --   PROT 7.5  --   --   --   --   --   AST 39  --   --   --   --   --   ALT 20  --   --   --   --   --   ALKPHOS 49  --   --   --   --   --   BILITOT 0.5  --   --   --   --   --    Estimated Creatinine Clearance: 68.9 mL/min (by C-G formula based on Cr of 0.64).   Assessment: Pharmacy consulted for electrolyte management for 70 yo female admitted with possible meningitis and requiring mechanical ventilation.   Electrolytes within normal limits.   Plan:  No supplementation warranted @ this time. Will recheck Electrolytes in am.  Kevin Space D 04/27/2016,8:00 AM

## 2016-04-27 NOTE — Progress Notes (Signed)
Pharmacy Antibiotic Note  Cathy Harrington is a 70 y.o. female admitted on 04/24/2016 with meningitis.  Pharmacy consulted for vancomycin and ceftriaxone dosing. Currently order Vancomycin 750mg  IV q12h and Ceftriaxone 2g IV q12h.  6/25 Vanc trough resulted at 1mcg/ml.  Plan: Will increase to Vancomycin 1g IV q8h. Goal trough 15-20 mcg/mL. Will recheck Vanc trough on 6/27 prior to 0900 dose. Continue Ceftriaxone 2 g iv q 12 hours.  Will f/u culture results. Suspected viral meningitis so can d/c abx if culture negative per ID.   Height: 5\' 3"  (160 cm) Weight: 189 lb 2.5 oz (85.8 kg) IBW/kg (Calculated) : 52.4  Temp (24hrs), Avg:98.9 F (37.2 C), Min:98.5 F (36.9 C), Max:99.2 F (37.3 C)   Recent Labs Lab 04/24/16 0750 04/24/16 1138 04/24/16 1405 04/25/16 0410 04/25/16 1100 04/25/16 1610 04/26/16 0500 04/27/16 0505 04/27/16 1752  WBC 11.6* 20.2*  --  11.0  --   --   --  6.3  --   CREATININE 0.94 1.24*  --  0.80  --   --  0.62 0.64  --   LATICACIDVEN  --  1.4 1.4  --  0.9 0.7  --   --   --   VANCOTROUGH  --   --   --   --   --   --   --   --  7*    Estimated Creatinine Clearance: 68.9 mL/min (by C-G formula based on Cr of 0.64).    No Known Allergies  Antimicrobials this admission: acyclovir 6/22 >>  ampicillin 6/22 >> 6/23 Ceftriaxone 6/22 >> vancomycin 6/22 >>  Dose adjustments this admission: 6/25 Increased Vanc 1g IV q8h  Microbiology results: 6/22 BCx: NGTD 6/22 UCx: NG  6/22 CSF: NGTD 6/22 Sputum: NGTD  6/22 MRSA PCR: negative  Thank you for allowing pharmacy to be a part of this patient's care.  Paulina Fusi, PharmD, BCPS 04/27/2016 8:36 PM

## 2016-04-27 NOTE — Progress Notes (Signed)
eLink Physician-Brief Progress Note Patient Name: Cathy Harrington DOB: Aug 04, 1946 MRN: [id number]   Date of Service  04/27/2016  HPI/Events of Note  Camera check on patient postextubation. Resting comfortably. Eyes closed. Saturating 94% on nasal cannula with respiratory rate 25-27 breaths per minute.   eICU Interventions  Continue ICU monitoring.      Intervention Category Major Interventions: Respiratory failure - evaluation and management  Tera Partridge 04/27/2016, 1:45 AM

## 2016-04-27 NOTE — Progress Notes (Signed)
Subjective: Patient s/p extubation. Still doesn't follow commands.    Objective: Current vital signs: BP 131/66 mmHg  Pulse 67  Temp(Src) 98.9 F (37.2 C) (Axillary)  Resp 23  Ht 5\' 3"  (1.6 m)  Wt 85.8 kg (189 lb 2.5 oz)  BMI 33.52 kg/m2  SpO2 100% Vital signs in last 24 hours: Temp:  [97.8 F (36.6 C)-99.2 F (37.3 C)] 98.9 F (37.2 C) (06/25 1135) Pulse Rate:  [67-101] 67 (06/25 1200) Resp:  [16-26] 23 (06/25 1200) BP: (113-140)/(53-79) 131/66 mmHg (06/25 1200) SpO2:  [95 %-100 %] 100 % (06/25 1200) Weight:  [85.8 kg (189 lb 2.5 oz)] 85.8 kg (189 lb 2.5 oz) (06/25 0457)  Intake/Output from previous day: 06/24 0701 - 06/25 0700 In: 3975.5 [I.V.:1850.5; IV Piggyback:1000] Out: 2575 [Urine:2575] Intake/Output this shift: Total I/O In: 355 [I.V.:250; IV Piggyback:105] Out: 900 [Urine:900] Nutritional status: Diet NPO time specified  Neurologic Exam: Mental Status: Eyes open with light sternal rub. Does not follow commands. Mumbles.  Cranial Nerves: II: patient does not respond confrontation bilaterally, pupils right 2 mm, left 2 mm,and sluggishly reactive bilaterally III,IV,VI: Oculocephalic maneuvers intact bilaterally  V,VII: corneal reflex present bilaterally  VIII: grossly intact IX,X: gag reflex reduced, XI: trapezius strength unable to test bilaterally XII: unable to test Motor: Moves all extremities with no focal weakness noted.  Sensory: Responds to noxious stimuli in all extremities. Deep Tendon Reflexes:  1+ in the upper extremities and absent in the lower extremities   Lab Results: Basic Metabolic Panel:  Recent Labs Lab 04/24/16 0750 04/24/16 1138 04/25/16 0410 04/25/16 1415 04/26/16 0500 04/26/16 1724 04/27/16 0505  NA 126* 130* 132*  --  135  --  136  K 3.8 3.2* 3.5  --  3.2*  --  3.6  CL 92* 91* 104  --  104  --  102  CO2 23 18* 23  --  25  --  27  GLUCOSE 149* 141* 131*  --  173*  --  111*  BUN 17 15 10   --  10  --  7   CREATININE 0.94 1.24* 0.80  --  0.62  --  0.64  CALCIUM 8.6* 8.4* 6.7*  --  7.4*  --  7.9*  MG  --  1.8 2.2 2.1 1.9 1.7  --   PHOS  --  1.7* 2.5 2.2* 2.3* 2.4*  --     Liver Function Tests:  Recent Labs Lab 04/24/16 0750 04/24/16 1138  AST 29 39  ALT 19 20  ALKPHOS 47 49  BILITOT 0.7 0.5  PROT 7.2 7.5  ALBUMIN 4.2 4.3   No results for input(s): LIPASE, AMYLASE in the last 168 hours. No results for input(s): AMMONIA in the last 168 hours.  CBC:  Recent Labs Lab 04/24/16 0750 04/24/16 1138 04/25/16 0410 04/27/16 0505  WBC 11.6* 20.2* 11.0 6.3  NEUTROABS 8.4* 11.7*  --   --   HGB 13.5 13.3 10.8* 11.0*  HCT 38.7 38.6 31.7* 32.1*  MCV 82.1 85.3 82.8 84.6  PLT 226 269 203 143*    Cardiac Enzymes:  Recent Labs Lab 04/24/16 0750  TROPONINI <0.03    Lipid Panel: No results for input(s): CHOL, TRIG, HDL, CHOLHDL, VLDL, LDLCALC in the last 168 hours.  CBG:  Recent Labs Lab 04/26/16 1959 04/26/16 2358 04/27/16 0415 04/27/16 0739 04/27/16 1155  GLUCAP 106* 115* 113* 138* 119*    Microbiology: Results for orders placed or performed during the hospital encounter of 04/24/16  Blood culture (  routine x 2)     Status: None (Preliminary result)   Collection Time: 04/24/16 11:38 AM  Result Value Ref Range Status   Specimen Description BLOOD RIGHT ASSIST CONTROL  Final   Special Requests   Final    BOTTLES DRAWN AEROBIC AND ANAEROBIC  AERO 20CC ANA [place]   Culture NO GROWTH 3 DAYS  Final   Report Status PENDING  Incomplete  CSF culture     Status: None (Preliminary result)   Collection Time: 04/24/16 11:38 AM  Result Value Ref Range Status   Specimen Description CSF  Final   Special Requests Normal  Final   Gram Stain RARE WBC SEEN NO ORGANISMS SEEN   Final   Culture   Final    NO GROWTH 3 DAYS Performed at [facility]    Report Status PENDING  Incomplete  Culture, blood (routine x 2)     Status: None (Preliminary result)   Collection Time:  04/24/16 11:38 AM  Result Value Ref Range Status   Specimen Description BLOOD RIGHT ASSIST CONTROL  Final   Special Requests   Final    BOTTLES DRAWN AEROBIC AND ANAEROBIC  AERO 10CC ANA [place]   Culture NO GROWTH 3 DAYS  Final   Report Status PENDING  Incomplete  MRSA PCR Screening     Status: None   Collection Time: 04/24/16  3:20 PM  Result Value Ref Range Status   MRSA by PCR NEGATIVE NEGATIVE Final    Comment:        The GeneXpert MRSA Assay (FDA approved for NASAL specimens only), is one component of a comprehensive MRSA colonization surveillance program. It is not intended to diagnose MRSA infection nor to guide or monitor treatment for MRSA infections.   Urine culture     Status: None   Collection Time: 04/24/16  4:31 PM  Result Value Ref Range Status   Specimen Description URINE, CLEAN CATCH  Final   Special Requests Normal  Final   Culture NO GROWTH Performed at [facility]   Final   Report Status 04/25/2016 FINAL  Final  Culture, respiratory (NON-Expectorated)     Status: None   Collection Time: 04/24/16  4:31 PM  Result Value Ref Range Status   Specimen Description TRACHEAL ASPIRATE  Final   Special Requests NONE  Final   Gram Stain   Final    ABUNDANT WBC PRESENT,BOTH PMN AND MONONUCLEAR FEW GRAM POSITIVE COCCI IN PAIRS RARE GRAM VARIABLE ROD    Culture   Final    Consistent with normal respiratory flora. Performed at [facility]    Report Status 04/27/2016 FINAL  Final    Coagulation Studies: No results for input(s): LABPROT, INR in the last 72 hours.  Imaging: Mr Jeri Cos [id number] Contrast  04/25/2016  ADDENDUM REPORT: 04/25/2016 15:19 ADDENDUM: These results were called by telephone at the time of interpretation on 04/25/2016 at 3:19 pm to Dr. Benjie Karvonen , who verbally acknowledged these results. Electronically Signed   By: Franchot Gallo M.D.   On: 04/25/2016 15:19  04/25/2016  CLINICAL DATA:  Seizure.  History of fever in the emergency room  EXAM: MRI HEAD WITHOUT AND WITH CONTRAST TECHNIQUE: Multiplanar, multiecho pulse sequences of the brain and surrounding structures were obtained without and with intravenous contrast. CONTRAST:  66mL MULTIHANCE GADOBENATE DIMEGLUMINE 529 MG/ML IV SOLN COMPARISON:  CT head 04/24/2016 FINDINGS: Abnormal T2 signal in the left hippocampus and medial temporal lobe extending into the insular cortex. There  is also mild hyperintensity in the left cingulate gyrus. These areas show hyperintense signal on diffusion-weighted imaging without definite restricted diffusion. No associated hemorrhage. Mild enhancement in the left insula and left hippocampus postcontrast administration. No other areas of abnormal signal.  Ventricle size normal. Negative for hemorrhage or mass lesion. Pituitary normal in size.  Normal orbit. Mucous retention cysts in the maxillary sinus bilaterally. Mucosal edema in the paranasal sinuses. IMPRESSION: Diffuse signal abnormality in the left limbic system including the hippocampus and insula and left cingulate gyrus. There is mild enhancement postcontrast. Differential includes herpes encephalitis, especially given fever. 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 Electronically Signed: By: Franchot Gallo M.D. On: 04/25/2016 15:04   US Venous Img Upper Uni Left  04/26/2016  CLINICAL DATA:  70 year old female with a history of swelling in the left arm EXAM: LEFT UPPER EXTREMITY VENOUS DOPPLER ULTRASOUND TECHNIQUE: Gray-scale sonography with graded compression, as well as color Doppler and duplex ultrasound were performed to evaluate the upper extremity deep venous system from the level of the subclavian vein and including the jugular, axillary, basilic, radial, ulnar and upper cephalic vein. Spectral Doppler was utilized to evaluate flow at rest and with distal augmentation maneuvers. COMPARISON:  None. FINDINGS: Contralateral Subclavian Vein: Respiratory phasicity is normal  and symmetric with the symptomatic side. No evidence of thrombus. Normal compressibility. Internal Jugular Vein: No evidence of thrombus. Normal compressibility, respiratory phasicity and response to augmentation. Subclavian Vein: No evidence of thrombus. Normal compressibility, respiratory phasicity and response to augmentation. Axillary Vein: No evidence of thrombus. Normal compressibility, respiratory phasicity and response to augmentation. Cephalic Vein: No evidence of thrombus. Normal compressibility, respiratory phasicity and response to augmentation. Basilic Vein: No evidence of thrombus. Normal compressibility, respiratory phasicity and response to augmentation. Brachial Veins: No evidence of thrombus. Normal compressibility, respiratory phasicity and response to augmentation. Radial Veins: No evidence of thrombus. Normal compressibility, respiratory phasicity and response to augmentation. Ulnar Veins: No evidence of thrombus. Normal compressibility, respiratory phasicity and response to augmentation. Other Findings: Superficial venous thrombus at the left wrist associated with IV puncture site. Edema of the forearm. IMPRESSION: Sonographic survey of the left upper extremity negative for DVT. Positive for superficial vein thrombus associated with the left wrist IV puncture site. Edema of the left upper extremity. Signed, Dulcy Fanny. Earleen Newport, DO [facility] Electronically Signed   By: Corrie Mckusick D.O.   On: 04/26/2016 18:30   [place] Extrem Up Left Ltd  04/26/2016  CLINICAL DATA:  70 year old female with a history of left arm swelling EXAM: ULTRASOUND LEFT UPPER EXTREMITY LIMITED TECHNIQUE: Ultrasound examination of the upper extremity soft tissues was performed in the area of clinical concern. COMPARISON:  None FINDINGS: Targeted grayscale and color duplex of the left upper extremity. No focal fluid collection. Extensive edema of the left upper  extremity. Contemporaneously duplex demonstrates of thrombus of superficial veins of the left wrist associated with IV puncture site. IMPRESSION: Edema of the distal left upper extremity associated with thrombophlebitis. Signed, Dulcy Fanny. Earleen Newport, DO [facility] Electronically Signed   By: Corrie Mckusick D.O.   On: 04/26/2016 18:35   Dg Chest Port 1 View  04/26/2016  CLINICAL DATA:  Respiratory failure. EXAM: PORTABLE CHEST 1 VIEW COMPARISON:  04/25/2016 FINDINGS: Endotracheal tube remains present with the tip approximately 2 cm above the carina. Left jugular central line tip shows stable positioning in the upper SVC. Nasogastric tube extends into the stomach.  Temporary pacing pad has been removed. Lungs show bibasilar atelectasis, slightly more prominent compared to the prior study and potentially a small left pleural effusion. No overt edema, airspace consolidation or pneumothorax identified. The heart size and mediastinal contours are normal. IMPRESSION: Slight increased prominence of bibasilar atelectasis. Probable small left pleural effusion. Electronically Signed   By: Aletta Edouard M.D.   On: 04/26/2016 10:40    Medications:  I have reviewed the patient's current medications. Scheduled: . acyclovir  800 mg Intravenous Q8H  . antiseptic oral rinse  7 mL Mouth Rinse q12n4p  . cefTRIAXone (ROCEPHIN)  IV  2 g Intravenous Q12H  . chlorhexidine  15 mL Mouth Rinse BID  . enoxaparin (LOVENOX) injection  40 mg Subcutaneous Q24H  . famotidine  20 mg Oral BID  . fentaNYL (SUBLIMAZE) injection  50 mcg Intravenous Once  . insulin aspart  2-6 Units Subcutaneous Q4H  . levETIRAcetam  500 mg Intravenous Q12H  . levothyroxine  100 mcg Oral QAC breakfast  . sodium chloride flush  3 mL Intravenous Q12H  . vancomycin  750 mg Intravenous Q12H    Assessment/Plan:   S/p extubation, doesn't follow commands. HSV pending. HIV negative.  Afebrile   Con't Keppra Will follow.

## 2016-04-28 DIAGNOSIS — G9349 Other encephalopathy: Secondary | ICD-10-CM

## 2016-04-28 LAB — CSF CULTURE W GRAM STAIN: Special Requests: NORMAL

## 2016-04-28 LAB — HERPES SIMPLEX VIRUS(HSV) DNA BY PCR: HSV 1 DNA: UNDETERMINED

## 2016-04-28 LAB — GLUCOSE, CAPILLARY
GLUCOSE-CAPILLARY: 131 mg/dL — AB (ref 65–99)
GLUCOSE-CAPILLARY: 152 mg/dL — AB (ref 65–99)
GLUCOSE-CAPILLARY: 134 mg/dL — AB (ref 65–99)
Glucose-Capillary: 116 mg/dL — ABNORMAL HIGH (ref 65–99)
Glucose-Capillary: 126 mg/dL — ABNORMAL HIGH (ref 65–99)
Glucose-Capillary: 148 mg/dL — ABNORMAL HIGH (ref 65–99)
Glucose-Capillary: 220 mg/dL — ABNORMAL HIGH (ref 65–99)

## 2016-04-28 LAB — CSF CULTURE: CULTURE: NO GROWTH

## 2016-04-28 MED ORDER — INSULIN ASPART 100 UNIT/ML ~~LOC~~ SOLN: 2.0000 [IU] | Freq: Three times a day (TID) | SUBCUTANEOUS | Status: DC

## 2016-04-28 MED ORDER — INSULIN ASPART 100 UNIT/ML ~~LOC~~ SOLN
0.0000 [IU] | Freq: Every day | SUBCUTANEOUS | Status: DC
  Administered 2016-04-29: 2 [IU] via SUBCUTANEOUS
  Administered 2016-04-30: 21:00:00 4 [IU] via SUBCUTANEOUS
  Administered 2016-05-01: 3 [IU] via SUBCUTANEOUS
  Filled 2016-04-28: qty 3

## 2016-04-28 MED ORDER — INSULIN ASPART 100 UNIT/ML ~~LOC~~ SOLN
0.0000 [IU] | Freq: Three times a day (TID) | SUBCUTANEOUS | Status: DC
  Administered 2016-04-29: 5 [IU] via SUBCUTANEOUS
  Administered 2016-04-29 (×2): 3 [IU] via SUBCUTANEOUS
  Administered 2016-04-30: 4 [IU] via SUBCUTANEOUS
  Administered 2016-04-30 (×2): 8 [IU] via SUBCUTANEOUS
  Administered 2016-05-01: 5 [IU] via SUBCUTANEOUS
  Administered 2016-05-01: 8 [IU] via SUBCUTANEOUS
  Administered 2016-05-01 – 2016-05-02 (×2): 5 [IU] via SUBCUTANEOUS
  Filled 2016-04-28: qty 8
  Filled 2016-04-28: qty 2
  Filled 2016-04-28: qty 5
  Filled 2016-04-28: qty 8
  Filled 2016-04-28: qty 5
  Filled 2016-04-28: qty 4
  Filled 2016-04-28: qty 8
  Filled 2016-04-28: qty 3
  Filled 2016-04-28: qty 5
  Filled 2016-04-28: qty 3
  Filled 2016-04-28: qty 5
  Filled 2016-04-28: qty 4

## 2016-04-28 MED ORDER — PNEUMOCOCCAL VAC POLYVALENT 25 MCG/0.5ML IJ INJ: 0.5000 mL | INJECTION | INTRAMUSCULAR | Status: DC | PRN

## 2016-04-28 NOTE — Progress Notes (Signed)
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    ASSESSMENT / PLAN:  NEUROLOGIC  Seizures ?Encephalitis/ meningitis-MRI consistent with encephalitis; exact etiology unclear; final CSF cultures pending Altered mental status P:  RASS goal: 0 Neurology consult appreciated Vanc/.ceftriaxone/ acyclovir-- will continue abx per ID recs.  Continue Kepra  6/22 LP performed, CSF concerning for elevated protein  PULMONARY A: Acute hypoxemic/hypercarbic respiratory failure related to seizures s/p extubation 06/24  P:  Supplemental O2 prn Chest PT, flutter valve and IS WA Nasotracheal suctioning prn Monitor respiratory status closely as patient is at increased risk for re-intubation  CARDIOVASCULAR A:  No active issues P:  Continuous ICU monitoring Keep MAP goals>65  RENAL A:  Acute kidney Injury Hyponatremia hypokalemia UTI P:  Replace electrolytes per ICU protocol Follow chemistry  GASTROINTESTINAL A:  No active issues P:  Continue Gi prophylaxis.   HEMATOLOGIC A:  No active issues P:  scds lovenox for DVT prophylaxis Transfuse if Hgb<7  INFECTIOUS A:  Leucocytosis Meningitis P:  Follow cultures Continue vancomycin/ ceftriaxone and acyclovir  CULTURES: 6/22 blood culture>> 6/22 urine culture>> No growth to date 04/24/16: WBCs, gram-positive cocci in pairs MRSA screen negative Sputum culture 04/24/2016-WBCs, GPC in pairs CSF culture 04/24/2016: NGTD HSV titer: pending HIV: Non reactive  STUDIES:  6/22 LP>>  ANTIBIOTICS: 6/22 vancomycin>> 6/22 ceftriaxone>> 6/22 acyclovir>> 6/22 ampicillin>>04/25/2016  LINES/TUBES: 6/22 ET tube>>04/26/16 6/22 LIJ  CVL>>>  ENDOCRINE A:  Diabetese Melitus  Hypothyroidism P:  Blood sugar checks SSI coverage Continue Synthroid  Disposition and family update: Patient's husband updated at bedside 6/26 am.   Best Practice: Code Status: Full. Diet: NPO; Awaiting swallow eval GI prophylaxis: PPI. VTE prophylaxis: SCD's / heparin.      SIGNIFICANT EVENTS: 6/22>>Patient presented to [facility] with Altered mental status, febrile and ended up having seizures requiring intubation and now on mechanically ventilated ---------------------------------------   ----------------------------------------   Name: Cathy Harrington MRN: [id number] DOB: 1946/07/01    ADMISSION DATE:  04/24/2016   SUBJECTIVE:   Pt currently confused, and minimally verbal,  can not provide history or review of systems.    VITAL SIGNS: Temp:  [98 F (36.7 C)-98.9 F (37.2 C)] 98 F (36.7 C) (06/26 0800) Pulse Rate:  [67-89] 70 (06/26 0800) Resp:  [18-30] 28 (06/26 0800) BP: (104-162)/(53-83) 104/53 mmHg (06/26 0800) SpO2:  [93 %-100 %] 93 % (06/26 0800) Weight:  [174 lb 13.2 oz (79.3 kg)] 174 lb 13.2 oz (79.3 kg) (06/26 0500) HEMODYNAMICS:   VENTILATOR SETTINGS:   INTAKE / OUTPUT:  Intake/Output Summary (Last 24 hours) at 04/28/16 1047 Last data filed at 04/28/16 0802  Gross per 24 hour  Intake   2278 ml  Output   3350 ml  Net  -1072 ml    PHYSICAL EXAMINATION: Physical Examination:   VS: BP 104/53 mmHg  Pulse 70  Temp(Src) 98 F (36.7 C) (Oral)  Resp 28  Ht 5\' 3"  (1.6 m)  Wt 174 lb 13.2 oz (79.3 kg)  BMI 30.98 kg/m2  SpO2 93%  General Appearance: No distress  Neuro:without focal findings, mental status reduced, arousable but confused. Has trouble following commands.  HEENT: PERRLA, EOM intact. Pulmonary: normal breath sounds   CardiovascularNormal S1,S2.  No m/r/g.   Abdomen:  Benign, Soft, non-tender. Renal:  No costovertebral tenderness  GU:  Not performed at this time. Endocrine: No  evident thyromegaly. Skin:   warm, no rashes, no ecchymosis  Extremities: normal, no cyanosis, clubbing.   LABS:   LABORATORY PANEL:   CBC  Recent Labs Lab 04/27/16 0505  WBC 6.3  HGB 11.0*  HCT 32.1*  PLT 143*    Chemistries   Recent Labs Lab 04/24/16 1138  04/26/16 1724 04/27/16 0505  NA 130*  < >  --  136  K 3.2*  < >  --  3.6  CL 91*  < >  --  102  CO2 18*  < >  --  27  GLUCOSE 141*  < >  --  111*  BUN 15  < >  --  7  CREATININE 1.24*  < >  --  0.64  CALCIUM 8.4*  < >  --  7.9*  MG 1.8  < > 1.7  --   PHOS 1.7*  < > 2.4*  --   AST 39  --   --   --   ALT 20  --   --   --   ALKPHOS 49  --   --   --   BILITOT 0.5  --   --   --   < > = values in this interval not displayed.   Recent Labs Lab 04/27/16 1742 04/27/16 2028 04/28/16 0001 04/28/16 0405 04/28/16 0441 04/28/16 0729  GLUCAP 104* 118* 116* 148* 126* 131*    Recent Labs Lab 04/24/16 1230 04/25/16 0451  PHART 7.42 7.47*  PCO2ART 35 31*  PO2ART 144* 96    Recent Labs Lab 04/24/16 0750 04/24/16 1138  AST 29 39  ALT 19 20  ALKPHOS 47 49  BILITOT 0.7 0.5  ALBUMIN 4.2 4.3    Cardiac Enzymes  Recent Labs Lab 04/24/16 0750  TROPONINI <0.03    RADIOLOGY:  US Venous Img Upper Uni Left  04/26/2016  CLINICAL DATA:  70 year old female with a history of swelling in the left arm EXAM: LEFT UPPER EXTREMITY VENOUS DOPPLER ULTRASOUND TECHNIQUE: Gray-scale sonography with graded compression, as well as color Doppler and duplex ultrasound were performed to evaluate the upper extremity deep venous system from the level of the subclavian vein and including the jugular, axillary, basilic, radial, ulnar and upper cephalic vein. Spectral Doppler was utilized to evaluate flow at rest and with distal augmentation maneuvers. COMPARISON:  None. FINDINGS: Contralateral Subclavian Vein: Respiratory phasicity is normal and symmetric with the symptomatic side. No evidence of thrombus. Normal compressibility.  Internal Jugular Vein: No evidence of thrombus. Normal compressibility, respiratory phasicity and response to augmentation. Subclavian Vein: No evidence of thrombus. Normal compressibility, respiratory phasicity and response to augmentation. Axillary Vein: No evidence of thrombus. Normal compressibility, respiratory phasicity and response to augmentation. Cephalic Vein: No evidence of thrombus. Normal compressibility, respiratory phasicity and response to augmentation. Basilic Vein: No evidence of thrombus. Normal compressibility, respiratory phasicity and response to augmentation. Brachial Veins: No evidence of thrombus. Normal compressibility, respiratory phasicity and response to augmentation. Radial Veins: No evidence of thrombus. Normal compressibility, respiratory phasicity and response to augmentation. Ulnar Veins: No evidence of thrombus. Normal compressibility, respiratory phasicity and response to augmentation. Other Findings: Superficial venous thrombus at the left wrist associated with IV puncture site. Edema of the forearm. IMPRESSION: Sonographic survey of the left upper extremity negative for DVT. Positive for superficial vein thrombus associated with the left wrist IV puncture site. Edema  of the left upper extremity. Signed, Dulcy Fanny. Earleen Newport, DO [facility] Electronically Signed   By: Corrie Mckusick D.O.   On: 04/26/2016 18:30   [place] Extrem Up Left Ltd  04/26/2016  CLINICAL DATA:  70 year old female with a history of left arm swelling EXAM: ULTRASOUND LEFT UPPER EXTREMITY LIMITED TECHNIQUE: Ultrasound examination of the upper extremity soft tissues was performed in the area of clinical concern. COMPARISON:  None FINDINGS: Targeted grayscale and color duplex of the left upper extremity. No focal fluid collection. Extensive edema of the left upper extremity. Contemporaneously duplex demonstrates of thrombus of superficial veins of the left wrist  associated with IV puncture site. IMPRESSION: Edema of the distal left upper extremity associated with thrombophlebitis. Signed, Dulcy Fanny. Earleen Newport, DO [facility] Radiology Electronically Signed   By: Corrie Mckusick D.O.   On: 04/26/2016 18:35       --Marda Stalker, MD.  ICU Pager: [phone] [place] [facility] Office Number: [phone]  Patricia Pesa, M.D.  Vilinda Boehringer, M.D.  Merton Border, M.D  04/28/2016   Critical Care Attestation.  I have personally obtained a history, examined the patient, evaluated laboratory and imaging results, formulated the assessment and plan and placed orders. The Patient requires high complexity decision making for assessment and support, frequent evaluation and titration of therapies, application of advanced monitoring technologies and extensive interpretation of multiple databases. The patient has critical illness that could lead imminently to failure of 1 or more organ systems and requires the highest level of physician preparedness to intervene.  Critical Care Time devoted to patient care services described in this note is 35  minutes and is exclusive of time spent in procedures.

## 2016-04-28 NOTE — Progress Notes (Signed)
[facility] Consultation    Name: Cathy Harrington MRN: [id number] DOB: [birth year]    ADMISSION DATE:  04/24/2016  CHIEF COMPLAINT:   Acute resp failure from acute seizures   HISTORY OF PRESENT ILLNESS  Remains intubated, sedated Will attempt SAT/SBT today Assess neuro status  LP resutlrs likely c/wpossible viral meningitis aseptic   Subjective: No acute issues overnight. Doing well post-extubation on [place]. Mild improvement in confusion.   Review of Systems  Unable to perform ROS: critical illness    VITAL SIGNS    Temp:  [98.2 F (36.8 C)-98.9 F (37.2 C)] 98.2 F (36.8 C) (06/26 0200) Pulse Rate:  [67-89] 81 (06/26 0700) Resp:  [18-30] 29 (06/26 0700) BP: (110-162)/(54-83) 118/65 mmHg (06/26 0700) SpO2:  [93 %-100 %] 95 % (06/26 0700) Weight:  [174 lb 13.2 oz (79.3 kg)] 174 lb 13.2 oz (79.3 kg) (06/26 0500) HEMODYNAMICS:   VENTILATOR SETTINGS:   INTAKE / OUTPUT:  Intake/Output Summary (Last 24 hours) at 04/28/16 0757 Last data filed at 04/28/16 0500  Gross per 24 hour  Intake   1312 ml  Output   3350 ml  Net  -2038 ml     PHYSICAL EXAM   Physical Exam  Constitutional: No distress.  Fully awake  HENT:  Head: Normocephalic and atraumatic.  Eyes: Pupils are equal, round, and reactive to light. No scleral icterus.  Neck: Normal range of motion. Neck supple.  Cardiovascular: Normal rate and regular rhythm.   No murmur heard. Pulmonary/Chest: She has no wheezes. She has rales.  Bilateral airflow; improved rhonchi in upper lung fields  Abdominal: Soft. She exhibits no distension. There is no tenderness.  Musculoskeletal: She exhibits no edema.  Neurological: She is alert. She displays normal reflexes. Coordination normal.  Oriented to self  Skin: Skin is warm.    LABS   LABS:  CBC  Recent Labs Lab 04/24/16 1138 04/25/16 0410 04/27/16 0505  WBC 20.2* 11.0 6.3  HGB 13.3 10.8* 11.0*  HCT 38.6 31.7* 32.1*  PLT 269 203 143*     Coag's No results for input(s): APTT, INR in the last 168 hours. BMET  Recent Labs Lab 04/25/16 0410 04/26/16 0500 04/27/16 0505  NA 132* 135 136  K 3.5 3.2* 3.6  CL 104 104 102  CO2 23 25 27   BUN 10 10 7   CREATININE 0.80 0.62 0.64  GLUCOSE 131* 173* 111*   Electrolytes  Recent Labs Lab 04/25/16 0410 04/25/16 1415 04/26/16 0500 04/26/16 1724 04/27/16 0505  CALCIUM 6.7*  --  7.4*  --  7.9*  MG 2.2 2.1 1.9 1.7  --   PHOS 2.5 2.2* 2.3* 2.4*  --    Sepsis Markers  Recent Labs Lab 04/24/16 1405 04/25/16 1100 04/25/16 1610  LATICACIDVEN 1.4 0.9 0.7   ABG  Recent Labs Lab 04/24/16 1230 04/25/16 0451  PHART 7.42 7.47*  PCO2ART 35 31*  PO2ART 144* 96   Liver Enzymes  Recent Labs Lab 04/24/16 0750 04/24/16 1138  AST 29 39  ALT 19 20  ALKPHOS 47 49  BILITOT 0.7 0.5  ALBUMIN 4.2 4.3   Cardiac Enzymes  Recent Labs Lab 04/24/16 0750  TROPONINI <0.03   Glucose  Recent Labs Lab 04/27/16 1742 04/27/16 2028 04/28/16 0001 04/28/16 0405 04/28/16 0441 04/28/16 0729  GLUCAP 104* 118* 116* 148* 126* 131*     Recent Results (from the past 240 hour(s))  Blood culture (routine x 2)     Status: None (Preliminary result)  Collection Time: 04/24/16 11:38 AM  Result Value Ref Range Status   Specimen Description BLOOD RIGHT ASSIST CONTROL  Final   Special Requests   Final    BOTTLES DRAWN AEROBIC AND ANAEROBIC  AERO 20CC ANA [place]   Culture NO GROWTH 3 DAYS  Final   Report Status PENDING  Incomplete  CSF culture     Status: None (Preliminary result)   Collection Time: 04/24/16 11:38 AM  Result Value Ref Range Status   Specimen Description CSF  Final   Special Requests Normal  Final   Gram Stain RARE WBC SEEN NO ORGANISMS SEEN   Final   Culture   Final    NO GROWTH 3 DAYS Performed at [facility]    Report Status PENDING  Incomplete  Culture, blood (routine x 2)     Status: None (Preliminary result)   Collection Time: 04/24/16 11:38  AM  Result Value Ref Range Status   Specimen Description BLOOD RIGHT ASSIST CONTROL  Final   Special Requests   Final    BOTTLES DRAWN AEROBIC AND ANAEROBIC  AERO 10CC ANA [place]   Culture NO GROWTH 3 DAYS  Final   Report Status PENDING  Incomplete  MRSA PCR Screening     Status: None   Collection Time: 04/24/16  3:20 PM  Result Value Ref Range Status   MRSA by PCR NEGATIVE NEGATIVE Final    Comment:        The GeneXpert MRSA Assay (FDA approved for NASAL specimens only), is one component of a comprehensive MRSA colonization surveillance program. It is not intended to diagnose MRSA infection nor to guide or monitor treatment for MRSA infections.   Urine culture     Status: None   Collection Time: 04/24/16  4:31 PM  Result Value Ref Range Status   Specimen Description URINE, CLEAN CATCH  Final   Special Requests Normal  Final   Culture NO GROWTH Performed at [facility]   Final   Report Status 04/25/2016 FINAL  Final  Culture, respiratory (NON-Expectorated)     Status: None   Collection Time: 04/24/16  4:31 PM  Result Value Ref Range Status   Specimen Description TRACHEAL ASPIRATE  Final   Special Requests NONE  Final   Gram Stain   Final    ABUNDANT WBC PRESENT,BOTH PMN AND MONONUCLEAR FEW GRAM POSITIVE COCCI IN PAIRS RARE GRAM VARIABLE ROD    Culture   Final    Consistent with normal respiratory flora. Performed at [facility]    Report Status 04/27/2016 FINAL  Final     Current facility-administered medications:  .  0.9 %  sodium chloride infusion, 250 mL, Intravenous, PRN, Flora Lipps, MD .  0.9 %  sodium chloride infusion, 250 mL, Intravenous, PRN, Flora Lipps, MD .  0.9 %  sodium chloride infusion, , Intravenous, Continuous, Vishal Mungal, MD, Last Rate: 50 mL/hr at 04/27/16 1744 .  acetaminophen (TYLENOL) tablet 650 mg, 650 mg, Oral, Q4H PRN, Flora Lipps, MD, 650 mg at 04/25/16 0828 .  acyclovir (ZOVIRAX) 800 mg in dextrose 5 % 150 mL IVPB,  800 mg, Intravenous, Q8H, Flora Lipps, MD, 800 mg at 04/28/16 0600 .  antiseptic oral rinse (CPC / CETYLPYRIDINIUM CHLORIDE 0.05%) solution 7 mL, 7 mL, Mouth Rinse, q12n4p, Vishal Mungal, MD, 7 mL at 04/27/16 1700 .  cefTRIAXone (ROCEPHIN) 2 g in dextrose 5 % 50 mL IVPB, 2 g, Intravenous, Q12H, Leonel Ramsay, MD, 2 g at 04/28/16 0500 .  chlorhexidine (PERIDEX) 0.12 % solution 15 mL, 15 mL, Mouth Rinse, BID, Vishal Mungal, MD, 15 mL at 04/27/16 2200 .  enoxaparin (LOVENOX) injection 40 mg, 40 mg, Subcutaneous, Q24H, Flora Lipps, MD, 40 mg at 04/27/16 1320 .  famotidine (PEPCID) tablet 20 mg, 20 mg, Oral, BID, Flora Lipps, MD, 20 mg at 04/26/16 1027 .  fentaNYL (SUBLIMAZE) injection 50 mcg, 50 mcg, Intravenous, Once, Bincy S Varughese, NP, 50 mcg at 04/24/16 1514 .  insulin aspart (novoLOG) injection 2-6 Units, 2-6 Units, Subcutaneous, Q4H, Flora Lipps, MD, 2 Units at 04/28/16 0400 .  levETIRAcetam (KEPPRA) 500 mg in sodium chloride 0.9 % 100 mL IVPB, 500 mg, Intravenous, Q12H, Bincy S Varughese, NP, 500 mg at 04/28/16 0015 .  levothyroxine (SYNTHROID, LEVOTHROID) tablet 100 mcg, 100 mcg, Oral, QAC breakfast, Bincy S Varughese, NP, 100 mcg at 04/26/16 0540 .  ondansetron (ZOFRAN) injection 4 mg, 4 mg, Intravenous, Q6H PRN, Flora Lipps, MD .  pneumococcal 23 valent vaccine (PNU-IMMUNE) injection 0.5 mL, 0.5 mL, Intramuscular, Tomorrow-1000, Vishal Mungal, MD .  sodium chloride flush (NS) 0.9 % injection 3 mL, 3 mL, Intravenous, Q12H, Flora Lipps, MD, 3 mL at 04/27/16 2200 .  sodium chloride flush (NS) 0.9 % injection 3 mL, 3 mL, Intravenous, PRN, Flora Lipps, MD .  vancomycin (VANCOCIN) IVPB 1000 mg/200 mL premix, 1,000 mg, Intravenous, Q8H, Vira Blanco, RPH, 1,000 mg at 04/28/16 0100  IMAGING    No results found.   Indwelling Urinary Catheter continued, requirement due to   Reason to continue Indwelling Urinary Catheter for strict Intake/Output monitoring for hemodynamic instability    Central Line continued, requirement due to   Reason to continue [employer] Monitoring of central venous pressure or other hemodynamic parameters   Ventilator continued, requirement due to, resp failure    Ventilator Sedation RASS 0 to -2   STUDIES:  6/22 LP>>  CULTURES: 6/22 blood culture>> 6/22 urine culture>> No growth to date 04/24/16: WBCs, gram-positive cocci in pairs MRSA screen negative Sputum culture 04/24/2016-WBCs, GPC in pairs CSF culture 04/24/2016: NGTD HSV titer: pending HIV: Non reactive  ANTIBIOTICS: 6/22 vancomycin>> 6/22 ceftriaxone>> 6/22 acyclovir>> 6/22 ampicillin>>04/25/2016  SIGNIFICANT EVENTS: 6/22>>Patient presented to [facility] with Altered mental status, febrile and ended up having seizures requiring intubation and now on mechanically ventilated  LINES/TUBES: 6/22 ET tube>>04/26/16 6/22 [facility] CVL>>>   DISCUSSION: 70 yo female with Hx of DM-Type2, hypothyroidism , UTI now presenting with Altered mental Status, Hyperthermia and one episode of seizures in the ED requiring emergent intubation.MAJOR EVENTS/TEST RESULTS:  ASSESSMENT/PLAN   70 yo female with Hx of DM-Type2, hypothyroidism , UTI now presenting with Altered mental Status, Hyperthermia and one episode of seizures in the ED requiring emergent intubation.   ASSESSMENT / PLAN:  NEUROLOGIC  Seizures ?Encephalitis/ meningitis-MRI consistent with encephalitis; exact etiology unclear; final CSF cultures pending Altered mental status P:  RASS goal: 0 Neurology consult appreciated Vanc/.ceftriaxone/ acyclovir Continue Kepra  6/22 LP performed, CSF concerning for elevated protein  PULMONARY A: Acute hypoxemic/hypercarbic respiratory failure related to seizures s/p extubation 06/24 ?Aspiration P:  Supplemental O2 prn Chest PT, flutter valve and IS WA Nasotracheal suctioning prn Monitor respiratory status closely as patient is at increased risk for  re-intubation  CARDIOVASCULAR A:  No active issues P:  Continuous ICU monitoring Keep MAP goals>65  RENAL A:  Acute kidney Injury Hyponatremia hypokalemia UTI P:  Replace electrolytes per ICU protocol Follow chemistry  GASTROINTESTINAL A:  No active issues P:  Pepcid for GI  prohylaxis  HEMATOLOGIC A:  No active issues P:  scds lovenox for DVT prophylaxis Transfuse if Hgb<7  INFECTIOUS A:  Leucocytosis Meningitis P:  Follow cultures Continue vancomycin/ ceftriaxone and acyclovir  ENDOCRINE A:  Diabetese Melitus  Hypothyroidism P:  Blood sugar checks SSI coverage Continue Synthroid  Disposition and family update: Patient's husband updated at bedside 6/25 evening rounds   Best Practice: Code Status:  Full. Diet: NPO;  Awaiting swallow eval GI prophylaxis:  PPI. VTE prophylaxis:  SCD's / heparin.  CC time 35 minutes  Neven Fina S. [facility] ANP-BC Pulmonary and Critical Care Medicine [facility] Pager [phone] or [phone]

## 2016-04-28 NOTE — Progress Notes (Signed)
    [facility] for Infectious Disease   Reason for visit: Follow up on encephalitis; E-follow up  Interval History: HSV PCR not sufficient quantity to run test.    Filed Vitals:   04/28/16 1100 04/28/16 1200  BP: 121/55 130/60  Pulse: 74 66  Temp:  98 F (36.7 C)  Resp: 27 27     Impression: viral encephalopathy, HSV is high in the differential.  Not consistent with bacterial etiology.   Plan: 1.  Treat for 14 days with IV acyclovir every 8 hours 2.  Stop other antibiotics, not consistent with bacterial origin, culture negative. 3.  No indication for airborne isolation for encephalitis  Scharlene Gloss, MD

## 2016-04-28 NOTE — Progress Notes (Signed)
[facility] for electrolyte management   No Known Allergies  Patient Measurements: Height: 5\' 3"  (160 cm) Weight: 174 lb 13.2 oz (79.3 kg) IBW/kg (Calculated) : 52.4   Vital Signs: Temp: 98 F (36.7 C) (06/26 0800) Temp Source: Oral (06/26 0800) BP: 104/53 mmHg (06/26 0800) Pulse Rate: 70 (06/26 0800) Intake/Output from previous day: 06/25 0701 - 06/26 0700 In: 1312 [I.V.:675; IV Piggyback:637] Out: 3350 [Urine:3350] Intake/Output from this shift: Total I/O In: 1116 [I.V.:500; IV Piggyback:616] Out: -   Labs:  Recent Labs  04/25/16 1415 04/26/16 0500 04/26/16 1724 04/27/16 0505  WBC  --   --   --  6.3  HGB  --   --   --  11.0*  HCT  --   --   --  32.1*  PLT  --   --   --  143*  CREATININE  --  0.62  --  0.64  MG 2.1 1.9 1.7  --   PHOS 2.2* 2.3* 2.4*  --    Estimated Creatinine Clearance: 66.2 mL/min (by C-G formula based on Cr of 0.64).   Assessment: Pharmacy consulted for electrolyte management for 70 yo female admitted with possible meningitis.   Electrolytes within normal limits.   Plan:  No supplementation warranted @ this time. Will recheck Electrolytes in am.  Jayma Volpi D 04/28/2016,11:37 AM

## 2016-04-28 NOTE — Progress Notes (Signed)
Subjective: Awake in bed being fed by husband.    Objective: Current vital signs: BP 130/60 mmHg  Pulse 66  Temp(Src) 98 F (36.7 C) (Oral)  Resp 27  Ht 5\' 3"  (1.6 m)  Wt 79.3 kg (174 lb 13.2 oz)  BMI 30.98 kg/m2  SpO2 96% Vital signs in last 24 hours: Temp:  [98 F (36.7 C)-98.5 F (36.9 C)] 98 F (36.7 C) (06/26 1200) Pulse Rate:  [65-89] 66 (06/26 1200) Resp:  [18-30] 27 (06/26 1200) BP: (104-162)/(53-83) 130/60 mmHg (06/26 1200) SpO2:  [93 %-99 %] 96 % (06/26 1200) Weight:  [79.3 kg (174 lb 13.2 oz)] 79.3 kg (174 lb 13.2 oz) (06/26 0500)  Intake/Output from previous day: 06/25 0701 - 06/26 0700 In: 1312 [I.V.:675; IV Piggyback:637] Out: 3350 [Urine:3350] Intake/Output this shift: Total I/O In: 1526 [I.V.:700; IV Piggyback:826] Out: -  Nutritional status: DIET DYS 2 Room service appropriate?: Yes; Fluid consistency:: Nectar Thick  Neurologic Exam: Mental Status: Alert.  Some comprehensible speech noted but not always appropriate for question being asked.  Follows commands if accompanied by nonverbal cues but unable to follow verbal commands only.  Cranial Nerves: II: Discs flat bilaterally; Pupils equal, round, reactive to light and accommodation III,IV, VI: ptosis not present, extra-ocular motions intact bilaterally V,VII: no facial asymmetry noted.  VIII: hearing normal bilaterally IX,X: gag reflex present XI: unable to test XII: unable to test Motor: Moves all extremities against gravity with no focal weakness noted.  Sensory: Appreciates tactile stimuli in all extremities   Lab Results: Basic Metabolic Panel:  Recent Labs Lab 04/24/16 0750 04/24/16 1138 04/25/16 0410 04/25/16 1415 04/26/16 0500 04/26/16 1724 04/27/16 0505  NA 126* 130* 132*  --  135  --  136  K 3.8 3.2* 3.5  --  3.2*  --  3.6  CL 92* 91* 104  --  104  --  102  CO2 23 18* 23  --  25  --  27  GLUCOSE 149* 141* 131*  --  173*  --  111*  BUN 17 15 10   --  10  --  7  CREATININE  0.94 1.24* 0.80  --  0.62  --  0.64  CALCIUM 8.6* 8.4* 6.7*  --  7.4*  --  7.9*  MG  --  1.8 2.2 2.1 1.9 1.7  --   PHOS  --  1.7* 2.5 2.2* 2.3* 2.4*  --     Liver Function Tests:  Recent Labs Lab 04/24/16 0750 04/24/16 1138  AST 29 39  ALT 19 20  ALKPHOS 47 49  BILITOT 0.7 0.5  PROT 7.2 7.5  ALBUMIN 4.2 4.3   No results for input(s): LIPASE, AMYLASE in the last 168 hours. No results for input(s): AMMONIA in the last 168 hours.  CBC:  Recent Labs Lab 04/24/16 0750 04/24/16 1138 04/25/16 0410 04/27/16 0505  WBC 11.6* 20.2* 11.0 6.3  NEUTROABS 8.4* 11.7*  --   --   HGB 13.5 13.3 10.8* 11.0*  HCT 38.7 38.6 31.7* 32.1*  MCV 82.1 85.3 82.8 84.6  PLT 226 269 203 143*    Cardiac Enzymes:  Recent Labs Lab 04/24/16 0750  TROPONINI <0.03    Lipid Panel: No results for input(s): CHOL, TRIG, HDL, CHOLHDL, VLDL, LDLCALC in the last 168 hours.  CBG:  Recent Labs Lab 04/28/16 0001 04/28/16 0405 04/28/16 0441 04/28/16 0729 04/28/16 1130  GLUCAP 116* 148* 126* 131* 152*    Microbiology: Results for orders placed or performed during the  hospital encounter of 04/24/16  Blood culture (routine x 2)     Status: None (Preliminary result)   Collection Time: 04/24/16 11:38 AM  Result Value Ref Range Status   Specimen Description BLOOD RIGHT ASSIST CONTROL  Final   Special Requests   Final    BOTTLES DRAWN AEROBIC AND ANAEROBIC  AERO 20CC ANA [place]   Culture NO GROWTH 4 DAYS  Final   Report Status PENDING  Incomplete  CSF culture     Status: None   Collection Time: 04/24/16 11:38 AM  Result Value Ref Range Status   Specimen Description CSF  Final   Special Requests Normal  Final   Gram Stain RARE WBC SEEN NO ORGANISMS SEEN   Final   Culture   Final    NO GROWTH 3 DAYS Performed at [facility]    Report Status 04/28/2016 FINAL  Final  Culture, blood (routine x 2)     Status: None (Preliminary result)   Collection Time: 04/24/16 11:38 AM  Result Value  Ref Range Status   Specimen Description BLOOD RIGHT ASSIST CONTROL  Final   Special Requests   Final    BOTTLES DRAWN AEROBIC AND ANAEROBIC  AERO 10CC ANA [place]   Culture NO GROWTH 4 DAYS  Final   Report Status PENDING  Incomplete  MRSA PCR Screening     Status: None   Collection Time: 04/24/16  3:20 PM  Result Value Ref Range Status   MRSA by PCR NEGATIVE NEGATIVE Final    Comment:        The GeneXpert MRSA Assay (FDA approved for NASAL specimens only), is one component of a comprehensive MRSA colonization surveillance program. It is not intended to diagnose MRSA infection nor to guide or monitor treatment for MRSA infections.   Urine culture     Status: None   Collection Time: 04/24/16  4:31 PM  Result Value Ref Range Status   Specimen Description URINE, CLEAN CATCH  Final   Special Requests Normal  Final   Culture NO GROWTH Performed at [facility]   Final   Report Status 04/25/2016 FINAL  Final  Culture, respiratory (NON-Expectorated)     Status: None   Collection Time: 04/24/16  4:31 PM  Result Value Ref Range Status   Specimen Description TRACHEAL ASPIRATE  Final   Special Requests NONE  Final   Gram Stain   Final    ABUNDANT WBC PRESENT,BOTH PMN AND MONONUCLEAR FEW GRAM POSITIVE COCCI IN PAIRS RARE GRAM VARIABLE ROD    Culture   Final    Consistent with normal respiratory flora. Performed at [facility]    Report Status 04/27/2016 FINAL  Final    Coagulation Studies: No results for input(s): LABPROT, INR in the last 72 hours.  Imaging: US Venous Img Upper Uni Left  04/26/2016  CLINICAL DATA:  70 year old female with a history of swelling in the left arm EXAM: LEFT UPPER EXTREMITY VENOUS DOPPLER ULTRASOUND TECHNIQUE: Gray-scale sonography with graded compression, as well as color Doppler and duplex ultrasound were performed to evaluate the upper extremity deep venous system from the level of the subclavian vein and including the jugular,  axillary, basilic, radial, ulnar and upper cephalic vein. Spectral Doppler was utilized to evaluate flow at rest and with distal augmentation maneuvers. COMPARISON:  None. FINDINGS: Contralateral Subclavian Vein: Respiratory phasicity is normal and symmetric with the symptomatic side. No evidence of thrombus. Normal compressibility. Internal Jugular Vein: No evidence of thrombus. Normal compressibility,  respiratory phasicity and response to augmentation. Subclavian Vein: No evidence of thrombus. Normal compressibility, respiratory phasicity and response to augmentation. Axillary Vein: No evidence of thrombus. Normal compressibility, respiratory phasicity and response to augmentation. Cephalic Vein: No evidence of thrombus. Normal compressibility, respiratory phasicity and response to augmentation. Basilic Vein: No evidence of thrombus. Normal compressibility, respiratory phasicity and response to augmentation. Brachial Veins: No evidence of thrombus. Normal compressibility, respiratory phasicity and response to augmentation. Radial Veins: No evidence of thrombus. Normal compressibility, respiratory phasicity and response to augmentation. Ulnar Veins: No evidence of thrombus. Normal compressibility, respiratory phasicity and response to augmentation. Other Findings: Superficial venous thrombus at the left wrist associated with IV puncture site. Edema of the forearm. IMPRESSION: Sonographic survey of the left upper extremity negative for DVT. Positive for superficial vein thrombus associated with the left wrist IV puncture site. Edema of the left upper extremity. Signed, Dulcy Fanny. Earleen Newport, DO [facility] Radiology Electronically Signed   By: Corrie Mckusick D.O.   On: 04/26/2016 18:30   [place] Extrem Up Left Ltd  04/26/2016  CLINICAL DATA:  69 year old female with a history of left arm swelling EXAM: ULTRASOUND LEFT UPPER EXTREMITY LIMITED TECHNIQUE: Ultrasound examination  of the upper extremity soft tissues was performed in the area of clinical concern. COMPARISON:  None FINDINGS: Targeted grayscale and color duplex of the left upper extremity. No focal fluid collection. Extensive edema of the left upper extremity. Contemporaneously duplex demonstrates of thrombus of superficial veins of the left wrist associated with IV puncture site. IMPRESSION: Edema of the distal left upper extremity associated with thrombophlebitis. Signed, Dulcy Fanny. Earleen Newport, DO [facility] Radiology Electronically Signed   By: Corrie Mckusick D.O.   On: 04/26/2016 18:35    Medications:  I have reviewed the patient's current medications. Scheduled: . acyclovir  800 mg Intravenous Q8H  . antiseptic oral rinse  7 mL Mouth Rinse q12n4p  . cefTRIAXone (ROCEPHIN)  IV  2 g Intravenous Q12H  . chlorhexidine  15 mL Mouth Rinse BID  . enoxaparin (LOVENOX) injection  40 mg Subcutaneous Q24H  . famotidine  20 mg Oral BID  . fentaNYL (SUBLIMAZE) injection  50 mcg Intravenous Once  . insulin aspart  2-6 Units Subcutaneous Q4H  . levETIRAcetam  500 mg Intravenous Q12H  . levothyroxine  100 mcg Oral QAC breakfast  . sodium chloride flush  3 mL Intravenous Q12H  . vancomycin  1,000 mg Intravenous Q8H    Assessment/Plan: Slowly improving.  Remains on Acyclovir.  HSV titer pending.   Recommendations: 1.  Continue Keppra at current dose 2.  Will continue to follow with you   LOS: 4 days   Alexis Goodell, MD Neurology [phone] 04/28/2016  1:46 PM

## 2016-04-28 NOTE — Progress Notes (Addendum)
Pharmacy Antibiotic Note  Cathy Harrington is a 70 y.o. female admitted on 04/24/2016 with meningitis.  Pharmacy has been consulted for acyclovir dosing for viral encephalopathy   Plan: Will continue Acyclovir 800mg  IV Q8hr. HSV titer pending. Per ID MD continue therapy for 14 days.    Height: 5\' 3"  (160 cm) Weight: 174 lb 13.2 oz (79.3 kg) IBW/kg (Calculated) : 52.4  Temp (24hrs), Avg:98.2 F (36.8 C), Min:98 F (36.7 C), Max:98.5 F (36.9 C)   Recent Labs Lab 04/24/16 0750 04/24/16 1138 04/24/16 1405 04/25/16 0410 04/25/16 1100 04/25/16 1610 04/26/16 0500 04/27/16 0505 04/27/16 1752  WBC 11.6* 20.2*  --  11.0  --   --   --  6.3  --   CREATININE 0.94 1.24*  --  0.80  --   --  0.62 0.64  --   LATICACIDVEN  --  1.4 1.4  --  0.9 0.7  --   --   --   VANCOTROUGH  --   --   --   --   --   --   --   --  7*    Estimated Creatinine Clearance: 66.2 mL/min (by C-G formula based on Cr of 0.64).    No Known Allergies  Antimicrobials this admission: Acyclovir 6/22 >>  Ampicillin 6/22 >> 6/23 Ceftriaxone 6/22 >> 6/23 Vancomycin 6/22 >> 6/26  Dose adjustments this admission: 6/23 Acyclovir transitioned to Q8hr dosing.   Microbiology results: 6/22 BCx: no growth x 1 day  6/22 UCx: no growth  6/22 Sputum: no growth < 24 hours 6/22 MRSA PCR: negative   6/22 CSF: no growth < 24 hours  Pharmacy will continue to monitor and adjust per consult.    Ember Gottwald D 04/28/2016 2:34 PM

## 2016-04-28 NOTE — Evaluation (Signed)
Clinical/Bedside Swallow Evaluation Patient Details  Name: Cathy Harrington MRN: [id number] Date of Birth: 08-31-1946  Today's Date: 04/28/2016 Time: SLP Start Time (ACUTE ONLY): 1140 SLP Stop Time (ACUTE ONLY): 1209 SLP Time Calculation (min) (ACUTE ONLY): 29 min  Past Medical History:  Past Medical History  Diagnosis Date  . Thyroid disease   . Diabetes mellitus without complication ([place])   . Hypothyroidism    Past Surgical History:  Past Surgical History  Procedure Laterality Date  . Back surgery     HPI:      Assessment / Plan / Recommendation Clinical Impression  pt prsents with a moderate oral pharyngeal dysphagia charactorzed by prolonged bolus formation with regular testures and lengthy mastication. pt was noted to be wfl for semi soft textures and able to masticate and swallow wfl. pt had throat clear x 2 with thin liquids via cup, however no overt ssx aspiration noted with nectar thick liquids. st recommends ndds2 with nectar thick diet, nursing and husband educated and verbally agreed.     Aspiration Risk  Moderate aspiration risk    Diet Recommendation Dysphagia 2 (Fine chop);Nectar-thick liquid   Liquid Administration via: Cup;No straw;Spoon Medication Administration: Crushed with puree Supervision: Patient able to self feed Compensations: Minimize environmental distractions;Slow rate;Small sips/bites;Follow solids with liquid Postural Changes: Seated upright at 90 degrees    Other  Recommendations Oral Care Recommendations: Oral care BID Other Recommendations: Remove water pitcher   Follow up Recommendations       Frequency and Duration min 3x week  1 week       Prognosis Prognosis for Safe Diet Advancement: Good Barriers to Reach Goals: Severity of deficits      Swallow Study   General Date of Onset: 04/24/16 Type of Study: Bedside Swallow Evaluation Diet Prior to this Study: NPO Temperature Spikes Noted: N/A Respiratory Status: Nasal  cannula History of Recent Intubation: Yes Length of Intubations (days): 3 days Date extubated: 04/27/16 Behavior/Cognition: Alert;Cooperative;Pleasant mood Oral Cavity Assessment: Within Functional Limits Oral Care Completed by SLP: Yes Oral Cavity - Dentition: Adequate natural dentition Vision: Functional for self-feeding Self-Feeding Abilities: Able to feed self Patient Positioning: Upright in bed Baseline Vocal Quality: Hoarse Volitional Cough: Weak Volitional Swallow: Able to elicit    Oral/Motor/Sensory Function Overall Oral Motor/Sensory Function: Within functional limits   Ice Chips Ice chips: Within functional limits Presentation: Spoon   Thin Liquid Thin Liquid: Impaired Presentation: Spoon;Cup;Self Fed Oral Phase Impairments: Reduced labial seal Oral Phase Functional Implications: Prolonged oral transit Pharyngeal  Phase Impairments: Throat Clearing - Immediate    Nectar Thick Nectar Thick Liquid: Within functional limits Presentation: Cup;Spoon;Self Fed   Honey Thick Honey Thick Liquid: Within functional limits Presentation: Cup;Spoon;Self fed   Puree Puree: Within functional limits Presentation: Self Fed;Spoon   Solid   GO   Solid: Within functional limits Presentation: Self Fed;Spoon Other Comments: WFL for soft solids, however not recommended to move to regular at this time.        Stacie Harris Sauber 04/28/2016,1:08 PM

## 2016-04-28 NOTE — Progress Notes (Signed)
Nutrition Follow-up  DOCUMENTATION CODES:   Obesity unspecified  INTERVENTION:  -Cater to pt preferences, feeding assistance as needed -Add Magic Cup BID, Mighty Shakes on meal trays   NUTRITION DIAGNOSIS:   Inadequate oral intake related to acute illness as evidenced by NPO status.  Being addressed as diet advanced post extubation, adding supplement  GOAL:   Provide needs based on ASPEN/SCCM guidelines  MONITOR:   TF tolerance, Vent status, Labs, Weight trends  REASON FOR ASSESSMENT:   Ventilator, Consult Enteral/tube feeding initiation and management  ASSESSMENT:   70 yo female admitted with AMS, seizures requiring intubation on 04/24/16  Pt s/p extubation 6/24, diet advanced this AM, no po intake yet. Pt lethargic at times, work-up still in progress  Diet Order:  DIET DYS 2 Room service appropriate?: Yes; Fluid consistency:: Nectar Thick  Skin:  Reviewed, no issues  Last BM:  no documented BM   Labs: reviewed  Meds: reviewed  Height:   Ht Readings from Last 1 Encounters:  04/24/16 5\' 3"  (1.6 m)    Weight:   Wt Readings from Last 1 Encounters:  04/28/16 174 lb 13.2 oz (79.3 kg)    BMI:  Body mass index is 30.98 kg/(m^2).  Estimated Nutritional Needs:   Kcal:  [phone] kcals  Protein:  >/= 104 g  Fluid:  >/= 1.5 L  EDUCATION NEEDS:   No education needs identified at this time  [place], [place], [place] [phone] Pager  [phone] Weekend/On-Call Pager

## 2016-04-29 LAB — CBC
HEMATOCRIT: 34 % — AB (ref 35.0–47.0)
HEMOGLOBIN: 11.6 g/dL — AB (ref 12.0–16.0)
MCH: 28.5 pg (ref 26.0–34.0)
MCHC: 34.1 g/dL (ref 32.0–36.0)
MCV: 83.5 fL (ref 80.0–100.0)
Platelets: 217 10*3/uL (ref 150–440)
RBC: 4.07 MIL/uL (ref 3.80–5.20)
RDW: 14.2 % (ref 11.5–14.5)
WBC: 6 10*3/uL (ref 3.6–11.0)

## 2016-04-29 LAB — BASIC METABOLIC PANEL
ANION GAP: 8 (ref 5–15)
BUN: 10 mg/dL (ref 6–20)
CO2: 25 mmol/L (ref 22–32)
Calcium: 8.6 mg/dL — ABNORMAL LOW (ref 8.9–10.3)
Chloride: 106 mmol/L (ref 101–111)
Creatinine, Ser: 0.62 mg/dL (ref 0.44–1.00)
GLUCOSE: 160 mg/dL — AB (ref 65–99)
POTASSIUM: 3.5 mmol/L (ref 3.5–5.1)
SODIUM: 139 mmol/L (ref 135–145)

## 2016-04-29 LAB — GLUCOSE, CAPILLARY
GLUCOSE-CAPILLARY: 160 mg/dL — AB (ref 65–99)
GLUCOSE-CAPILLARY: 170 mg/dL — AB (ref 65–99)
Glucose-Capillary: 241 mg/dL — ABNORMAL HIGH (ref 65–99)
Glucose-Capillary: 214 mg/dL — ABNORMAL HIGH (ref 65–99)

## 2016-04-29 LAB — CULTURE, BLOOD (ROUTINE X 2)
CULTURE: NO GROWTH
Culture: NO GROWTH

## 2016-04-29 MED ORDER — POTASSIUM CHLORIDE 20 MEQ PO PACK
40.0000 meq | PACK | Freq: Once | ORAL | Status: AC
  Administered 2016-04-29: 40 meq via ORAL
  Filled 2016-04-29: qty 2

## 2016-04-29 NOTE — Progress Notes (Signed)
[place] for electrolyte management   No Known Allergies  Patient Measurements: Height: 5\' 3"  (160 cm) Weight: 179 lb 14.3 oz (81.6 kg) IBW/kg (Calculated) : 52.4   Vital Signs: Temp: 98.4 F (36.9 C) (06/27 1100) Temp Source: Oral (06/27 1100) BP: 117/71 mmHg (06/27 1200) Pulse Rate: 86 (06/27 1533) Intake/Output from previous day: 06/26 0701 - 06/27 0700 In: 2524 [P.O.:200; I.V.:1000; IV Piggyback:1324] Out: 1500 [Urine:1500] Intake/Output from this shift: Total I/O In: 240 [P.O.:240] Out: 750 [Urine:750]  Labs:  Recent Labs  04/26/16 1724 04/27/16 0505 04/29/16 0530  WBC  --  6.3 6.0  HGB  --  11.0* 11.6*  HCT  --  32.1* 34.0*  PLT  --  143* 217  CREATININE  --  0.64 0.62  MG 1.7  --   --   PHOS 2.4*  --   --    Estimated Creatinine Clearance: 67.2 mL/min (by C-G formula based on Cr of 0.62).   Assessment: Pharmacy consulted for electrolyte management for 70 yo female.   Plan:  Will order potassium 40mEq PO x 1.   Will recheck electrolytes with am labs on 6/29.    Pharmacy will continue to monitor and adjust per consult.    Simpson,Michael L 04/29/2016,3:57 PM

## 2016-04-29 NOTE — Progress Notes (Signed)
Speech Language Pathology Treatment: Dysphagia  Patient Details Name: TAVIONA TRIBE MRN: [id number] DOB: 1946/03/10 Today's Date: 04/29/2016 Time: 1510-1550 SLP Time Calculation (min) (ACUTE ONLY): 40 min  Assessment / Plan / Recommendation Clinical Impression  Pt appears to be tolerating the currently recommended dysphagia diet w/ no reported s/s of aspiration during po intake/meals. No oral phase deficits are apparent w/ this diet consistency. Recommend trials to upgrade diet consistency tomorrow; education given on aspiration precautions and feeding support; food and liquid options and consistency. Recommend meds be given in puree for safer, easier swallowing at this time. ST services will f/u w/ po trials tomorrow. Answered Husband's questions. NSG present and agreed.    HPI        SLP Plan  Continue with current plan of care     Recommendations  Diet recommendations: Dysphagia 1 (puree);Nectar-thick liquid (trials to upgrade tomorrow) Liquids provided via: Cup Medication Administration: Crushed with puree Supervision: Staff to assist with self feeding;Full supervision/cueing for compensatory strategies Compensations: Minimize environmental distractions;Slow rate;Small sips/bites;Follow solids with liquid Postural Changes and/or Swallow Maneuvers: Seated upright 90 degrees             General recommendations:  (Dietician following) Oral Care Recommendations: Oral care BID;Staff/trained caregiver to provide oral care Follow up Recommendations: Skilled Nursing facility (TBD) Plan: Continue with current plan of care     Finger, Martin, CCC-SLP  Mandell Pangborn 04/29/2016, 5:04 PM

## 2016-04-29 NOTE — Progress Notes (Addendum)
Subjective: Patient resting but easily awakened.  More spontaneous speech.  No seizure activity noted.    Objective: Current vital signs: BP 155/83 mmHg  Pulse 86  Temp(Src) 98.2 F (36.8 C) (Oral)  Resp 23  Ht 5\' 3"  (1.6 m)  Wt 81.6 kg (179 lb 14.3 oz)  BMI 31.88 kg/m2  SpO2 100% Vital signs in last 24 hours: Temp:  [98 F (36.7 C)-98.5 F (36.9 C)] 98.2 F (36.8 C) (06/27 0200) Pulse Rate:  [66-88] 86 (06/27 0700) Resp:  [18-30] 23 (06/27 0700) BP: (108-159)/(55-106) 155/83 mmHg (06/27 0700) SpO2:  [94 %-100 %] 100 % (06/27 0700) Weight:  [81.6 kg (179 lb 14.3 oz)] 81.6 kg (179 lb 14.3 oz) (06/27 0500)  Intake/Output from previous day: 06/26 0701 - 06/27 0700 In: 2524 [P.O.:200; I.V.:1000; IV Piggyback:1324] Out: 1500 [Urine:1500] Intake/Output this shift:   Nutritional status: DIET DYS 2 Room service appropriate?: Yes; Fluid consistency:: Nectar Thick  Neurologic Exam: Mental Status: Alert once awakened. Spontaneous speech improved with patient asking husband if he has eaten today and reporting that she has not eaten.  Is able to tell me her name and her husband's name but can not tell me either middle name.  Continues to have difficulty following commands such as putting up two fingers or raising her legs.   Cranial Nerves: II: Discs flat bilaterally; Pupils equal, round, reactive to light and accommodation III,IV, VI: ptosis not present, extra-ocular motions intact bilaterally V,VII: no facial asymmetry noted.  VIII: hearing normal bilaterally IX,X: gag reflex present XI: unable to test XII: unable to test Motor: Moves all extremities against gravity with no focal weakness noted.  Sensory: Appreciates tactile stimuli in all extremities  Lab Results: Basic Metabolic Panel:  Recent Labs Lab 04/24/16 1138 04/25/16 0410 04/25/16 1415 04/26/16 0500 04/26/16 1724 04/27/16 0505 04/29/16 0530  NA 130* 132*  --  135  --  136 139  K 3.2* 3.5  --  3.2*  --   3.6 3.5  CL 91* 104  --  104  --  102 106  CO2 18* 23  --  25  --  27 25  GLUCOSE 141* 131*  --  173*  --  111* 160*  BUN 15 10  --  10  --  7 10  CREATININE 1.24* 0.80  --  0.62  --  0.64 0.62  CALCIUM 8.4* 6.7*  --  7.4*  --  7.9* 8.6*  MG 1.8 2.2 2.1 1.9 1.7  --   --   PHOS 1.7* 2.5 2.2* 2.3* 2.4*  --   --     Liver Function Tests:  Recent Labs Lab 04/24/16 0750 04/24/16 1138  AST 29 39  ALT 19 20  ALKPHOS 47 49  BILITOT 0.7 0.5  PROT 7.2 7.5  ALBUMIN 4.2 4.3   No results for input(s): LIPASE, AMYLASE in the last 168 hours. No results for input(s): AMMONIA in the last 168 hours.  CBC:  Recent Labs Lab 04/24/16 0750 04/24/16 1138 04/25/16 0410 04/27/16 0505 04/29/16 0530  WBC 11.6* 20.2* 11.0 6.3 6.0  NEUTROABS 8.4* 11.7*  --   --   --   HGB 13.5 13.3 10.8* 11.0* 11.6*  HCT 38.7 38.6 31.7* 32.1* 34.0*  MCV 82.1 85.3 82.8 84.6 83.5  PLT 226 269 203 143* 217    Cardiac Enzymes:  Recent Labs Lab 04/24/16 0750  TROPONINI <0.03    Lipid Panel: No results for input(s): CHOL, TRIG, HDL, CHOLHDL, VLDL, LDLCALC  in the last 168 hours.  CBG:  Recent Labs Lab 04/28/16 0729 04/28/16 1130 04/28/16 1707 04/28/16 2141 04/29/16 0805  GLUCAP 131* 152* 220* 134* 160*    Microbiology: Results for orders placed or performed during the hospital encounter of 04/24/16  Blood culture (routine x 2)     Status: None   Collection Time: 04/24/16 11:38 AM  Result Value Ref Range Status   Specimen Description BLOOD RIGHT ASSIST CONTROL  Final   Special Requests   Final    BOTTLES DRAWN AEROBIC AND ANAEROBIC  AERO 20CC ANA [place]   Culture NO GROWTH 5 DAYS  Final   Report Status 04/29/2016 FINAL  Final  CSF culture     Status: None   Collection Time: 04/24/16 11:38 AM  Result Value Ref Range Status   Specimen Description CSF  Final   Special Requests Normal  Final   Gram Stain RARE WBC SEEN NO ORGANISMS SEEN   Final   Culture   Final    NO GROWTH 3  DAYS Performed at [facility]    Report Status 04/28/2016 FINAL  Final  Culture, blood (routine x 2)     Status: None   Collection Time: 04/24/16 11:38 AM  Result Value Ref Range Status   Specimen Description BLOOD RIGHT ASSIST CONTROL  Final   Special Requests   Final    BOTTLES DRAWN AEROBIC AND ANAEROBIC  AERO 10CC ANA [place]   Culture NO GROWTH 5 DAYS  Final   Report Status 04/29/2016 FINAL  Final  MRSA PCR Screening     Status: None   Collection Time: 04/24/16  3:20 PM  Result Value Ref Range Status   MRSA by PCR NEGATIVE NEGATIVE Final    Comment:        The GeneXpert MRSA Assay (FDA approved for NASAL specimens only), is one component of a comprehensive MRSA colonization surveillance program. It is not intended to diagnose MRSA infection nor to guide or monitor treatment for MRSA infections.   Urine culture     Status: None   Collection Time: 04/24/16  4:31 PM  Result Value Ref Range Status   Specimen Description URINE, CLEAN CATCH  Final   Special Requests Normal  Final   Culture NO GROWTH Performed at [facility]   Final   Report Status 04/25/2016 FINAL  Final  Culture, respiratory (NON-Expectorated)     Status: None   Collection Time: 04/24/16  4:31 PM  Result Value Ref Range Status   Specimen Description TRACHEAL ASPIRATE  Final   Special Requests NONE  Final   Gram Stain   Final    ABUNDANT WBC PRESENT,BOTH PMN AND MONONUCLEAR FEW GRAM POSITIVE COCCI IN PAIRS RARE GRAM VARIABLE ROD    Culture   Final    Consistent with normal respiratory flora. Performed at [facility]    Report Status 04/27/2016 FINAL  Final    Coagulation Studies: No results for input(s): LABPROT, INR in the last 72 hours.  Imaging: No results found.  Medications:  I have reviewed the patient's current medications. Scheduled: . acyclovir  800 mg Intravenous Q8H  . enoxaparin (LOVENOX) injection  40 mg Subcutaneous Q24H  . famotidine  20 mg Oral BID   . insulin aspart  0-15 Units Subcutaneous TID WC  . insulin aspart  0-5 Units Subcutaneous QHS  . levETIRAcetam  500 mg Intravenous Q12H  . levothyroxine  100 mcg Oral QAC breakfast    Assessment/Plan: Patient continues  to show improvement.  Today is day 4 of 10 of Acyclovir.  Appears to be tolerating well.  Titer unable to be obtained due to lack of enough fluid.    Recommendations: 1.  Agree with continued Acyclovir. 2.  Continue Keppra at current dose and maintain seizure precautions   LOS: 5 days   Alexis Goodell, MD Neurology [phone] 04/29/2016  11:13 AM

## 2016-04-29 NOTE — Care Management (Signed)
70 yo female admitted from home with spouse with altered mental status, developed an increase temperature, developed seizures and became more altered. Patient was intubated 06/22, extubated 06/24. Now on RA. PT consult pending. Plan is transfer to the floor.

## 2016-04-29 NOTE — Progress Notes (Signed)
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    ASSESSMENT / PLAN:  NEUROLOGIC  Seizures ?Encephalitis/ meningitis-MRI consistent with encephalitis; exact etiology unclear; final CSF cultures pending Altered mental status P:  Neurology consult appreciated will continue abx per ID recs.  Continue Kepra per neuro recs.   PULMONARY A: Acute hypoxemic/hypercarbic respiratory failure related to seizures s/p extubation 06/24 Currently well compensated, now on RA.   P:  Supplemental O2 prn Chest PT, flutter valve and IS WA    CARDIOVASCULAR A:  No active issues P:  Continuous ICU monitoring Keep MAP goals>65  RENAL A:  Acute kidney Injury Hyponatremia hypokalemia UTI P:  Replace electrolytes per ICU protocol Follow chemistry  GASTROINTESTINAL A:  No active issues P:  Continue Gi prophylaxis.   HEMATOLOGIC A:  No active issues P:  scds lovenox for DVT prophylaxis Transfuse if Hgb<7  INFECTIOUS A:  Leucocytosis Meningitis P:  Follow cultures Continue acyclovir per ID recs-- no need for airborne isolation.   CULTURES: 6/22 blood culture>> 6/22 urine culture>> No growth to date 04/24/16: WBCs, gram-positive cocci in pairs MRSA screen negative Sputum culture 04/24/2016-WBCs, GPC in pairs CSF culture 04/24/2016: NGTD HSV titer: pending HIV: Non reactive  STUDIES:  6/22 LP>>  ANTIBIOTICS: 6/22 vancomycin>>6/26 6/22 ceftriaxone>>6/26 6/22 acyclovir>> 6/22 ampicillin>>04/25/2016  LINES/TUBES: 6/22 ET tube>>04/26/16 6/22 [facility] CVL>>>  ENDOCRINE A:  Diabetese Melitus  Hypothyroidism P:  Blood sugar checks SSI coverage Continue Synthroid  Disposition and family  update: Patient's husband updated at bedside   Best Practice: Code Status: Full. Diet: advance diet as tolerated.  GI prophylaxis: PPI. VTE prophylaxis: SCD's / heparin.      SIGNIFICANT EVENTS: 6/22>>Patient presented to [facility] with Altered mental status, febrile and ended up having seizures requiring intubation and now on mechanically ventilated ---------------------------------------   ----------------------------------------   Name: Cathy Harrington MRN: [id number] DOB: 1946/09/23    ADMISSION DATE:  04/24/2016   SUBJECTIVE:   Pt currently awake and alert but very confused, states her own name but can not answer questions.    VITAL SIGNS: Temp:  [98 F (36.7 C)-98.5 F (36.9 C)] 98.2 F (36.8 C) (06/27 0200) Pulse Rate:  [65-88] 86 (06/27 0700) Resp:  [18-30] 23 (06/27 0700) BP: (104-159)/(53-106) 155/83 mmHg (06/27 0700) SpO2:  [93 %-100 %] 100 % (06/27 0700) Weight:  [179 lb 14.3 oz (81.6 kg)] 179 lb 14.3 oz (81.6 kg) (06/27 0500) HEMODYNAMICS:   VENTILATOR SETTINGS:   INTAKE / OUTPUT:  Intake/Output Summary (Last 24 hours) at 04/29/16 0736 Last data filed at 04/29/16 0645  Gross per 24 hour  Intake   2524 ml  Output   1500 ml  Net   1024 ml    PHYSICAL EXAMINATION: Physical Examination:   VS: BP 155/83 mmHg  Pulse 86  Temp(Src) 98.2 F (36.8 C) (Oral)  Resp 23  Ht 5\' 3"  (1.6 m)  Wt 179 lb 14.3 oz (81.6 kg)  BMI 31.88 kg/m2  SpO2 100%  General Appearance: No distress  Neuro:without focal findings, mental status reduced, arousable but confused.  Still Has trouble following commands.  HEENT: PERRLA, EOM intact. Pulmonary: normal breath sounds   CardiovascularNormal S1,S2.  No m/r/g.   Abdomen: Benign, Soft, non-tender. Renal:  No costovertebral  tenderness  GU:  Not performed at this time. Endocrine: No evident thyromegaly. Skin:   warm, no rashes, no ecchymosis  Extremities: normal, no cyanosis, clubbing.   LABS:   LABORATORY PANEL:    CBC  Recent Labs Lab 04/29/16 0530  WBC 6.0  HGB 11.6*  HCT 34.0*  PLT 217    Chemistries   Recent Labs Lab 04/24/16 1138  04/26/16 1724 04/27/16 0505  NA 130*  < >  --  136  K 3.2*  < >  --  3.6  CL 91*  < >  --  102  CO2 18*  < >  --  27  GLUCOSE 141*  < >  --  111*  BUN 15  < >  --  7  CREATININE 1.24*  < >  --  0.64  CALCIUM 8.4*  < >  --  7.9*  MG 1.8  < > 1.7  --   PHOS 1.7*  < > 2.4*  --   AST 39  --   --   --   ALT 20  --   --   --   ALKPHOS 49  --   --   --   BILITOT 0.5  --   --   --   < > = values in this interval not displayed.   Recent Labs Lab 04/28/16 0405 04/28/16 0441 04/28/16 0729 04/28/16 1130 04/28/16 1707 04/28/16 2141  GLUCAP 148* 126* 131* 152* 220* 134*    Recent Labs Lab 04/24/16 1230 04/25/16 0451  PHART 7.42 7.47*  PCO2ART 35 31*  PO2ART 144* 96    Recent Labs Lab 04/24/16 0750 04/24/16 1138  AST 29 39  ALT 19 20  ALKPHOS 47 49  BILITOT 0.7 0.5  ALBUMIN 4.2 4.3    Cardiac Enzymes  Recent Labs Lab 04/24/16 0750  TROPONINI <0.03    RADIOLOGY:  No results found.     Marda Stalker, MD.  ICU Pager: [phone] [facility] Office Number: [phone]  Patricia Pesa, M.D.  Vilinda Boehringer, M.D.  Merton Border, M.D  04/29/2016   Critical Care Attestation.  I have personally obtained a history, examined the patient, evaluated laboratory and imaging results, formulated the assessment and plan and placed orders. The Patient requires high complexity decision making for assessment and support, frequent evaluation and titration of therapies, application of advanced monitoring technologies and extensive interpretation of multiple databases. The patient has critical illness that could lead imminently to failure of 1 or more organ systems and requires the highest level of physician preparedness to intervene.  Critical Care Time devoted to patient care services described in this note is  35  minutes and is exclusive of time spent in procedures.

## 2016-04-29 NOTE — Evaluation (Signed)
Physical Therapy Evaluation Patient Details Name: Cathy Harrington MRN: [id number] DOB: December 14, 1945 Today's Date: 04/29/2016   History of Present Illness  70 yo F presented to ED for AMS on 6/22. She was admitte for seizures and encephalitis with possibility of viral menengitis. She was intubated and sedated 6/22 and extubated and taken off sedation 6/24.   Clinical Impression  Pt demonstrated generalized weakness and difficulty walking after extended hospitalization. Her PLOF was independent with community ambulation without AD. She now requires min guard for bed mobility. Transfers and ambulation up to 200 ft with FWW required min guard with +2 for equipment/safety. Cues given for safety and navigation. Pt's remains confused and is not at her baseline. HHPT is recommended after hospital discharge to address deficits of strength, endurance, balance, transfers and gait to progress towards PLOF. 24 hour supervision is recommended due to pt's decreased cognition and safety concerns. Pt will benefit from skilled PT services to increase functional I and mobility for safe discharge.     Follow Up Recommendations Home health PT;Supervision/Assistance - 24 hour (supervision due to cognitive concern for safety)    Equipment Recommendations  Rolling walker with 5" wheels    Recommendations for Other Services       Precautions / Restrictions Precautions Precautions: Fall Restrictions Weight Bearing Restrictions: No      Mobility  Bed Mobility Overal bed mobility: Needs Assistance Bed Mobility: Supine to Sit     Supine to sit: Min guard;HOB elevated     General bed mobility comments: increased time needed, uses rails  Transfers Overall transfer level: Needs assistance Equipment used: Rolling walker (2 wheeled) Transfers: Sit to/from [facility] Sit to Stand: Min guard Stand pivot transfers: Min guard;+2 safety/equipment       General transfer comment: steady with no  LOB  Ambulation/Gait Ambulation/Gait assistance: Min guard;+2 safety/equipment Ambulation Distance (Feet): 225 Feet Assistive device: Rolling walker (2 wheeled) Gait Pattern/deviations: Decreased stride length;Trunk flexed Gait velocity: reduced Gait velocity interpretation: Below normal speed for age/gender General Gait Details: Fair speed and steady gait with occasional cues for safety. Cues for staying close to [facility] and navigation. No LOB.  Stairs            Wheelchair Mobility    Modified Rankin (Stroke Patients Only)       Balance Overall balance assessment: Needs assistance Sitting-balance support: Bilateral upper extremity supported Sitting balance-Leahy Scale: Good     Standing balance support: Bilateral upper extremity supported Standing balance-Leahy Scale: Fair Standing balance comment: maintains with UE support without LOB                             Pertinent Vitals/Pain Pain Assessment: No/denies pain    Home Living Family/patient expects to be discharged to:: Private residence Living Arrangements: Spouse/significant other Available Help at Discharge: Family;Available 24 hours/day (family stated they should be able to have 24 hr supervisio) Type of Home: House Home Access: Stairs to enter Entrance Stairs-Rails: Right Entrance Stairs-Number of Steps: 3 Home Layout: Two level;Able to live on main level with bedroom/bathroom;Laundry or work area in [employer]: None      Prior Function Level of Independence: Independent         Comments: Pt was independent with ADLs and ambulatory without AD.     Hand Dominance        Extremity/Trunk Assessment   Upper Extremity Assessment: Generalized weakness (grossly 4-/5)  Lower Extremity Assessment: Generalized weakness (grossly4-/5)         Communication   Communication: No difficulties  Cognition Arousal/Alertness: Awake/alert Behavior During Therapy:  Flat affect Overall Cognitive Status: Impaired/Different from baseline Area of Impairment: Orientation;Attention;Memory;Following commands;Awareness Orientation Level: Place;Time;Situation Current Attention Level: Alternating Memory: Decreased short-term memory Following Commands: Follows one step commands inconsistently       General Comments: Pt able to follow most simple commands. Visual cues helps.    General Comments General comments (skin integrity, edema, etc.): L>R UE swelling with blisters    Exercises Other Exercises Other Exercises: Pt ambulated 50 ft and 200 ft with FWW and min guard with +2 for equipment/safety. She demonstrated fair speed and stability with no LOB or buckling. Occasional standing rest breaks for energy conservation and pursed lip breathing.       Assessment/Plan    PT Assessment Patient needs continued PT services  PT Diagnosis Difficulty walking;Generalized weakness   PT Problem List Decreased strength;Decreased activity tolerance;Decreased balance;Decreased knowledge of use of DME;Decreased knowledge of precautions;Decreased skin integrity  PT Treatment Interventions DME instruction;Gait training;Stair training;Therapeutic activities;Therapeutic exercise;Balance training;Neuromuscular re-education;Patient/family education   PT Goals (Current goals can be found in the Care Plan section) Acute Rehab PT Goals Patient Stated Goal: to walk PT Goal Formulation: With patient/family Time For Goal Achievement: 05/13/16 Potential to Achieve Goals: Fair    Frequency Min 2X/week   Barriers to discharge Inaccessible home environment steps to enter    Co-evaluation               End of Session Equipment Utilized During Treatment: Gait belt Activity Tolerance: Patient tolerated treatment well Patient left: in chair;with call bell/phone within reach;with chair alarm set;with family/visitor present;with SCD's reapplied Nurse Communication: Mobility  status         Time: 1500-1527 PT Time Calculation (min) (ACUTE ONLY): 27 min   Charges:   PT Evaluation $PT Eval Moderate Complexity: 1 Procedure PT Treatments $Gait Training: 8-22 mins   PT G Codes:        Neoma Laming, PT, DPT  04/29/2016, 3:49 PM [phone]

## 2016-04-29 NOTE — Progress Notes (Signed)
This RN spoke with Cathy Harrington with infection control in regards to pt being on airborne and contact precautions. Pt was placed on these precautions due to possible shingles. Per Dr. Ashby Dawes he has not noted any signs of shingles and stated pt can be taken off of these precautions. [name] E 8:43 AM 04/29/2016

## 2016-04-30 LAB — CBC
HEMATOCRIT: 32.5 % — AB (ref 35.0–47.0)
HEMOGLOBIN: 11.1 g/dL — AB (ref 12.0–16.0)
MCH: 28.4 pg (ref 26.0–34.0)
MCHC: 34.2 g/dL (ref 32.0–36.0)
MCV: 83 fL (ref 80.0–100.0)
Platelets: 244 10*3/uL (ref 150–440)
RBC: 3.92 MIL/uL (ref 3.80–5.20)
RDW: 14.1 % (ref 11.5–14.5)
WBC: 7.6 10*3/uL (ref 3.6–11.0)

## 2016-04-30 LAB — BASIC METABOLIC PANEL
ANION GAP: 7 (ref 5–15)
BUN: 14 mg/dL (ref 6–20)
CHLORIDE: 107 mmol/L (ref 101–111)
CO2: 24 mmol/L (ref 22–32)
CREATININE: 0.7 mg/dL (ref 0.44–1.00)
Calcium: 8.9 mg/dL (ref 8.9–10.3)
GFR calc non Af Amer: >60 mL/min (ref >60)
Glucose, Bld: 202 mg/dL — ABNORMAL HIGH (ref 65–99)
Potassium: 3.7 mmol/L (ref 3.5–5.1)
Sodium: 138 mmol/L (ref 135–145)

## 2016-04-30 LAB — GLUCOSE, CAPILLARY
GLUCOSE-CAPILLARY: 258 mg/dL — AB (ref 65–99)
GLUCOSE-CAPILLARY: 284 mg/dL — AB (ref 65–99)
GLUCOSE-CAPILLARY: 306 mg/dL — AB (ref 65–99)
Glucose-Capillary: 171 mg/dL — ABNORMAL HIGH (ref 65–99)

## 2016-04-30 MED ORDER — LEVETIRACETAM 500 MG PO TABS
500.0000 mg | ORAL_TABLET | Freq: Two times a day (BID) | ORAL | Status: DC
  Administered 2016-04-30 – 2016-05-02 (×5): 500 mg via ORAL
  Filled 2016-04-30 (×4): qty 1

## 2016-04-30 NOTE — Progress Notes (Signed)
1530 report called to [place] on 1A.

## 2016-04-30 NOTE — Care Management (Signed)
TC received from [facility] at [facility]. RNCM faxed H & P, neurology, PT, OT and SLP notes. Will wait answer regarding acceptance to their program.

## 2016-04-30 NOTE — Progress Notes (Addendum)
[place] [facility] [place] NAME: Cathy Harrington    MR#:  [id number]  DATE OF BIRTH:  [birth year]  SUBJECTIVE:  CHIEF COMPLAINT:   Chief Complaint  Patient presents with  . Altered Mental Status   Admitted for AMS. Still has some diffikculty with memory and keep repeting words per son who is at bedside. Eating soft food REVIEW OF SYSTEMS:  Review of Systems  Constitutional: Negative for fever, weight loss, malaise/fatigue and diaphoresis.  HENT: Negative for ear discharge, ear pain, hearing loss, nosebleeds, sore throat and tinnitus.   Eyes: Negative for blurred vision and pain.  Respiratory: Negative for cough, hemoptysis, shortness of breath and wheezing.   Cardiovascular: Negative for chest pain, palpitations, orthopnea and leg swelling.  Gastrointestinal: Negative for heartburn, nausea, vomiting, abdominal pain, diarrhea, constipation and blood in stool.  Genitourinary: Negative for dysuria, urgency and frequency.  Musculoskeletal: Negative for myalgias and back pain.  Skin: Negative for itching and rash.  Neurological: Negative for dizziness, tingling, tremors, focal weakness, seizures, weakness and headaches.  Psychiatric/Behavioral: Negative for depression. The patient is not nervous/anxious.    DRUG ALLERGIES:  No Known Allergies VITALS:  Blood pressure 165/85, pulse 102, temperature 98.2 F (36.8 C), temperature source Oral, resp. rate 20, height 5\' 3"  (1.6 m), weight 83.462 kg (184 lb), SpO2 97 %. PHYSICAL EXAMINATION:  Physical Exam  Constitutional: She is well-developed, well-nourished, and in no distress.  HENT:  Head: Normocephalic and atraumatic.  Eyes: Conjunctivae and EOM are normal. Pupils are equal, round, and reactive to light.  Neck: Normal range of motion. Neck supple. No tracheal deviation present. No thyromegaly present.  Cardiovascular: Normal rate, regular rhythm and normal heart sounds.   Pulmonary/Chest: Effort  normal and breath sounds normal. No respiratory distress. She has no wheezes. She exhibits no tenderness.  Abdominal: Soft. Bowel sounds are normal. She exhibits no distension. There is no tenderness.  Musculoskeletal: Normal range of motion.  Neurological: She is alert. She displays abnormal speech. No cranial nerve deficit.  Difficulty comprehending clearly  Skin: Skin is warm and dry. No rash noted.  Psychiatric: Mood and affect normal.   LABORATORY PANEL:   CBC  Recent Labs Lab 04/30/16 0508  WBC 7.6  HGB 11.1*  HCT 32.5*  PLT 244   ------------------------------------------------------------------------------------------------------------------ Chemistries   Recent Labs Lab 04/24/16 1138  04/26/16 1724  04/30/16 0508  NA 130*  < >  --   < > 138  K 3.2*  < >  --   < > 3.7  CL 91*  < >  --   < > 107  CO2 18*  < >  --   < > 24  GLUCOSE 141*  < >  --   < > 202*  BUN 15  < >  --   < > 14  CREATININE 1.24*  < >  --   < > 0.70  CALCIUM 8.4*  < >  --   < > 8.9  MG 1.8  < > 1.7  --   --   AST 39  --   --   --   --   ALT 20  --   --   --   --   ALKPHOS 49  --   --   --   --   BILITOT 0.5  --   --   --   --   < > = values in this interval not displayed.  RADIOLOGY:  No results found. ASSESSMENT AND PLAN:  70 year old with past medical history significant for diabetes mellitus and hypothyroidism.Patient presented to ED on 6/22 with altered mental status   * Viral encephalopathy - CSF with elevated protein - ID recommends 14 days with IV acyclovir every 8 hours - HSV titers pending - MRI consistent with encephalitis  * seizure - on keppra  * Acute hypoxemic/hypercarbic respiratory failure related to seizures s/p extubation 06/24 - was in ICU till this am, now on RA  * Hypothyroidism - continue synthroid  * ARF/Hyponatremia/Hypokalemia - resolved with hydration  PT/OT eval - family interested in inpt rehab  All the records are reviewed and case discussed with  Care Management/Social Worker. Management plans discussed with the patient, family and they are in agreement.  CODE STATUS: FULL CODE  TOTAL TIME TAKING CARE OF THIS PATIENT: 35 minutes.   More than 50% of the time was spent in counseling/coordination of care: YES  POSSIBLE D/C IN 1-2 DAYS, DEPENDING ON CLINICAL CONDITION.   [facility], Isao Seltzer M.D on 04/30/2016 at 5:56 PM  Between 7am to 6pm - Pager - [phone]  After 6pm go to [email or web address] - [employer] brewer [facility]  Office  [phone]  CC: Primary care physician; No primary care provider on file.  Note: This dictation was prepared with Dragon dictation along with smaller phrase technology. Any transcriptional errors that result from this process are unintentional.

## 2016-04-30 NOTE — Progress Notes (Signed)
Speech Language Pathology Treatment: Dysphagia  Patient Details Name: Cathy Harrington MRN: [id number] DOB: 15-Jul-1946 Today's Date: 04/30/2016 Time: 1515-1600 SLP Time Calculation (min) (ACUTE ONLY): 45 min  Assessment / Plan / Recommendation Clinical Impression  Pt has been tolerating the recommended dysphagia diet of Dys. 2 w/ Nectar liquids w/ no reported overt s/s of aspiration by NSG. Pt walking w/ PT now and is more verbal w/ family/husband but Cognition continues to be impaired for higher level tasks and communication. Pt does answer basic y/n questions and indicates wants/needs. She does respond to husband in native language moreso he believes.  Pt was given trials of thin liquids via cup. Pt instructed and able to hold the cup for self-feeding. She was instructed to take single, small sips during trials. Pt consumed ~10-11 trials w/ no overt s/s of aspiration noted; no coughing or throat clearing, no decline in respiratory status(O2 sats 98-99%), no wet vocal quality post trials. No oral phase deficits noted. No trials of increased solids were assessed as pt's husband prefers the chopped foods. Pt appears at reduced risk for aspiration following general aspiration precautions; she requires some assistance during meals for support w/ feeding d/t overall general weakness. Recommend attention to positioning and sitting upright (supported by pillows in lower back). Recommend meds be taken w/ applesauce for easier swallowing. Recommend a Dysphgia 2 w/ thin liquids diet. ST will f/u w/ toleration of diet next 1-2 days. Education given to husband on general aspiration precautions - husband was able to repeat back precautions discussed. ST services will f/u w/ Cognitive screening while admitted for ongoing monitoring of Cognitive improvement.    HPI HPI: Pt is a 70 year old with past medical history significant for diabetes mellitus and hypothyroidism.Patient presented to ED on 6/22 with altered mental  status since Monday. Patient was diagnosed with urinary tract infection and has been taking bactrim since this Monday .Family member was concerned As he states that she has trouble thinking and speaking.she cannot get her words out and is having difficulty in communicating with the family members, she will start to say something in Arabic which is her native language and switch to [place] and does not make any sense at all. When the patient presented to the ER she appeared to be sweaty and staring patient did not follow commands, it is reported that patient has seizures. She was tachypneic and had a temp of 103.1 axillary.er bladder was distended and in the ER, they did in and out cath and was able to drain 1500 mL's out of bladder.her symptoms were concerning for seizure/meningitis/encephalitis and therefore ER performed lumbar puncture and send the specimen to the lab. Patient did not seem to be able to protect the airway and therefore she was intubated and mechanically ventilated. Patient was sent to the ICU for further management. Pt improved and was extubated ~4 days later. Mental status slightly improved and pt has been more verbal w/ family and moving all extremeties now walking w/ PT. Pt is tolerating her current dysphagia 2 diet w/ nectar liquids looking forward to trials of thin liquids w/ ST. Cognition continues to be reduced w/ confusion in more involved tasks/questions. 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       SLP Plan  Continue with current plan of care     Recommendations  Diet recommendations: Dysphagia 2 (fine chop);Thin liquid Liquids provided via: Cup Medication Administration: Whole meds with puree (or crushed in puree if necessary/able to) Supervision: Patient able to self feed;Staff  to assist with self feeding;Full supervision/cueing for compensatory strategies Compensations: Minimize environmental distractions;Slow rate;Small sips/bites;Lingual sweep for clearance of  pocketing;Multiple dry swallows after each bite/sip Postural Changes and/or Swallow Maneuvers: Seated upright 90 degrees;Upright 30-60 min after meal             General recommendations:  (TBD) Oral Care Recommendations: Oral care BID;Staff/trained caregiver to provide oral care Follow up Recommendations: Outpatient SLP;Home health SLP ((TBD) for full Cognitive assessment for ADLs) Plan: Continue with current plan of care     GO               Cathy Harrington, [place], CCC-SLP  Cathy,Harrington 04/30/2016, 3:58 PM

## 2016-04-30 NOTE — Progress Notes (Signed)
Physical Therapy Treatment Patient Details Name: Cathy Harrington MRN: [id number] DOB: [birth year] Today's Date: 04/30/2016    History of Present Illness 70 yo F presented to ED for AMS on 6/22. She was admitte for seizures and encephalitis with possibility of viral menengitis. She was intubated and sedated 6/22 and extubated and taken off sedation 6/24.     PT Comments    Pt gradually progressing towards functional goals. She was more conversational this session and was able to tell PT the names of her children and talk about the pretty weather. Pt ambulated 225 ft with FWW and min guard with fair speed and good stability. Occasional cues for safety. No LOB during session. Plan to progress mobility as tolerated.   Follow Up Recommendations  Home health PT;Supervision/Assistance - 24 hour     Equipment Recommendations  Rolling walker with 5" wheels    Recommendations for Other Services       Precautions / Restrictions Precautions Precautions: Fall Restrictions Weight Bearing Restrictions: No    Mobility  Bed Mobility               General bed mobility comments: in recliner, NT  Transfers Overall transfer level: Needs assistance Equipment used: Rolling walker (2 wheeled) Transfers: Sit to/from [facility] Sit to Stand: Min guard Stand pivot transfers: Min guard       General transfer comment: steady with no LOB  Ambulation/Gait Ambulation/Gait assistance: Min guard;+2 safety/equipment (for chair follow for rest breaks.) Ambulation Distance (Feet): 225 Feet Assistive device: Rolling walker (2 wheeled) Gait Pattern/deviations: Decreased stride length;Narrow base of support;Trunk flexed Gait velocity: reduced Gait velocity interpretation: Below normal speed for age/gender General Gait Details: Fair speed and steady gait with occasional cues for safety. Cues for staying close to [facility] and navigation. No LOB.   Stairs            Wheelchair  Mobility    Modified Rankin (Stroke Patients Only)       Balance Overall balance assessment: Needs assistance Sitting-balance support: No upper extremity supported Sitting balance-Leahy Scale: Good     Standing balance support: No upper extremity supported Standing balance-Leahy Scale: Fair Standing balance comment: maintains static standing balance without UE support                    Cognition Arousal/Alertness: Awake/alert;Lethargic (alternating) Behavior During Therapy: WFL for tasks assessed/performed Overall Cognitive Status: Impaired/Different from baseline (but improved from yesterday) Area of Impairment: Orientation;Attention;Memory;Following commands;Awareness Orientation Level: Place;Time;Situation Current Attention Level: Alternating Memory: Decreased short-term memory Following Commands: Follows one step commands inconsistently       General Comments: Pt able to follow most simple commands. Visual cues helps.    Exercises Other Exercises Other Exercises: Pt ambulated 225 ft x 2 with FWW and min guard with assist for chair follow. She demonstrated fair speed and good stability with no LOB or buckling. Seated rest break between for energy conservation. Cues for staying close to [facility] and navigation.     General Comments General comments (skin integrity, edema, etc.): less UE edema      Pertinent Vitals/Pain Pain Assessment: No/denies pain    Home Living Family/patient expects to be discharged to:: Private residence Living Arrangements: Spouse/significant other Available Help at Discharge: Family;Available 24 hours/day Type of Home: House Home Access: Stairs to enter Entrance Stairs-Rails: Right Home Layout: Two level;Able to live on main level with bedroom/bathroom;Laundry or work area in [employer]: None  Prior Function Level of Independence: Independent      Comments: Pt was independent with ADLs and ambulatory without AD.    PT Goals (current goals can now be found in the care plan section) Acute Rehab PT Goals Patient Stated Goal: to go home PT Goal Formulation: With patient/family Time For Goal Achievement: 05/13/16 Potential to Achieve Goals: Fair Progress towards PT goals: Progressing toward goals    Frequency  Min 2X/week    PT Plan Current plan remains appropriate    Co-evaluation             End of Session Equipment Utilized During Treatment: Gait belt Activity Tolerance: Patient tolerated treatment well Patient left: in chair;with call bell/phone within reach;with chair alarm set;with family/visitor present     Time: [id number] PT Time Calculation (min) (ACUTE ONLY): 1408 min  Charges:  $Gait Training: 23-37 mins                    G Codes:      Neoma Laming, PT, DPT  04/30/2016, 3:25 PM [phone]

## 2016-04-30 NOTE — Evaluation (Signed)
Occupational Therapy Evaluation Patient Details Name: Cathy Harrington MRN: [id number] DOB: [birth year] Today's Date: 04/30/2016    History of Present Illness 70 yo F presented to ED for AMS on 6/22. She was admitte for seizures and encephalitis with possibility of viral menengitis. She was intubated and sedated 6/22 and extubated and taken off sedation 6/24.    Clinical Impression    Pt was sleeping upon arrival of OT. She alerted with tactile and verbal cues.  She is not oriented to place or time, but knows and is able to state her first and last name and write it with her right hand (with one letter mistake). She presents with expressive difficulties and tends to perseverate on recent recall of information.  She was not able to state her husband or son's names, but when asked later in session if the names of Geoffery Spruce and [name] were correct, she nodded yes.  She is able to complete automatic self care tasks like self feeding with right hand but needs cues and assist to open containers she is not familiar with.  She is able to use a pen functionally but is not able to write numbers on a clock but with several cues, she wrote a 3 close to correct position.  When asked to draw a square, she drew a circle.  She is able to follow commands to assess AROM of BUEs and hands but not for testing light touch or sensation.  She was able to follow one step questions formatted in yes/no 75% of the time and improved with repetition.  She is at risk for falling due to decrease safety awareness and poor judgment and problem solving skills with delay in response time.  Her husband arrived toward end of session and rec that he bring in pictures to initiate recall of family members and events to stimulate recall and automatic speech. She would benefit from continued OT in hospital to increase functional ADLs while increasing cognitive skills and executive functions.  Rec continued OT with cognitive remediation at [facility] facility  after discharge.  Will discuss recommendations with case manager Lattie Haw.        Follow Up Recommendations  CIR    Equipment Recommendations       Recommendations for Other Services       Precautions / Restrictions Precautions Precautions: Fall Restrictions Weight Bearing Restrictions: No      Mobility Bed Mobility                  Transfers                      Balance                                            ADL Overall ADL's : Needs assistance/impaired Eating/Feeding: Set up;Minimal assistance;Sitting;Bed level   Grooming: Wash/dry hands;Wash/dry face;Brushing hair;Cueing for sequencing;Minimal assistance;Sitting           Upper Body Dressing : Set up;Moderate assistance                     General ADL Comments: Pt requires extra time to follow one step commands and has expressive difficulties during self care tasks.  She is right handed and able to feed self with set up and cues and assist to open containers. She has decreased problem solving,  decreased executive functioning and repeats information like "July 1" when working on recall of date.  She is able to write her name but wrote AIZA vs Mardi.  She is not able to state the date, place and only name.  She is not able to state her husband or son's first names, but when asked if Geoffery Spruce is her husband's name, she will nod yes. She can have her needs met if asked simple yes/no questions such as "do you need to go to the bathroom/", are you in pain? are you hungry?"     Vision Vision Assessment?: Vision impaired- to be further tested in functional context Additional Comments: difficult to assess at this time but pt indicated her vision is blurry close up---rec further eval   Perception     Praxis      Pertinent Vitals/Pain Pain Assessment: No/denies pain     Hand Dominance Right   Extremity/Trunk Assessment Upper Extremity Assessment Upper Extremity Assessment:  Generalized weakness (L hand and wrist with edema and blisters on dorsal aspect of hand)   Lower Extremity Assessment Lower Extremity Assessment: Defer to PT evaluation       Communication Communication Communication: Receptive difficulties;Expressive difficulties   Cognition Arousal/Alertness: Awake/alert Behavior During Therapy: Flat affect Overall Cognitive Status: Impaired/Different from baseline Area of Impairment: Orientation;Attention;Memory;Following commands;Awareness Orientation Level: Place;Time;Situation Current Attention Level: Alternating Memory: Decreased short-term memory Following Commands: Follows one step commands inconsistently       General Comments: Pt able to follow most simple commands. Visual cues helps. Expressive difficulties but when given yes/no or simple commands to follow she has a delay but can follow 75% of time.   General Comments       Exercises       Shoulder Instructions      Home Living Family/patient expects to be discharged to:: Private residence Living Arrangements: Spouse/significant other Available Help at Discharge: Family;Available 24 hours/day Type of Home: House Home Access: Stairs to enter [employer] of Steps: 3 Entrance Stairs-Rails: Right Home Layout: Two level;Able to live on main level with bedroom/bathroom;Laundry or work area in basement     [employer] Shower/Tub: Risk analyst characteristics: Art gallery manager: None          Prior Functioning/Environment Level of Independence: Independent        Comments: Pt was independent with ADLs and ambulatory without AD.    OT Diagnosis: Generalized weakness;Cognitive deficits;Altered mental status   OT Problem List: Decreased strength;Decreased range of motion;Decreased cognition;Decreased activity tolerance;Decreased safety awareness;Increased edema   OT Treatment/Interventions: Self-care/ADL training;Patient/family  education;Cognitive remediation/compensation;Therapeutic activities    OT Goals(Current goals can be found in the care plan section) Acute Rehab OT Goals Patient Stated Goal: "to eat" OT Goal Formulation: With patient/family Time For Goal Achievement: 05/14/16 Potential to Achieve Goals: Good ADL Goals Pt Will Perform Eating: Independently;with set-up;sitting Pt Will Perform Grooming: with set-up;with min assist;sitting Pt Will Perform Upper Body Dressing: with supervision;with set-up;sitting Pt Will Perform Lower Body Dressing: with min assist;sit to/from stand Additional ADL Goal #1: Pt will use Right hand to write name, date and address with min cues   OT Frequency:  (rec OT as freq as possible for ADLs and cognitive tx)   Barriers to D/C:            Co-evaluation              End of Session    Activity Tolerance: Patient tolerated treatment well  Patient left: in bed;with call bell/phone within reach;with family/visitor present   Time: 1310-1355 OT Time Calculation (min): 45 min Charges:  OT General Charges $OT Visit: 1 Procedure OT Evaluation $OT Eval High Complexity: 1 Procedure OT Treatments $Self Care/Home Management : 8-22 mins $Therapeutic Activity: 8-22 mins G-Codes:     Chrys Racer, OTR/L ascom [phone] 04/30/2016, 2:02 PM

## 2016-04-30 NOTE — Progress Notes (Addendum)
Subjective: Patient in chair.  Has eaten breakfast.    Objective: Current vital signs: BP 112/98 mmHg  Pulse 100  Temp(Src) 98.9 F (37.2 C) (Oral)  Resp 26  Ht 5\' 3"  (1.6 m)  Wt 83.3 kg (183 lb 10.3 oz)  BMI 32.54 kg/m2  SpO2 96% Vital signs in last 24 hours: Temp:  [97.8 F (36.6 C)-99.1 F (37.3 C)] 98.9 F (37.2 C) (06/28 0000) Pulse Rate:  [77-105] 100 (06/28 0200) Resp:  [21-31] 26 (06/28 0200) BP: (112-149)/(59-98) 112/98 mmHg (06/28 0200) SpO2:  [93 %-100 %] 96 % (06/28 0200) Weight:  [83.3 kg (183 lb 10.3 oz)] 83.3 kg (183 lb 10.3 oz) (06/28 0403)  Intake/Output from previous day: 06/27 0701 - 06/28 0700 In: 2130 [P.O.:480; I.V.:1650] Out: 950 [Urine:950] Intake/Output this shift:   Nutritional status: DIET DYS 2 Room service appropriate?: Yes; Fluid consistency:: Nectar Thick  Neurologic Exam: Mental Status: Alert and awake. Remains unable to tell me her middle name.  Follows commands to stick out her tongue.  Will not put up two fingers to command in [place] but does so in Arabic.  Perseverates when unable to answer questions.    Cranial Nerves: II: Discs flat bilaterally; Pupils equal, round, reactive to light and accommodation III,IV, VI: ptosis not present, extra-ocular motions intact bilaterally V,VII: no facial asymmetry noted.  VIII: hearing normal bilaterally IX,X: gag reflex present XI: unable to test XII: unable to test Motor: Moves all extremities against gravity with no focal weakness noted.  Sensory: Appreciates tactile stimuli in all extremities  Lab Results: Basic Metabolic Panel:  Recent Labs Lab 04/24/16 1138 04/25/16 0410 04/25/16 1415 04/26/16 0500 04/26/16 1724 04/27/16 0505 04/29/16 0530 04/30/16 0508  NA 130* 132*  --  135  --  136 139 138  K 3.2* 3.5  --  3.2*  --  3.6 3.5 3.7  CL 91* 104  --  104  --  102 106 107  CO2 18* 23  --  25  --  27 25 24   GLUCOSE 141* 131*  --  173*  --  111* 160* 202*  BUN 15 10  --  10   --  7 10 14   CREATININE 1.24* 0.80  --  0.62  --  0.64 0.62 0.70  CALCIUM 8.4* 6.7*  --  7.4*  --  7.9* 8.6* 8.9  MG 1.8 2.2 2.1 1.9 1.7  --   --   --   PHOS 1.7* 2.5 2.2* 2.3* 2.4*  --   --   --     Liver Function Tests:  Recent Labs Lab 04/24/16 0750 04/24/16 1138  AST 29 39  ALT 19 20  ALKPHOS 47 49  BILITOT 0.7 0.5  PROT 7.2 7.5  ALBUMIN 4.2 4.3   No results for input(s): LIPASE, AMYLASE in the last 168 hours. No results for input(s): AMMONIA in the last 168 hours.  CBC:  Recent Labs Lab 04/24/16 0750 04/24/16 1138 04/25/16 0410 04/27/16 0505 04/29/16 0530 04/30/16 0508  WBC 11.6* 20.2* 11.0 6.3 6.0 7.6  NEUTROABS 8.4* 11.7*  --   --   --   --   HGB 13.5 13.3 10.8* 11.0* 11.6* 11.1*  HCT 38.7 38.6 31.7* 32.1* 34.0* 32.5*  MCV 82.1 85.3 82.8 84.6 83.5 83.0  PLT 226 269 203 143* 217 244    Cardiac Enzymes:  Recent Labs Lab 04/24/16 0750  TROPONINI <0.03    Lipid Panel: No results for input(s): CHOL, TRIG, HDL, CHOLHDL, VLDL,  [place] in the last 168 hours.  CBG:  Recent Labs Lab 04/29/16 0805 04/29/16 1204 04/29/16 1610 04/29/16 2119 04/30/16 0750  GLUCAP 160* 170* 241* 214* 171*    Microbiology: Results for orders placed or performed during the hospital encounter of 04/24/16  Blood culture (routine x 2)     Status: None   Collection Time: 04/24/16 11:38 AM  Result Value Ref Range Status   Specimen Description BLOOD RIGHT ASSIST CONTROL  Final   Special Requests   Final    BOTTLES DRAWN AEROBIC AND ANAEROBIC  AERO 20CC ANA [place]   Culture NO GROWTH 5 DAYS  Final   Report Status 04/29/2016 FINAL  Final  CSF culture     Status: None   Collection Time: 04/24/16 11:38 AM  Result Value Ref Range Status   Specimen Description CSF  Final   Special Requests Normal  Final   Gram Stain RARE WBC SEEN NO ORGANISMS SEEN   Final   Culture   Final    NO GROWTH 3 DAYS Performed at [facility]    Report Status 04/28/2016 FINAL  Final   Culture, blood (routine x 2)     Status: None   Collection Time: 04/24/16 11:38 AM  Result Value Ref Range Status   Specimen Description BLOOD RIGHT ASSIST CONTROL  Final   Special Requests   Final    BOTTLES DRAWN AEROBIC AND ANAEROBIC  AERO 10CC ANA [place]   Culture NO GROWTH 5 DAYS  Final   Report Status 04/29/2016 FINAL  Final  MRSA PCR Screening     Status: None   Collection Time: 04/24/16  3:20 PM  Result Value Ref Range Status   MRSA by PCR NEGATIVE NEGATIVE Final    Comment:        The GeneXpert MRSA Assay (FDA approved for NASAL specimens only), is one component of a comprehensive MRSA colonization surveillance program. It is not intended to diagnose MRSA infection nor to guide or monitor treatment for MRSA infections.   Urine culture     Status: None   Collection Time: 04/24/16  4:31 PM  Result Value Ref Range Status   Specimen Description URINE, CLEAN CATCH  Final   Special Requests Normal  Final   Culture NO GROWTH Performed at [facility]   Final   Report Status 04/25/2016 FINAL  Final  Culture, respiratory (NON-Expectorated)     Status: None   Collection Time: 04/24/16  4:31 PM  Result Value Ref Range Status   Specimen Description TRACHEAL ASPIRATE  Final   Special Requests NONE  Final   Gram Stain   Final    ABUNDANT WBC PRESENT,BOTH PMN AND MONONUCLEAR FEW GRAM POSITIVE COCCI IN PAIRS RARE GRAM VARIABLE ROD    Culture   Final    Consistent with normal respiratory flora. Performed at [facility]    Report Status 04/27/2016 FINAL  Final    Coagulation Studies: No results for input(s): LABPROT, INR in the last 72 hours.  Imaging: No results found.  Medications:  I have reviewed the patient's current medications. Scheduled: . acyclovir  800 mg Intravenous Q8H  . enoxaparin (LOVENOX) injection  40 mg Subcutaneous Q24H  . famotidine  20 mg Oral BID  . insulin aspart  0-15 Units Subcutaneous TID WC  . insulin aspart  0-5 Units  Subcutaneous QHS  . levETIRAcetam  500 mg Intravenous Q12H  . levothyroxine  100 mcg Oral QAC breakfast    Assessment/Plan: Today  is day 4 of 10 of Acyclovir. Will continue to follow with you.  Addressed husband'squestions.  Recommendations: 1.  Change Keppra to po, same dosing   LOS: 6 days   Alexis Goodell, MD Neurology [phone] 04/30/2016  11:10 AM

## 2016-04-30 NOTE — Progress Notes (Signed)
To 117  From ccu alert. Slow speech at times but responds . Family at bedside. No distress

## 2016-04-30 NOTE — Progress Notes (Signed)
Rehab Admissions Coordinator Note:  Patient was screened by Cleatrice Burke for appropriateness for an Inpatient Acute Rehab Consult per OT recommendation. Noted RN CM pursuing AIR in [place] due to family preference. PT recommended HH yesterday with pt min guard assist 225 feet with RW.  At this time, we are recommending await [facility] assessment for possible AIR admit.. I will not follow at this time.  Cleatrice Burke 04/30/2016, 2:11 PM  I can be reached at [phone].

## 2016-04-30 NOTE — Care Management (Signed)
Attempted to speak with patients husband regarding discharge and he ask that I call and speak with son...Dr. Meryl Crutch [phone]).  TC to Dr.Butters. Discussed PT recommendations. He is requesting acute inpatient rehab at [facility] where he is a psychiatrist.  Explained typical requirements for AIR. PT ask to reevaluate and OT ordered. Further gave explanation as to why at this point she did not qualify for STR.  TC to [facility] [phone]), left message with intake for return call. Son pleasant and RNCM informed him that he would get a return call once I knew more.

## 2016-04-30 NOTE — Progress Notes (Signed)
[place] for electrolyte management   No Known Allergies  Patient Measurements: Height: 5\' 3"  (160 cm) Weight: 183 lb 10.3 oz (83.3 kg) IBW/kg (Calculated) : 52.4   Vital Signs: Temp: 98.9 F (37.2 C) (06/28 0600) BP: 158/79 mmHg (06/28 0800) Pulse Rate: 85 (06/28 0800) Intake/Output from previous day: 06/27 0701 - 06/28 0700 In: 2130 [P.O.:480; I.V.:1650] Out: 950 [Urine:950] Intake/Output from this shift: Total I/O In: -  Out: 1100 [Urine:1100]  Labs:  Recent Labs  04/29/16 0530 04/30/16 0508  WBC 6.0 7.6  HGB 11.6* 11.1*  HCT 34.0* 32.5*  PLT 217 244  CREATININE 0.62 0.70   Estimated Creatinine Clearance: 67.9 mL/min (by C-G formula based on Cr of 0.7).   Assessment: Pharmacy consulted for electrolyte management for 70 yo female.   Plan:  No replacement warranted at this time.   Will recheck electrolytes with am labs on 6/30.    Pharmacy will continue to monitor and adjust per consult.    Simpson,Michael L 04/30/2016,2:52 PM

## 2016-05-01 LAB — BASIC METABOLIC PANEL
Anion gap: 7 (ref 5–15)
BUN: 12 mg/dL (ref 6–20)
CHLORIDE: 106 mmol/L (ref 101–111)
CO2: 25 mmol/L (ref 22–32)
Calcium: 9 mg/dL (ref 8.9–10.3)
Creatinine, Ser: 0.65 mg/dL (ref 0.44–1.00)
GFR calc Af Amer: >60 mL/min (ref >60)
GFR calc non Af Amer: >60 mL/min (ref >60)
GLUCOSE: 187 mg/dL — AB (ref 65–99)
POTASSIUM: 3.9 mmol/L (ref 3.5–5.1)
Sodium: 138 mmol/L (ref 135–145)

## 2016-05-01 LAB — GLUCOSE, CAPILLARY
GLUCOSE-CAPILLARY: 298 mg/dL — AB (ref 65–99)
Glucose-Capillary: 237 mg/dL — ABNORMAL HIGH (ref 65–99)
Glucose-Capillary: 230 mg/dL — ABNORMAL HIGH (ref 65–99)
Glucose-Capillary: 267 mg/dL — ABNORMAL HIGH (ref 65–99)

## 2016-05-01 LAB — CBC
HEMATOCRIT: 32.6 % — AB (ref 35.0–47.0)
HEMOGLOBIN: 11.1 g/dL — AB (ref 12.0–16.0)
MCH: 28.4 pg (ref 26.0–34.0)
MCHC: 34.1 g/dL (ref 32.0–36.0)
MCV: 83.2 fL (ref 80.0–100.0)
Platelets: 246 10*3/uL (ref 150–440)
RBC: 3.92 MIL/uL (ref 3.80–5.20)
RDW: 14.5 % (ref 11.5–14.5)
WBC: 10.2 10*3/uL (ref 3.6–11.0)

## 2016-05-01 LAB — HEMOGLOBIN A1C: HEMOGLOBIN A1C: 7.1 % — AB (ref 4.0–6.0)

## 2016-05-01 MED ORDER — INSULIN GLARGINE 100 UNIT/ML ~~LOC~~ SOLN
10.0000 [IU] | Freq: Every day | SUBCUTANEOUS | Status: DC
  Administered 2016-05-01: 22:00:00 10 [IU] via SUBCUTANEOUS
  Filled 2016-05-01 (×2): qty 0.1

## 2016-05-01 NOTE — Progress Notes (Signed)
Occupational Therapy Treatment Patient Details Name: SION BEEKER MRN: [id number] DOB: [birth year] Today's Date: 05/01/2016    History of present illness 70 yo F presented to ED for AMS on 6/22. She was admitte for seizures and encephalitis with possibility of viral menengitis. She was intubated and sedated 6/22 and extubated and taken off sedation 6/24.    OT comments  Pt is making progress with automatic tasks with grooming sitting at EOB with improved balance and initiation os tasks.  She continues to perseverate on July 1 and was not able to state her wedding anniversary, which her husband kept asking her.  Provided a lot of educaiton to husband about need for repetition.  Son [name] present and assisted with understanding.  Worked on date and she was able to read date on board.  Patient needs format for questions in choice format or yes/no to be successful.  Her wedding anniversary is Dec 26 and they have been married 41 years.  She recalled she had been married for 40 years.  Unable to change focus of date to working on clock and wrote words on clock "Loudi" which family indicated did not mean anything in Arabic.  She was not able to write numbers on clock today.  She was able to identify camel and hippo by name but not lion.  She has better recall with numbers and letters. She uses reading glasses. She was able to comb hair today without cues and initiated looking into mirror when sitting EOB.  Decreased initiation and sequencing during bed mobility to sit up at EOB and needed min assist and mod cues but only supervision when lying back down in bed.  Pt has been approved for CIR with plan to dc tomorrow.      Follow Up Recommendations  CIR    Equipment Recommendations       Recommendations for Other Services      Precautions / Restrictions Precautions Precautions: Fall Restrictions Weight Bearing Restrictions: No       Mobility Bed Mobility                  Transfers                      Balance                                   ADL Overall ADL's : Needs assistance/impaired     Grooming: Wash/dry hands;Wash/dry face;Brushing hair Grooming Details (indicate cue type and reason): supervised with automatic response to know how to use comb and actively looked into mirror while sitting EOB                               General ADL Comments: Pt continues to perseverate on July 1 and was not able to state her wedding anniversary.  Provided a lot of educaiton to husband about need for repetition.  Son [name] present and assisted with understanding.  Worked on date and she wasa able to read date on board.  Patient needs format for questions in choice format or yes/no to be successful.  Her wedding anniversary is Dec 26 and they have been married 41 years.  She recalled she had been married for 40 years.  Unable to change focus of date to working on clock and wrote words on clock "Loudi"  which family indicated did not mean anything in Arabic.  She was not able to write numbers on clock today.  She was able to identify camel and hippo by name but not lion.  She has better recall with numbers and letters.       Vision                     Perception     Praxis      Cognition   Behavior During Therapy: [facility] for tasks assessed/performed Overall Cognitive Status: Impaired/Different from baseline Area of Impairment: Orientation;Memory;Attention;Following commands;Problem solving Orientation Level: Disoriented to;Place;Time;Situation Current Attention Level: Alternating Memory: Decreased short-term memory  Following Commands: Follows one step commands inconsistently            Extremity/Trunk Assessment               Exercises     Shoulder Instructions       General Comments      Pertinent Vitals/ Pain          Home Living                                          Prior  Functioning/Environment              Frequency       Progress Toward Goals  OT Goals(current goals can now be found in the care plan section)  Progress towards OT goals: Progressing toward goals  Acute Rehab OT Goals Patient Stated Goal: to go home OT Goal Formulation: With patient/family Time For Goal Achievement: 04/14/16 Potential to Achieve Goals: Good  Plan Discharge plan remains appropriate    Co-evaluation                 End of Session     Activity Tolerance Patient tolerated treatment well   Patient Left in bed;with bed alarm set;with family/visitor present;with call bell/phone within reach   Nurse Communication          Time: 1448-1530 OT Time Calculation (min): 42 min  Charges: OT General Charges $OT Visit: 1 Procedure OT Treatments $Self Care/Home Management : 8-22 mins $Therapeutic Activity: 23-37 mins   Chrys Racer, OTR/L ascom [phone] 05/01/2016, 4:10 PM

## 2016-05-01 NOTE — Progress Notes (Signed)
Pharmacy Antibiotic Note  Cathy Harrington is a 70 y.o. female admitted on 04/24/2016 with meningitis.  Pharmacy has been consulted for acyclovir dosing for viral encephalopathy   Plan: Will continue Acyclovir 800mg  IV Q8hr. Per ID MD continue therapy for 14 days. Today is Day 8/14.  Renal function stable.    Height: 5\' 3"  (160 cm) Weight: 183 lb 8 oz (83.235 kg) IBW/kg (Calculated) : 52.4  Temp (24hrs), Avg:98.1 F (36.7 C), Min:97.8 F (36.6 C), Max:98.4 F (36.9 C)   Recent Labs Lab 04/24/16 1138 04/24/16 1405 04/25/16 0410 04/25/16 1100 04/25/16 1610 04/26/16 0500 04/27/16 0505 04/27/16 1752 04/29/16 0530 04/30/16 0508 05/01/16 0432  WBC 20.2*  --  11.0  --   --   --  6.3  --  6.0 7.6 10.2  CREATININE 1.24*  --  0.80  --   --  0.62 0.64  --  0.62 0.70 0.65  LATICACIDVEN 1.4 1.4  --  0.9 0.7  --   --   --   --   --   --   VANCOTROUGH  --   --   --   --   --   --   --  7*  --   --   --     Estimated Creatinine Clearance: 67.8 mL/min (by C-G formula based on Cr of 0.65).    No Known Allergies  Antimicrobials this admission: Acyclovir 6/22 >>  Ampicillin 6/22 >> 6/23 Ceftriaxone 6/22 >> 6/23 Vancomycin 6/22 >> 6/26  Dose adjustments this admission: 6/23 Acyclovir transitioned to Q8hr dosing.   Microbiology results: 6/22 BCx: no growth   6/22 UCx: no growth  6/22 Sputum: normal flora 6/22 MRSA PCR: negative   6/22 CSF: no growth   Pharmacy will continue to monitor and adjust per consult.    Rocky Morel 05/01/2016 8:59 AM

## 2016-05-01 NOTE — Progress Notes (Signed)
Inpatient Diabetes Program Recommendations  AACE/ADA: New Consensus Statement on Inpatient Glycemic Control (2015)  Target Ranges:  Prepandial:   less than 140 mg/dL      Peak postprandial:   less than 180 mg/dL (1-2 hours)      Critically ill patients:  140 - 180 mg/dL   Lab Results  Component Value Date   GLUCAP 237* 05/01/2016    Review of Glycemic Control  Results for ZURRI, OBRYAN (MRN [id number]) as of 05/01/2016 09:28  Ref. Range 04/29/2016 12:04 04/29/2016 16:10 04/29/2016 21:19 04/30/2016 07:50 04/30/2016 11:40 04/30/2016 17:29 04/30/2016 20:15 05/01/2016 07:36  Glucose-Capillary Latest Ref Range: 65-99 mg/dL 170 (H) 241 (H) 214 (H) 171 (H) 258 (H) 284 (H) 306 (H) 237 (H)    Diabetes history: Type 2 Outpatient Diabetes medications: Metformin 1000mg  bid Current orders for Inpatient glycemic control: Novolog 0-15 units tid, Novolog 0-5 units qhs  Inpatient Diabetes Program Recommendations: blood sugars remain elevated.  Consider starting patient on Lantus 17 units (0.2 units/kg) qday starting now.   Gentry Fitz, RN, BA, MHA, CDE Diabetes Coordinator Inpatient Diabetes Program  [phone] (Team Pager) [phone] ([place]) 05/01/2016 9:31 AM

## 2016-05-01 NOTE — Progress Notes (Signed)
Subjective: Patient sitting up in bed.  Has been speaking with family members over the phone.  Objective: Current vital signs: BP 153/63 mmHg  Pulse 78  Temp(Src) 97.8 F (36.6 C) (Oral)  Resp 20  Ht 5\' 3"  (1.6 m)  Wt 83.235 kg (183 lb 8 oz)  BMI 32.51 kg/m2  SpO2 99% Vital signs in last 24 hours: Temp:  [97.8 F (36.6 C)-98.4 F (36.9 C)] 97.8 F (36.6 C) (06/29 0515) Pulse Rate:  [78-103] 78 (06/29 0515) Resp:  [20] 20 (06/28 1559) BP: (153-165)/(63-85) 153/63 mmHg (06/29 0515) SpO2:  [97 %-99 %] 99 % (06/29 0515) Weight:  [83.235 kg (183 lb 8 oz)-83.462 kg (184 lb)] 83.235 kg (183 lb 8 oz) (06/29 0515)  Intake/Output from previous day: 06/28 0701 - 06/29 0700 In: 1120 [P.O.:1020; I.V.:100] Out: 2500 [Urine:2500] Intake/Output this shift: Total I/O In: 240 [P.O.:240] Out: -  Nutritional status: DIET DYS 2 Room service appropriate?: Yes with Assist; Fluid consistency:: Thin  Neurologic Exam: Mental Status: Alert and awake. Remains unable to tell me her middle name. Follows commands to stick out her tongue and shows me her thumb to command but still will not put up two fingers to command in [place] or Arabic.    Cranial Nerves: II: Discs flat bilaterally; Pupils equal, round, reactive to light and accommodation III,IV, VI: ptosis not present, extra-ocular motions intact bilaterally V,VII: no facial asymmetry noted.  VIII: hearing normal bilaterally IX,X: gag reflex present XI: unable to test XII: unable to test Motor: Moves all extremities against gravity with no focal weakness noted.  Sensory: Appreciates tactile stimuli in all extremities  Lab Results: Basic Metabolic Panel:  Recent Labs Lab 04/25/16 0410 04/25/16 1415 04/26/16 0500 04/26/16 1724 04/27/16 0505 04/29/16 0530 04/30/16 0508 05/01/16 0432  NA 132*  --  135  --  136 139 138 138  K 3.5  --  3.2*  --  3.6 3.5 3.7 3.9  CL 104  --  104  --  102 106 107 106  CO2 23  --  25  --  27 25  24 25   GLUCOSE 131*  --  173*  --  111* 160* 202* 187*  BUN 10  --  10  --  7 10 14 12   CREATININE 0.80  --  0.62  --  0.64 0.62 0.70 0.65  CALCIUM 6.7*  --  7.4*  --  7.9* 8.6* 8.9 9.0  MG 2.2 2.1 1.9 1.7  --   --   --   --   PHOS 2.5 2.2* 2.3* 2.4*  --   --   --   --     Liver Function Tests: No results for input(s): AST, ALT, ALKPHOS, BILITOT, PROT, ALBUMIN in the last 168 hours. No results for input(s): LIPASE, AMYLASE in the last 168 hours. No results for input(s): AMMONIA in the last 168 hours.  CBC:  Recent Labs Lab 04/25/16 0410 04/27/16 0505 04/29/16 0530 04/30/16 0508 05/01/16 0432  WBC 11.0 6.3 6.0 7.6 10.2  HGB 10.8* 11.0* 11.6* 11.1* 11.1*  HCT 31.7* 32.1* 34.0* 32.5* 32.6*  MCV 82.8 84.6 83.5 83.0 83.2  PLT 203 143* 217 244 246    Cardiac Enzymes: No results for input(s): CKTOTAL, CKMB, CKMBINDEX, TROPONINI in the last 168 hours.  Lipid Panel: No results for input(s): CHOL, TRIG, HDL, CHOLHDL, VLDL, LDLCALC in the last 168 hours.  CBG:  Recent Labs Lab 04/30/16 1140 04/30/16 1729 04/30/16 2015 05/01/16 0736 05/01/16 1123  GLUCAP 258* 284* 306* 237* 68*    Microbiology: Results for orders placed or performed during the hospital encounter of 04/24/16  Blood culture (routine x 2)     Status: None   Collection Time: 04/24/16 11:38 AM  Result Value Ref Range Status   Specimen Description BLOOD RIGHT ASSIST CONTROL  Final   Special Requests   Final    BOTTLES DRAWN AEROBIC AND ANAEROBIC  AERO 20CC ANA [place]   Culture NO GROWTH 5 DAYS  Final   Report Status 04/29/2016 FINAL  Final  CSF culture     Status: None   Collection Time: 04/24/16 11:38 AM  Result Value Ref Range Status   Specimen Description CSF  Final   Special Requests Normal  Final   Gram Stain RARE WBC SEEN NO ORGANISMS SEEN   Final   Culture   Final    NO GROWTH 3 DAYS Performed at [facility]    Report Status 04/28/2016 FINAL  Final  Culture, blood (routine x 2)      Status: None   Collection Time: 04/24/16 11:38 AM  Result Value Ref Range Status   Specimen Description BLOOD RIGHT ASSIST CONTROL  Final   Special Requests   Final    BOTTLES DRAWN AEROBIC AND ANAEROBIC  AERO 10CC ANA [place]   Culture NO GROWTH 5 DAYS  Final   Report Status 04/29/2016 FINAL  Final  MRSA PCR Screening     Status: None   Collection Time: 04/24/16  3:20 PM  Result Value Ref Range Status   MRSA by PCR NEGATIVE NEGATIVE Final    Comment:        The GeneXpert MRSA Assay (FDA approved for NASAL specimens only), is one component of a comprehensive MRSA colonization surveillance program. It is not intended to diagnose MRSA infection nor to guide or monitor treatment for MRSA infections.   Urine culture     Status: None   Collection Time: 04/24/16  4:31 PM  Result Value Ref Range Status   Specimen Description URINE, CLEAN CATCH  Final   Special Requests Normal  Final   Culture NO GROWTH Performed at [facility]   Final   Report Status 04/25/2016 FINAL  Final  Culture, respiratory (NON-Expectorated)     Status: None   Collection Time: 04/24/16  4:31 PM  Result Value Ref Range Status   Specimen Description TRACHEAL ASPIRATE  Final   Special Requests NONE  Final   Gram Stain   Final    ABUNDANT WBC PRESENT,BOTH PMN AND MONONUCLEAR FEW GRAM POSITIVE COCCI IN PAIRS RARE GRAM VARIABLE ROD    Culture   Final    Consistent with normal respiratory flora. Performed at [facility]    Report Status 04/27/2016 FINAL  Final    Coagulation Studies: No results for input(s): LABPROT, INR in the last 72 hours.  Imaging: No results found.  Medications:  I have reviewed the patient's current medications. Scheduled: . acyclovir  800 mg Intravenous Q8H  . enoxaparin (LOVENOX) injection  40 mg Subcutaneous Q24H  . famotidine  20 mg Oral BID  . insulin aspart  0-15 Units Subcutaneous TID WC  . insulin aspart  0-5 Units Subcutaneous QHS  . levETIRAcetam   500 mg Oral BID  . levothyroxine  100 mcg Oral QAC breakfast    Assessment/Plan: Patient remains stable.  Day 5 of 10 Acyclovir.  Agree with current management.   LOS: 7 days   Alexis Goodell, MD  Neurology [phone] 05/01/2016  12:18 PM

## 2016-05-01 NOTE — Care Management (Signed)
Occupational therapy, physical therapy, speech therapy, and progress notes faxed to [facility] In [place] as requested. Shelbie Ammons RN MSN CCM Care Management [phone]

## 2016-05-01 NOTE — Care Management Important Message (Signed)
Important Message  Patient Details  Name: Cathy Harrington MRN: [id number] Date of Birth: Jan 17, 1946   Medicare Important Message Given:  Yes    Juliann Pulse A Itzayana Pardy 05/01/2016, 10:40 AM

## 2016-05-01 NOTE — Progress Notes (Signed)
Physical Therapy Treatment Patient Details Name: ARIBELLE RACE MRN: [id number] DOB: [birth year] Today's Date: 05/01/2016    History of Present Illness 70 yo F presented to ED for AMS on 6/22. She was admitte for seizures and encephalitis with possibility of viral menengitis. She was intubated and sedated 6/22 and extubated and taken off sedation 6/24.     PT Comments    Pt continues to progress in functional mobility. She was able to follow commands better this session. She ambulated 275 ft with FWW 2x with min guard. Navigated steps with B rails with min guard. No LOB or buckling during mobility. Plan to progress strengthening and mobility as tolerated.  Follow Up Recommendations  Home health PT;Supervision/Assistance - 24 hour     Equipment Recommendations  Rolling walker with 5" wheels    Recommendations for Other Services       Precautions / Restrictions Precautions Precautions: Fall Restrictions Weight Bearing Restrictions: No    Mobility  Bed Mobility Overal bed mobility: Modified Independent Bed Mobility: Supine to Sit;Sit to Supine     Supine to sit: Modified independent (Device/Increase time) Sit to supine: Modified independent (Device/Increase time)      Transfers Overall transfer level: Needs assistance Equipment used: Rolling walker (2 wheeled) Transfers: Sit to/from [facility] Sit to Stand: Min guard Stand pivot transfers: Min guard       General transfer comment: steady with no LOB  Ambulation/Gait Ambulation/Gait assistance: Min guard Ambulation Distance (Feet): 275 Feet Assistive device: Rolling walker (2 wheeled) Gait Pattern/deviations: Decreased stride length;Trunk flexed Gait velocity: reduced Gait velocity interpretation: Below normal speed for age/gender General Gait Details: Fair speed and steady gait with occasional cues for safety. Cues for staying close to [facility] and navigation. No LOB.   Stairs Stairs: Yes Stairs  assistance: Min guard Stair Management: Two rails Number of Stairs: 4 General stair comments: steady with no LOB  Wheelchair Mobility    Modified Rankin (Stroke Patients Only)       Balance Overall balance assessment: Needs assistance Sitting-balance support: No upper extremity supported Sitting balance-Leahy Scale: Good     Standing balance support: No upper extremity supported Standing balance-Leahy Scale: Fair Standing balance comment: maintains with no LOB                    Cognition Arousal/Alertness: Awake/alert Behavior During Therapy: WFL for tasks assessed/performed Overall Cognitive Status: Impaired/Different from baseline Area of Impairment: Orientation;Memory;Attention;Following commands;Problem solving Orientation Level: Disoriented to;Place;Time;Situation Current Attention Level: Alternating Memory: Decreased short-term memory Following Commands: Follows one step commands inconsistently       General Comments: Pt able to follow most simple commands. Visual cues helps.    Exercises Other Exercises Other Exercises: Pt ambulated 275 ft x 2 with FWW and min guard. She demonstrated fair speed and good stability with no LOB or buckling. Seated rest break between for energy conservation. Cues for staying close to [facility] and navigation. Pt able to follow verbal directional commands. Other Exercises: Up/ down 4 steps with B rails and min guard. Steady with no LOB.    General Comments        Pertinent Vitals/Pain Pain Assessment: No/denies pain    Home Living                      Prior Function            PT Goals (current goals can now be found in the care plan section) Acute  Rehab PT Goals Patient Stated Goal: to go home PT Goal Formulation: With patient/family Time For Goal Achievement: 05/13/16 Potential to Achieve Goals: Fair Progress towards PT goals: Progressing toward goals    Frequency  Min 2X/week    PT Plan Current plan  remains appropriate    Co-evaluation             End of Session Equipment Utilized During Treatment: Gait belt Activity Tolerance: Patient tolerated treatment well Patient left: in bed;with call bell/phone within reach;with bed alarm set;with family/visitor present     Time: 1537-1601 PT Time Calculation (min) (ACUTE ONLY): 24 min  Charges:  $Gait Training: 23-37 mins                    G Codes:      Neoma Laming, PT, DPT  05/01/2016, 4:13 PM [phone]

## 2016-05-01 NOTE — Care Management (Signed)
Received call late yesterday from [facility] that patient was denied admission. RNCM updated on 1C

## 2016-05-01 NOTE — Care Management (Addendum)
Received call back from [place] at [facility] In [place]. Will be able to accept Ms. Cathy Harrington tomorrow morning at their facility. Discussed with  Dr. Leslye Peer. Informed that the discharge summary and emtala form will need to be completed early in the AM. Discussed 9:00am-10:00am pick-up for 05/02/16 with [facility]. Faxed face sheet to [facility].  Accepting physician is Dr. Dina Rich. Will be going to room 7341. Will update family and nursing staff.  Will update Minisha at [facility]. Shelbie Ammons RN MSN CCM Care Management [phone]

## 2016-05-01 NOTE — Progress Notes (Signed)
Patient ID: Cathy Harrington, female   DOB: April 09, 1946, 70 y.o.   MRN: [id number] [employer] PROGRESS NOTE  GERALDINE ALLAIN [id number] DOB: Jul 20, 1946 DOA: 04/24/2016 PCP: No primary care provider on file.  HPI/Subjective: Patient kept on repeating me. She offered no complaints.  Objective: Filed Vitals:   05/01/16 0515 05/01/16 1424  BP: 153/63 135/85  Pulse: 78 94  Temp: 97.8 F (36.6 C) 97.5 F (36.4 C)  Resp:  16    Filed Weights   04/30/16 0403 04/30/16 1559 05/01/16 0515  Weight: 83.3 kg (183 lb 10.3 oz) 83.462 kg (184 lb) 83.235 kg (183 lb 8 oz)    ROS: Review of Systems  Constitutional: Negative for fever and chills.  Eyes: Negative for blurred vision.  Respiratory: Negative for cough and shortness of breath.   Cardiovascular: Negative for chest pain.  Gastrointestinal: Negative for nausea, vomiting, abdominal pain, diarrhea and constipation.  Genitourinary: Negative for dysuria.  Musculoskeletal: Negative for joint pain.  Neurological: Negative for dizziness and headaches.   Exam: Physical Exam  HENT:  Nose: No mucosal edema.  Mouth/Throat: No oropharyngeal exudate or posterior oropharyngeal edema.  Eyes: Conjunctivae, EOM and lids are normal. Pupils are equal, round, and reactive to light.  Neck: No JVD present. Carotid bruit is not present. No edema present. No thyroid mass and no thyromegaly present.  Cardiovascular: S1 normal and S2 normal.  Exam reveals no gallop.   Murmur heard.  Systolic murmur is present with a grade of 2/6  Pulses:      Dorsalis pedis pulses are 2+ on the right side, and 2+ on the left side.  Respiratory: No respiratory distress. She has no wheezes. She has no rhonchi. She has no rales.  GI: Soft. Bowel sounds are normal. There is no tenderness.  Musculoskeletal:       Right ankle: She exhibits no swelling.       Left ankle: She exhibits no swelling.  Lymphadenopathy:    She has no cervical adenopathy.  Neurological: She is  alert. No cranial nerve deficit.  Skin: Skin is warm. No rash noted. Nails show no clubbing.  Psychiatric: She has a normal mood and affect.      Data Reviewed: Basic Metabolic Panel:  Recent Labs Lab 04/25/16 0410 04/25/16 1415 04/26/16 0500 04/26/16 1724 04/27/16 0505 04/29/16 0530 04/30/16 0508 05/01/16 0432  NA 132*  --  135  --  136 139 138 138  K 3.5  --  3.2*  --  3.6 3.5 3.7 3.9  CL 104  --  104  --  102 106 107 106  CO2 23  --  25  --  27 25 24 25   GLUCOSE 131*  --  173*  --  111* 160* 202* 187*  BUN 10  --  10  --  7 10 14 12   CREATININE 0.80  --  0.62  --  0.64 0.62 0.70 0.65  CALCIUM 6.7*  --  7.4*  --  7.9* 8.6* 8.9 9.0  MG 2.2 2.1 1.9 1.7  --   --   --   --   PHOS 2.5 2.2* 2.3* 2.4*  --   --   --   --    CBC:  Recent Labs Lab 04/25/16 0410 04/27/16 0505 04/29/16 0530 04/30/16 0508 05/01/16 0432  WBC 11.0 6.3 6.0 7.6 10.2  HGB 10.8* 11.0* 11.6* 11.1* 11.1*  HCT 31.7* 32.1* 34.0* 32.5* 32.6*  MCV 82.8 84.6 83.5 83.0 83.2  PLT  203 143* 217 244 246    CBG:  Recent Labs Lab 04/30/16 1140 04/30/16 1729 04/30/16 2015 05/01/16 0736 05/01/16 1123  GLUCAP 258* 284* 306* 237* 230*    Recent Results (from the past 240 hour(s))  Blood culture (routine x 2)     Status: None   Collection Time: 04/24/16 11:38 AM  Result Value Ref Range Status   Specimen Description BLOOD RIGHT ASSIST CONTROL  Final   Special Requests   Final    BOTTLES DRAWN AEROBIC AND ANAEROBIC  AERO 20CC ANA [place]   Culture NO GROWTH 5 DAYS  Final   Report Status 04/29/2016 FINAL  Final  CSF culture     Status: None   Collection Time: 04/24/16 11:38 AM  Result Value Ref Range Status   Specimen Description CSF  Final   Special Requests Normal  Final   Gram Stain RARE WBC SEEN NO ORGANISMS SEEN   Final   Culture   Final    NO GROWTH 3 DAYS Performed at [facility]    Report Status 04/28/2016 FINAL  Final  Culture, blood (routine x 2)     Status: None   Collection  Time: 04/24/16 11:38 AM  Result Value Ref Range Status   Specimen Description BLOOD RIGHT ASSIST CONTROL  Final   Special Requests   Final    BOTTLES DRAWN AEROBIC AND ANAEROBIC  AERO 10CC ANA [place]   Culture NO GROWTH 5 DAYS  Final   Report Status 04/29/2016 FINAL  Final  MRSA PCR Screening     Status: None   Collection Time: 04/24/16  3:20 PM  Result Value Ref Range Status   MRSA by PCR NEGATIVE NEGATIVE Final    Comment:        The GeneXpert MRSA Assay (FDA approved for NASAL specimens only), is one component of a comprehensive MRSA colonization surveillance program. It is not intended to diagnose MRSA infection nor to guide or monitor treatment for MRSA infections.   Urine culture     Status: None   Collection Time: 04/24/16  4:31 PM  Result Value Ref Range Status   Specimen Description URINE, CLEAN CATCH  Final   Special Requests Normal  Final   Culture NO GROWTH Performed at [facility]   Final   Report Status 04/25/2016 FINAL  Final  Culture, respiratory (NON-Expectorated)     Status: None   Collection Time: 04/24/16  4:31 PM  Result Value Ref Range Status   Specimen Description TRACHEAL ASPIRATE  Final   Special Requests NONE  Final   Gram Stain   Final    ABUNDANT WBC PRESENT,BOTH PMN AND MONONUCLEAR FEW GRAM POSITIVE COCCI IN PAIRS RARE GRAM VARIABLE ROD    Culture   Final    Consistent with normal respiratory flora. Performed at [facility]    Report Status 04/27/2016 FINAL  Final      Scheduled Meds: . acyclovir  800 mg Intravenous Q8H  . enoxaparin (LOVENOX) injection  40 mg Subcutaneous Q24H  . famotidine  20 mg Oral BID  . insulin aspart  0-15 Units Subcutaneous TID WC  . insulin aspart  0-5 Units Subcutaneous QHS  . insulin glargine  10 Units Subcutaneous QHS  . levETIRAcetam  500 mg Oral BID  . levothyroxine  100 mcg Oral QAC breakfast   Continuous Infusions: . sodium chloride 50 mL/hr at 04/30/16 1400     Assessment/Plan:  1. Viral encephalopathy. As per neuro total of 10  days of IV acyclovir must be given. 2. Seizure. Patient on oral Keppra. 3. Acute hypoxemic and hypercarbic respiratory failure. The patient was extubated 04/26/2016. Breathing comfortably at this point. 4. Hypothyroidism on Synthroid 5. Acute renal failure hyponatremia and hypokalemia. All these things have improved. 6. Type 2 diabetes mellitus start low-dose Lantus and continue sliding scale. Hemoglobin A1c 7.1. Potentially can go back on metformin as outpatient.  Code Status:     Code Status Orders        Start     Ordered   04/24/16 1231  Full code   Continuous     04/24/16 1233    Code Status History    Date Active Date Inactive Code Status Order ID Comments User Context   This patient has a current code status but no historical code status.     Disposition Plan: As per care manager the patient was accepted over to [facility] acute rehabilitation  Consultants:  Critical care specialist  Neurology  Infectious disease  Antibiotics:  IV acyclovir  Time spent: 25 minutes  [place], [place]

## 2016-05-02 ENCOUNTER — Ambulatory Visit (HOSPITAL_COMMUNITY)
Admission: AD | Admit: 2016-05-02 | Discharge: 2016-05-02 | Disposition: A | Discharge status: Home / Self Care | Payer: Medicare Other | Source: Other Acute Inpatient Hospital | Attending: Internal Medicine | Admitting: Internal Medicine

## 2016-05-02 DIAGNOSIS — R569 Unspecified convulsions: Secondary | ICD-10-CM | POA: Insufficient documentation

## 2016-05-02 LAB — GLUCOSE, CAPILLARY: GLUCOSE-CAPILLARY: 229 mg/dL — AB (ref 65–99)

## 2016-05-02 MED ORDER — INSULIN ASPART 100 UNIT/ML ~~LOC~~ SOLN: 4.0000 [IU] | Freq: Three times a day (TID) | SUBCUTANEOUS | Status: DC

## 2016-05-02 MED ORDER — LEVETIRACETAM 500 MG PO TABS: 500.0000 mg | ORAL_TABLET | Freq: Two times a day (BID) | ORAL | Status: DC

## 2016-05-02 MED ORDER — INSULIN GLARGINE 100 UNIT/ML ~~LOC~~ SOLN: 10.0000 [IU] | Freq: Every day | SUBCUTANEOUS | Status: DC

## 2016-05-02 MED ORDER — ENOXAPARIN SODIUM 40 MG/0.4ML ~~LOC~~ SOLN: 40.0000 mg | SUBCUTANEOUS | Status: DC

## 2016-05-02 MED ORDER — DEXTROSE 5 % IV SOLN: 800.0000 mg | Freq: Three times a day (TID) | INTRAVENOUS | Status: DC

## 2016-05-02 NOTE — Progress Notes (Signed)
Pt ready for transport to [place] no resp distress denies pain.  Bath given  And tranfer gown placed.report given to [facility] at  [place] here to transport at this time.

## 2016-05-02 NOTE — Care Management (Addendum)
Message left at [facility] acute inpatient rehab [phone] to coordinate care for this patient that [facility] is picking up between 9A and 10A. Received call from [facility] with [facility] inpatient acute rehab [phone]. RN to call report to [phone]. [facility] expected at 0930. No further RNCM needs.

## 2016-05-02 NOTE — Discharge Summary (Signed)
[place] [facility] NAME: Cathy Harrington    MR#:  [id number]  DATE OF BIRTH:  Nov 19, 1945  DATE OF ADMISSION:  04/24/2016 ADMITTING PHYSICIAN: Hillary Bow, MD  DATE OF DISCHARGE: 05/02/2016  PRIMARY CARE PHYSICIAN: No primary care provider on file.    ADMISSION DIAGNOSIS:  Seizure ([place]) [R56.9] Fever, unspecified fever cause [R50.9] Altered mental status, unspecified altered mental status type [R41.82]  DISCHARGE DIAGNOSIS:  Active Problems:   Seizures (HCC)   Acute respiratory failure (HCC)   Altered mental status   Pyrexia   Seizure (HCC)   Encephalitis   SECONDARY DIAGNOSIS:   Past Medical History  Diagnosis Date  . Thyroid disease   . Diabetes mellitus without complication ([place])   . Hypothyroidism     HOSPITAL COURSE:   1. Viral encephalopathy. Patient had a lumbar puncture on 04/24/2016 which showed a predominance of lymphocytes and also monocytes in the CSF. Patient initially was started on antibiotics and they were stopped once cultures were negative. Patient was started on acyclovir IV. Patient will receive a total of 10 days of IV acyclovir through a central line. A prescription was written for 5 more days (through July 4th evening dose). Once the eye cycle beer course is finished the central line can be removed. 2. Seizure. Patient was started on Keppra by neurology. 3. Acute hypoxic and hypercarbic respiratory failure. The patient was on the ventilator on the critical care service and was extubated on 04/26/2016. Patient is breathing comfortably on room air at this point. 4. Hypothyroidism unspecified on Synthroid 5. Acute renal failure, hyponatremia and hypokalemia. All these things have improved. 6. Type 2 diabetes mellitus start low-dose Lantus and short acting insulin prior to meals. Can also go back on metformin as outpatient. Continue to monitor sugars every before meals and daily at bedtime. Hemoglobin A1c  7.1. 7. DVT prophylaxis with Lovenox until more ambulatory  DISCHARGE CONDITIONS:   Fair  CONSULTS OBTAINED:  Treatment Team:  Catarina Hartshorn, MD Leonel Ramsay, MD  DRUG ALLERGIES:  No Known Allergies  DISCHARGE MEDICATIONS:   Current Discharge Medication List    START taking these medications   Details  acyclovir 800 mg in dextrose 5 % 150 mL Inject 800 mg into the vein every 8 (eight) hours. Qty: 15 Dose, Refills: 0    enoxaparin (LOVENOX) 40 MG/0.4ML injection Inject 0.4 mLs (40 mg total) into the skin daily. Qty: 0 Syringe    insulin aspart (NOVOLOG) 100 UNIT/ML injection Inject 4 Units into the skin 3 (three) times daily with meals. Qty: 10 mL, Refills: 0    insulin glargine (LANTUS) 100 UNIT/ML injection Inject 0.1 mLs (10 Units total) into the skin at bedtime. Qty: 10 mL, Refills: 0    levETIRAcetam (KEPPRA) 500 MG tablet Take 1 tablet (500 mg total) by mouth 2 (two) times daily. Qty: 60 tablet, Refills: 0      CONTINUE these medications which have NOT CHANGED   Details  levothyroxine (SYNTHROID, LEVOTHROID) 100 MCG tablet Take 100 mcg by mouth daily.    metFORMIN (GLUCOPHAGE) 1000 MG tablet Take 1,000 mg by mouth 2 (two) times daily.      STOP taking these medications     sulfamethoxazole-trimethoprim (BACTRIM DS,SEPTRA DS) 800-160 MG tablet          DISCHARGE INSTRUCTIONS:   Follow up with doctor at rehabilitation one day   If you experience worsening of your admission symptoms, develop shortness of breath, life  threatening emergency, suicidal or homicidal thoughts you must seek medical attention immediately by calling 911 or calling your MD immediately  if symptoms less severe.  You Must read complete instructions/literature along with all the possible adverse reactions/side effects for all the Medicines you take and that have been prescribed to you. Take any new Medicines after you have completely understood and accept all the possible  adverse reactions/side effects.   Please note  You were cared for by a hospitalist during your hospital stay. If you have any questions about your discharge medications or the care you received while you were in the hospital after you are discharged, you can call the unit and asked to speak with the hospitalist on call if the hospitalist that took care of you is not available. Once you are discharged, your primary care physician will handle any further medical issues. Please note that NO REFILLS for any discharge medications will be authorized once you are discharged, as it is imperative that you return to your primary care physician (or establish a relationship with a primary care physician if you do not have one) for your aftercare needs so that they can reassess your need for medications and monitor your lab values.    Today   CHIEF COMPLAINT:   Chief Complaint  Patient presents with  . Altered Mental Status    HISTORY OF PRESENT ILLNESS:  Cathy Harrington  is a 70 y.o. female brought in with altered mental status   VITAL SIGNS:  Blood pressure 130/73, pulse 101, temperature 98.6 F (37 C), temperature source Oral, resp. rate 18, height 5\' 3"  (1.6 m), weight 83.235 kg (183 lb 8 oz), SpO2 98 %.    PHYSICAL EXAMINATION:  GENERAL:  70 y.o.-year-old patient lying in the bed with no acute distress.  EYES: Pupils equal, round, reactive to light and accommodation. No scleral icterus. Extraocular muscles intact.  HEENT: Head atraumatic, normocephalic. Oropharynx and nasopharynx clear.  NECK:  Supple, no jugular venous distention. No thyroid enlargement, no tenderness.  LUNGS: Normal breath sounds bilaterally, no wheezing, rales,rhonchi or crepitation. No use of accessory muscles of respiration.  CARDIOVASCULAR: S1, S2 normal. No murmurs, rubs, or gallops.  ABDOMEN: Soft, non-tender, non-distended. Bowel sounds present. No organomegaly or mass.  EXTREMITIES: No pedal edema, cyanosis, or  clubbing.  NEUROLOGIC: Cranial nerves II through XII are intact. Sensation intact. Gait not checked.  PSYCHIATRIC: The patient is alert.  SKIN: Scabs seen on left hand and wrist. No signs of infection  DATA REVIEW:   CBC  Recent Labs Lab 05/01/16 0432  WBC 10.2  HGB 11.1*  HCT 32.6*  PLT 246    Chemistries   Recent Labs Lab 04/26/16 1724  05/01/16 0432  NA  --   < > 138  K  --   < > 3.9  CL  --   < > 106  CO2  --   < > 25  GLUCOSE  --   < > 187*  BUN  --   < > 12  CREATININE  --   < > 0.65  CALCIUM  --   < > 9.0  MG 1.7  --   --   < > = values in this interval not displayed.   Microbiology Results  Results for orders placed or performed during the hospital encounter of 04/24/16  Blood culture (routine x 2)     Status: None   Collection Time: 04/24/16 11:38 AM  Result Value Ref Range Status  Specimen Description BLOOD RIGHT ASSIST CONTROL  Final   Special Requests   Final    BOTTLES DRAWN AEROBIC AND ANAEROBIC  AERO 20CC ANA [place]   Culture NO GROWTH 5 DAYS  Final   Report Status 04/29/2016 FINAL  Final  CSF culture     Status: None   Collection Time: 04/24/16 11:38 AM  Result Value Ref Range Status   Specimen Description CSF  Final   Special Requests Normal  Final   Gram Stain RARE WBC SEEN NO ORGANISMS SEEN   Final   Culture   Final    NO GROWTH 3 DAYS Performed at [facility]    Report Status 04/28/2016 FINAL  Final  Culture, blood (routine x 2)     Status: None   Collection Time: 04/24/16 11:38 AM  Result Value Ref Range Status   Specimen Description BLOOD RIGHT ASSIST CONTROL  Final   Special Requests   Final    BOTTLES DRAWN AEROBIC AND ANAEROBIC  AERO 10CC ANA [place]   Culture NO GROWTH 5 DAYS  Final   Report Status 04/29/2016 FINAL  Final  MRSA PCR Screening     Status: None   Collection Time: 04/24/16  3:20 PM  Result Value Ref Range Status   MRSA by PCR NEGATIVE NEGATIVE Final    Comment:        The GeneXpert MRSA Assay  (FDA approved for NASAL specimens only), is one component of a comprehensive MRSA colonization surveillance program. It is not intended to diagnose MRSA infection nor to guide or monitor treatment for MRSA infections.   Urine culture     Status: None   Collection Time: 04/24/16  4:31 PM  Result Value Ref Range Status   Specimen Description URINE, CLEAN CATCH  Final   Special Requests Normal  Final   Culture NO GROWTH Performed at [facility]   Final   Report Status 04/25/2016 FINAL  Final  Culture, respiratory (NON-Expectorated)     Status: None   Collection Time: 04/24/16  4:31 PM  Result Value Ref Range Status   Specimen Description TRACHEAL ASPIRATE  Final   Special Requests NONE  Final   Gram Stain   Final    ABUNDANT WBC PRESENT,BOTH PMN AND MONONUCLEAR FEW GRAM POSITIVE COCCI IN PAIRS RARE GRAM VARIABLE ROD    Culture   Final    Consistent with normal respiratory flora. Performed at [facility]    Report Status 04/27/2016 FINAL  Final    Management plans discussed with the patient, And she is in agreement. Left message for emergency contact.  CODE STATUS:     Code Status Orders        Start     Ordered   04/24/16 1231  Full code   Continuous     04/24/16 1233    Code Status History    Date Active Date Inactive Code Status Order ID Comments User Context   This patient has a current code status but no historical code status.      TOTAL TIME TAKING CARE OF THIS PATIENT: 35 minutes.    Loletha Grayer M.D on 05/02/2016 at 8:17 AM  Between 7am to 6pm - Pager - [phone]  After 6pm go to [email or web address] - password [email or web address]  [employer] Office  [phone]  CC: Primary care physician; No primary care provider on file.

## 2016-06-04 ENCOUNTER — Other Ambulatory Visit: Payer: Self-pay | Admitting: Neurology

## 2016-06-04 DIAGNOSIS — A86 Unspecified viral encephalitis: Secondary | ICD-10-CM

## 2016-06-18 ENCOUNTER — Ambulatory Visit
Admission: RE | Admit: 2016-06-18 | Discharge: 2016-06-18 | Disposition: A | Discharge status: Home / Self Care | Payer: Medicare Other | Source: Ambulatory Visit | Attending: Neurology | Admitting: Neurology

## 2016-06-18 DIAGNOSIS — A86 Unspecified viral encephalitis: Secondary | ICD-10-CM | POA: Insufficient documentation

## 2016-06-18 DIAGNOSIS — G9389 Other specified disorders of brain: Secondary | ICD-10-CM | POA: Insufficient documentation

## 2016-06-18 MED ORDER — GADOBENATE DIMEGLUMINE 529 MG/ML IV SOLN
20.0000 mL | Freq: Once | INTRAVENOUS | Status: AC | PRN
Start: 2016-06-18 — End: 2016-06-18
  Administered 2016-06-18: 17 mL via INTRAVENOUS

## 2016-08-18 ENCOUNTER — Ambulatory Visit
Admission: RE | Admit: 2016-08-18 | Discharge: 2016-08-18 | Disposition: A | Discharge status: Home / Self Care | Payer: Medicare Other | Source: Ambulatory Visit | Attending: Rehabilitation | Admitting: Rehabilitation

## 2016-08-18 ENCOUNTER — Other Ambulatory Visit: Payer: Self-pay | Admitting: Rehabilitation

## 2016-08-18 DIAGNOSIS — M25511 Pain in right shoulder: Secondary | ICD-10-CM | POA: Insufficient documentation

## 2018-01-19 ENCOUNTER — Encounter: Payer: Self-pay | Admitting: Anesthesiology

## 2018-01-19 ENCOUNTER — Ambulatory Visit
Admission: RE | Admit: 2018-01-19 | Discharge: 2018-01-19 | Disposition: A | Discharge status: Home / Self Care | Payer: Medicare Other | Source: Ambulatory Visit | Attending: Gastroenterology | Admitting: Gastroenterology

## 2018-01-19 ENCOUNTER — Ambulatory Visit: Payer: Medicare Other | Admitting: Anesthesiology

## 2018-01-19 ENCOUNTER — Encounter
Admission: RE | Disposition: A | Discharge status: Home / Self Care | Payer: Self-pay | Source: Ambulatory Visit | Attending: Gastroenterology

## 2018-01-19 DIAGNOSIS — Z794 Long term (current) use of insulin: Secondary | ICD-10-CM | POA: Diagnosis not present

## 2018-01-19 DIAGNOSIS — K635 Polyp of colon: Secondary | ICD-10-CM | POA: Insufficient documentation

## 2018-01-19 DIAGNOSIS — Z79899 Other long term (current) drug therapy: Secondary | ICD-10-CM | POA: Insufficient documentation

## 2018-01-19 DIAGNOSIS — E119 Type 2 diabetes mellitus without complications: Secondary | ICD-10-CM | POA: Insufficient documentation

## 2018-01-19 DIAGNOSIS — Z1211 Encounter for screening for malignant neoplasm of colon: Secondary | ICD-10-CM | POA: Diagnosis present

## 2018-01-19 DIAGNOSIS — Z87891 Personal history of nicotine dependence: Secondary | ICD-10-CM | POA: Insufficient documentation

## 2018-01-19 DIAGNOSIS — E039 Hypothyroidism, unspecified: Secondary | ICD-10-CM | POA: Insufficient documentation

## 2018-01-19 DIAGNOSIS — K5730 Diverticulosis of large intestine without perforation or abscess without bleeding: Secondary | ICD-10-CM | POA: Insufficient documentation

## 2018-01-19 DIAGNOSIS — Z7982 Long term (current) use of aspirin: Secondary | ICD-10-CM | POA: Insufficient documentation

## 2018-01-19 HISTORY — PX: COLONOSCOPY WITH PROPOFOL: SHX5780

## 2018-01-19 HISTORY — DX: Cerebral infarction, unspecified: I63.9

## 2018-01-19 LAB — GLUCOSE, CAPILLARY: Glucose-Capillary: 140 mg/dL — ABNORMAL HIGH (ref 65–99)

## 2018-01-19 SURGERY — COLONOSCOPY WITH PROPOFOL
Anesthesia: General

## 2018-01-19 MED ORDER — FENTANYL CITRATE (PF) 100 MCG/2ML IJ SOLN
INTRAMUSCULAR | Status: DC | PRN
  Administered 2018-01-19: 50 ug via INTRAVENOUS

## 2018-01-19 MED ORDER — PROPOFOL 500 MG/50ML IV EMUL
INTRAVENOUS | Status: AC
  Filled 2018-01-19: qty 50

## 2018-01-19 MED ORDER — PROPOFOL 10 MG/ML IV BOLUS
INTRAVENOUS | Status: DC | PRN
  Administered 2018-01-19: 100 mg via INTRAVENOUS

## 2018-01-19 MED ORDER — LIDOCAINE 2% (20 MG/ML) 5 ML SYRINGE
INTRAMUSCULAR | Status: DC | PRN
  Administered 2018-01-19: 30 mg via INTRAVENOUS

## 2018-01-19 MED ORDER — FENTANYL CITRATE (PF) 100 MCG/2ML IJ SOLN
INTRAMUSCULAR | Status: AC
  Filled 2018-01-19: qty 2

## 2018-01-19 MED ORDER — SODIUM CHLORIDE 0.9 % IV SOLN
INTRAVENOUS | Status: DC
  Administered 2018-01-19: 10:00:00 via INTRAVENOUS

## 2018-01-19 MED ORDER — PHENYLEPHRINE HCL 10 MG/ML IJ SOLN
INTRAMUSCULAR | Status: DC | PRN
  Administered 2018-01-19 (×2): 100 ug via INTRAVENOUS

## 2018-01-19 MED ORDER — PROPOFOL 500 MG/50ML IV EMUL
INTRAVENOUS | Status: DC | PRN
  Administered 2018-01-19: 140 ug/kg/min via INTRAVENOUS

## 2018-01-19 NOTE — Anesthesia Postprocedure Evaluation (Signed)
Anesthesia Post Note  Patient: Cathy Harrington  Procedure(s) Performed: COLONOSCOPY WITH PROPOFOL (N/A )  Patient location during evaluation: Endoscopy Anesthesia Type: General Level of consciousness: awake and alert Pain management: pain level controlled Vital Signs Assessment: post-procedure vital signs reviewed and stable Respiratory status: spontaneous breathing, nonlabored ventilation, respiratory function stable and patient connected to nasal cannula oxygen Cardiovascular status: blood pressure returned to baseline and stable Postop Assessment: no apparent nausea or vomiting Anesthetic complications: no     Last Vitals:  Vitals:   01/19/18 1130 01/19/18 1140  BP: (!) 143/82 (!) 144/77  Pulse:  76  Resp:  16  Temp:    SpO2:  99%    Last Pain:  Vitals:   01/19/18 1110  TempSrc: Tympanic                 Jearldine Cassady S

## 2018-01-19 NOTE — Op Note (Signed)
[facility] Gastroenterology Patient Name: Cathy Harrington Procedure Date: 01/19/2018 9:51 AM MRN: [id number] Account #: [id number] Date of Birth: [birth year] Admit Type: Outpatient Age: 72 Room: [facility] ENDO ROOM 3 Gender: Female Note Status: Finalized Procedure:            Colonoscopy Indications:          Screening for colorectal malignant neoplasm Providers:            Lollie Sails, MD Referring MD:         Danae Orleans MD (Referring MD) Medicines:            Monitored Anesthesia Care Complications:        No immediate complications. Procedure:            Pre-Anesthesia Assessment:                       - ASA Grade Assessment: III - A patient with severe                        systemic disease.                       After obtaining informed consent, the colonoscope was                        passed under direct vision. Throughout the procedure,                        the patient's blood pressure, pulse, and oxygen                        saturations were monitored continuously. The Olympus                        PCF-H180AL colonoscope ( S#: [id number] ) was introduced                        through the anus and advanced to the the cecum,                        identified by appendiceal orifice and ileocecal valve.                        The colonoscopy was unusually difficult due to poor                        bowel prep with stool present and a tortuous colon.                        Successful completion of the procedure was aided by                        changing the patient to a supine position, changing the                        patient to a prone position, using manual pressure,                        applying abdominal pressure and lavage. The patient  tolerated the procedure well. Findings:      A 2 mm polyp was found in the distal sigmoid colon. The polyp was       sessile. The polyp was removed with a cold biopsy forceps.  Resection and       retrieval were complete.      Many small and large-mouthed diverticula were found in the sigmoid       colon, descending colon, transverse colon, ascending colon and cecum.      The digital rectal exam was normal. Impression:           - One 2 mm polyp in the distal sigmoid colon, removed                        with a cold biopsy forceps. Resected and retrieved.                       - Diverticulosis in the sigmoid colon, in the                        descending colon, in the transverse colon, in the                        ascending colon and in the cecum. Recommendation:       - Discharge patient to home.                       - Repeat colonoscopy in 3-5 years for screening                        purposes, poor prep today. Procedure Code(s):    --- Professional ---                       [phone], Colonoscopy, flexible; with biopsy, single or                        multiple Diagnosis Code(s):    --- Professional ---                       Z12.11, Encounter for screening for malignant neoplasm                        of colon                       D12.5, Benign neoplasm of sigmoid colon                       K57.30, Diverticulosis of large intestine without                        perforation or abscess without bleeding CPT copyright 2016 American Medical Association. All rights reserved. The codes documented in this report are preliminary and upon coder review may  be revised to meet current compliance requirements. Lollie Sails, MD 01/19/2018 11:15:47 AM This report has been signed electronically. Number of Addenda: 0 Note Initiated On: 01/19/2018 9:51 AM Scope Withdrawal Time: 0 hours 10 minutes 39 seconds  Total Procedure Duration: 0 hours 37 minutes 55 seconds       [facility]

## 2018-01-19 NOTE — Anesthesia Post-op Follow-up Note (Signed)
Anesthesia QCDR form completed.        

## 2018-01-19 NOTE — H&P (Signed)
Outpatient short stay form Pre-procedure 01/19/2018 10:06 AM Lollie Sails MD  Primary Physician: Dr Cristi Loron  Reason for visit: Colonoscopy  History of present illness: Patient is a 72 year old female presenting as above.  Her last colonoscopy was at least 10 years ago.  She takes no aspirin or blood thinning agent.  She tolerated her prep well.    Current Facility-Administered Medications:  .  0.9 %  sodium chloride infusion, , Intravenous, Continuous, Lollie Sails, MD .  0.9 %  sodium chloride infusion, , Intravenous, Continuous, Lollie Sails, MD  Medications Prior to Admission  Medication Sig Dispense Refill Last Dose  . glipiZIDE (GLUCOTROL) 5 MG tablet Take by mouth daily before breakfast.   Past Week at Unknown time  . levothyroxine (SYNTHROID, LEVOTHROID) 100 MCG tablet Take 100 mcg by mouth daily.   01/18/2018 at Unknown time  . lovastatin (MEVACOR) 10 MG tablet Take 10 mg by mouth at bedtime.   Past Week at Unknown time  . metFORMIN (GLUCOPHAGE) 1000 MG tablet Take 1,000 mg by mouth 2 (two) times daily.   Past Week at Unknown time  . acyclovir 800 mg in dextrose 5 % 150 mL Inject 800 mg into the vein every 8 (eight) hours. (Patient not taking: Reported on 01/19/2018) 15 Dose 0 Completed Course at Unknown time  . enoxaparin (LOVENOX) 40 MG/0.4ML injection Inject 0.4 mLs (40 mg total) into the skin daily. (Patient not taking: Reported on 01/19/2018) 0 Syringe  Not Taking at Unknown time  . insulin aspart (NOVOLOG) 100 UNIT/ML injection Inject 4 Units into the skin 3 (three) times daily with meals. (Patient not taking: Reported on 01/19/2018) 10 mL 0 Not Taking at Unknown time  . insulin glargine (LANTUS) 100 UNIT/ML injection Inject 0.1 mLs (10 Units total) into the skin at bedtime. (Patient not taking: Reported on 01/19/2018) 10 mL 0 Not Taking at Unknown time  . levETIRAcetam (KEPPRA) 500 MG tablet Take 1 tablet (500 mg total) by mouth 2 (two) times daily. (Patient  not taking: Reported on 01/19/2018) 60 tablet 0 Not Taking at Unknown time     No Known Allergies   Past Medical History:  Diagnosis Date  . Diabetes mellitus without complication ([place])   . Hypothyroidism   . Thyroid disease     Review of systems:      Physical Exam    Heart and lungs: Without rub or gallop, lungs are bilaterally clear.    HEENT: Normocephalic atraumatic eyes are anicteric    Other:    Pertinant exam for procedure: Soft nontender nondistended bowel sounds positive normoactive    Planned proceedures: Colonoscopy and indicated procedures. I have discussed the risks benefits and complications of procedures to include not limited to bleeding, infection, perforation and the risk of sedation and the patient wishes to proceed.    Lollie Sails, MD Gastroenterology 01/19/2018  10:06 AM

## 2018-01-19 NOTE — Anesthesia Preprocedure Evaluation (Addendum)
Anesthesia Evaluation  Patient identified by MRN, date of birth, ID band Patient awake    Reviewed: Allergy & Precautions, NPO status , Patient's Chart, lab work & pertinent test results, reviewed documented beta blocker date and time   Airway Mallampati: III  TM Distance: >3 FB     Dental  (+) Chipped   Pulmonary former smoker,           Cardiovascular      Neuro/Psych Seizures -,     GI/Hepatic   Endo/Other  diabetes, Type 2Hypothyroidism   Renal/GU      Musculoskeletal   Abdominal   Peds  Hematology   Anesthesia Other Findings Had a stroke few years ago.  Reproductive/Obstetrics                            Anesthesia Physical Anesthesia Plan  ASA: III  Anesthesia Plan: General   Post-op Pain Management:    Induction:   PONV Risk Score and Plan:   Airway Management Planned:   Additional Equipment:   Intra-op Plan:   Post-operative Plan:   Informed Consent: I have reviewed the patients History and Physical, chart, labs and discussed the procedure including the risks, benefits and alternatives for the proposed anesthesia with the patient or authorized representative who has indicated his/her understanding and acceptance.     Plan Discussed with: CRNA  Anesthesia Plan Comments:         Anesthesia Quick Evaluation

## 2018-01-19 NOTE — Transfer of Care (Signed)
Immediate Anesthesia Transfer of Care Note  Patient: Cathy Harrington  Procedure(s) Performed: COLONOSCOPY WITH PROPOFOL (N/A )  Patient Location: PACU and Endoscopy Unit  Anesthesia Type:General  Level of Consciousness: sedated  Airway & Oxygen Therapy: Patient Spontanous Breathing and Patient connected to nasal cannula oxygen  Post-op Assessment: Report given to RN and Post -op Vital signs reviewed and stable  Post vital signs: Reviewed and stable  Last Vitals:  Vitals:   01/19/18 0952 01/19/18 1110  BP: (!) 146/71 121/75  Pulse: 84 76  Resp: 16 16  Temp: (!) 36.1 C (!) 36.1 C  SpO2: 99% 100%    Last Pain:  Vitals:   01/19/18 1110  TempSrc: Tympanic         Complications: No apparent anesthesia complications

## 2018-01-20 ENCOUNTER — Encounter: Payer: Self-pay | Admitting: Gastroenterology

## 2018-01-21 LAB — SURGICAL PATHOLOGY

## 2018-10-12 ENCOUNTER — Ambulatory Visit: Payer: Medicare Other | Attending: Neurology | Admitting: Speech Pathology

## 2018-10-12 DIAGNOSIS — R41841 Cognitive communication deficit: Secondary | ICD-10-CM | POA: Diagnosis present

## 2018-10-13 ENCOUNTER — Other Ambulatory Visit: Payer: Self-pay

## 2018-10-13 ENCOUNTER — Encounter: Payer: Self-pay | Admitting: Speech Pathology

## 2018-10-13 NOTE — Therapy (Signed)
[place] MAIN [facility] SERVICES [address] [place], [place], [address] Phone: [phone]   Fax:  [phone]  Speech Language Pathology Evaluation  Patient Details  Name: Cathy Harrington MRN: [id number] Date of Birth: 1946-08-25 Referring Provider (SLP): Anabel Bene   Encounter Date: 10/12/2018  End of Session - 10/13/18 1429    Visit Number  1    Number of Visits  9    Date for SLP Re-Evaluation  11/12/18    SLP Start Time  1000    SLP Stop Time   1100    SLP Time Calculation (min)  60 min    Activity Tolerance  Patient tolerated treatment well       Past Medical History:  Diagnosis Date  . Diabetes mellitus without complication ([place])   . Hypothyroidism   . Stroke ([place])    2017 1 week [facility] and 2 weeks [facility] 6 months rehab  . Thyroid disease     Past Surgical History:  Procedure Laterality Date  . BACK SURGERY    . BREAST SURGERY    . COLONOSCOPY    . COLONOSCOPY WITH PROPOFOL N/A 01/19/2018   Procedure: COLONOSCOPY WITH PROPOFOL;  Surgeon: Lollie Sails, MD;  Location: [facility] ENDOSCOPY;  Service: Endoscopy;  Laterality: N/A;  . parathyroidectomy      There were no vitals filed for this visit.      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  The patient received SLP services at [facility] at the time.  The patient is reporting difficulty remembering names and was referred for SLP for cognitive therapy.      Prior Functional Status   Cognitive/Linguistic Baseline  Baseline deficits    Baseline deficit details   Encephalitis 2 1/2 years ago      Cognition   Overall Cognitive Status  Impaired/Different from baseline    Area of Impairment  Memory      Auditory Comprehension   Overall Auditory Comprehension  Appears within functional limits for tasks assessed      Reading Comprehension   Reading Status  Within funtional limits      Verbal Expression   Overall Verbal Expression  Impaired    Other Verbal Expression Comments  Word finding, vague language      Written Expression   Written Expression  Within Functional Limits      Oral Motor/Sensory Function   Overall Oral Motor/Sensory Function  Appears within functional limits for tasks assessed      Motor Speech   Overall Motor Speech  Appears within functional limits for tasks assessed      Standardized Assessments   Standardized Assessments   Montreal Cognitive Assessment (MOCA);Western Aphasia Battery revised        Western Aphasia Battery- Screening  Spontaneous Speech      Information content   7/10       Fluency    9/10     Comprehension     Yes/No questions  10/10          Sequential Commands  8/10     Repetition    8/10     Naming    Object Naming   10/10       Screening Aphasia Quotient 87/100  Reading    10/10 (When allowed to have text to answer questions) Writing    9/10 Screening Language Quotient 89/100  Montreal Cognitive Assessment [facility]) Version: 8.1 Visuospatieal/Executive Alternating trail making       0/1 Visuoconstruction Skills (copy 3-d design) 1/1 Draw a clock     2/3 Naming     2/3 Attention Forward digit span    1/1 Backward digit span    1/1 Vigilance     1/1 Serial 7's     3/3 Language  Verbal Fluency     0/1 Repetition     1/2 Abstraction     0/2 Delayed Recall    0/5  Memory Index Score  2/15 Orientation     5/6 TOTAL      17/30       Normal  ? 26/30     SLP Education - 10/13/18 1428    Education Details  Role of ST in cognitive therapy    Person(s) Educated  Patient    Methods   Explanation    Comprehension  Verbalized understanding         SLP Long Term Goals - 10/13/18 1432      SLP LONG TERM GOAL #1   Title  Patient will complete complex memory activities with 80% accuracy.    Time  4    Period  Weeks    Status  New    Target Date  11/12/18      SLP LONG TERM GOAL #2   Title  Patient will demonstrate functional cognitive-communication skills for independent completion of personal responsibilities and leisure activities.    Time  4    Period  Weeks    Status  New    Target Date  11/12/18      SLP LONG TERM GOAL #3   Title  Patient will complete high level word finding activities with 80% accuracy.    Time  4    Period  Weeks    Status  New    Target Date  11/12/18       Plan - 10/13/18 1429    Clinical Impression Statement  At 72  years post onset of encephalitis, the patient is presenting with mild-moderate cognitive communication impairment characterized by word finding difficulty, short term memory impairment, and reduced executive skills.  The patient has functional reading and writing skills for use in compensatory strategies.   The patient would benefit from skilled speech therapy for restorative and compensatory treatment of cognitive communication deficts.     Speech Therapy Frequency  2x / week    Duration  4 weeks    Treatment/Interventions  Language facilitation;Cognitive reorganization;Patient/family education    Potential to Achieve Goals  Good    Potential Considerations  Ability to learn/carryover information;Pain level;Family/community support;Co-morbidities;Previous level of function;Cooperation/participation level;Severity of impairments;Medical prognosis    SLP Home Exercise Plan  TBD    Consulted and Agree with Plan of Care  Patient       Patient will benefit from skilled therapeutic intervention in order to improve the following deficits and impairments:   Cognitive communication deficit - Plan: SLP plan of care  cert/re-cert    Problem List Patient Active Problem List  Diagnosis Date Noted  . Altered mental status   . Pyrexia   . Seizure ([place])   . Encephalitis   . Seizures ([place]) 04/24/2016  . Acute respiratory failure ([place]) 04/24/2016   [name], [name]  Lou Miner 10/13/2018, 2:38 PM  [place] MAIN [facility] SERVICES [address] [place], [place], [address] Phone: [phone]   Fax:  [phone]  Name: Cathy Harrington MRN: [id number] Date of Birth: 1946/08/27

## 2018-10-19 ENCOUNTER — Encounter: Payer: Self-pay | Admitting: Speech Pathology

## 2018-10-19 ENCOUNTER — Ambulatory Visit: Payer: Medicare Other | Admitting: Speech Pathology

## 2018-10-19 DIAGNOSIS — R41841 Cognitive communication deficit: Secondary | ICD-10-CM

## 2018-10-19 NOTE — Therapy (Signed)
[place] MAIN [facility] SERVICES [address] [place], [place], [address] Phone: [phone]   Fax:  [phone]  Speech Language Pathology Treatment  Patient Details  Name: Cathy Harrington MRN: [id number] Date of Birth: 1946-04-08 Referring Provider (SLP): Anabel Bene   Encounter Date: 10/19/2018  End of Session - 10/19/18 1331    Visit Number  2    Number of Visits  9    Date for SLP Re-Evaluation  11/12/18    SLP Start Time  0900    SLP Stop Time   0952    SLP Time Calculation (min)  52 min    Activity Tolerance  Patient tolerated treatment well       Past Medical History:  Diagnosis Date  . Diabetes mellitus without complication ([place])   . Hypothyroidism   . Stroke ([place])    2017 1 week [facility] and 2 weeks [facility] 6 months rehab  . Thyroid disease     Past Surgical History:  Procedure Laterality Date  . BACK SURGERY    . BREAST SURGERY    . COLONOSCOPY    . COLONOSCOPY WITH PROPOFOL N/A 01/19/2018   Procedure: COLONOSCOPY WITH PROPOFOL;  Surgeon: Lollie Sails, MD;  Location: [facility] ENDOSCOPY;  Service: Endoscopy;  Laterality: N/A;  . parathyroidectomy      There were no vitals filed for this visit.  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  The patient received SLP services at [facility] at the time.  The patient is reporting difficulty remembering names and was referred for SLP for cognitive therapy.       Treatment Provided   Treatment provided  Cognitive-Linquistic      Pain Assessment   Pain Assessment  No/denies pain      Cognitive-Linquistic Treatment   Treatment focused on   Cognition;Patient/family/caregiver education    Skilled Treatment  MEMORY:  Immediate memory- repeats 3 word with 90% accuracy, 4 words with 75% accuracy.  Errors are due (in part) to hearing and/or auditory attention.  Short term memory- answer questions RE: short classified ad with 45% accuracy.  Long term memory- answer general knowledge questions with 50% accuracy (eliminating questions she would not know secondary not growing up in this country).      Assessment / Recommendations / Plan   Plan  Continue with current plan of care      Progression Toward Goals   Progression toward goals  Progressing toward goals       SLP Education - 10/19/18 1331    Education Details  Basic restorative and compensatory strategies    Person(s) Educated  Patient    Methods  Explanation    Comprehension  Verbalized understanding         SLP Long Term Goals - 10/13/18 1432      SLP LONG TERM GOAL #1   Title  Patient will complete complex memory activities with 80% accuracy.    Time  4    Period  Weeks    Status  New    Target Date  11/12/18      SLP LONG TERM GOAL #2  Title  Patient will demonstrate functional cognitive-communication skills for independent completion of personal responsibilities and leisure activities.    Time  4    Period  Weeks    Status  New    Target Date  11/12/18      SLP LONG TERM GOAL #3   Title  Patient will complete high level word finding activities with 80% accuracy.    Time  4    Period  Weeks    Status  New    Target Date  11/12/18       Plan - 10/19/18 1332    Clinical Impression Statement  The patient demonstrates immediate/short term/long term memory deficits.  Some errors are due to faulty hearing of verbal information, word finding errors, and frank memory impairment.    Speech Therapy Frequency  2x / week    Duration  4 weeks    Treatment/Interventions  Language facilitation;Cognitive reorganization;Patient/family education    Potential to  Achieve Goals  Good    Potential Considerations  Ability to learn/carryover information;Pain level;Family/community support;Co-morbidities;Previous level of function;Cooperation/participation level;Severity of impairments;Medical prognosis    Consulted and Agree with Plan of Care  Patient       Patient will benefit from skilled therapeutic intervention in order to improve the following deficits and impairments:   Cognitive communication deficit    Problem List Patient Active Problem List   Diagnosis Date Noted  . Altered mental status   . Pyrexia   . Seizure ([place])   . Encephalitis   . Seizures ([place]) 04/24/2016  . Acute respiratory failure ([place]) 04/24/2016   [name], [name]  Lou Miner 10/19/2018, 1:33 PM  [place] MAIN [facility] SERVICES [address] [place], [place], [address] Phone: [phone]   Fax:  [phone]   Name: Cathy Harrington MRN: [id number] Date of Birth: 12-03-1945

## 2018-10-26 ENCOUNTER — Ambulatory Visit: Payer: Medicare Other | Admitting: Speech Pathology

## 2018-10-29 ENCOUNTER — Ambulatory Visit: Payer: Medicare Other | Admitting: Speech Pathology

## 2018-10-29 ENCOUNTER — Encounter: Payer: Self-pay | Admitting: Speech Pathology

## 2018-10-29 DIAGNOSIS — R41841 Cognitive communication deficit: Secondary | ICD-10-CM | POA: Diagnosis not present

## 2018-10-29 NOTE — Therapy (Signed)
[place] MAIN [facility] SERVICES [address] [place], [place], [address] Phone: [phone]   Fax:  [phone]  Speech Language Pathology Treatment  Patient Details  Name: HADLEE BURBACK MRN: [id number] Date of Birth: 1946/07/04 Referring Provider (SLP): Anabel Bene   Encounter Date: 10/29/2018  End of Session - 10/29/18 1128    Visit Number  3    Number of Visits  9    Date for SLP Re-Evaluation  11/12/18    SLP Start Time  1000    SLP Stop Time   1056    SLP Time Calculation (min)  56 min    Activity Tolerance  Patient tolerated treatment well       Past Medical History:  Diagnosis Date  . Diabetes mellitus without complication ([place])   . Hypothyroidism   . Stroke ([place])    2017 1 week [facility] and 2 weeks [facility] 6 months rehab  . Thyroid disease     Past Surgical History:  Procedure Laterality Date  . BACK SURGERY    . BREAST SURGERY    . COLONOSCOPY    . COLONOSCOPY WITH PROPOFOL N/A 01/19/2018   Procedure: COLONOSCOPY WITH PROPOFOL;  Surgeon: Lollie Sails, MD;  Location: [facility] ENDOSCOPY;  Service: Endoscopy;  Laterality: N/A;  . parathyroidectomy      There were no vitals filed for this visit.  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  The patient received SLP services at [facility] at the time.  The patient is reporting difficulty remembering names and was referred for SLP for cognitive therapy.       Treatment Provided   Treatment provided  Cognitive-Linquistic      Pain Assessment   Pain Assessment  No/denies pain      Cognitive-Linquistic Treatment   Treatment focused on   Cognition;Patient/family/caregiver education    Skilled Treatment  MEMORY:  Immediate memory- repeats 4 word with 75% accuracy.  Answer question RE: list with 55% accuracy.  Errors are due (in part) to hearing and/or auditory attention.  Short term memory- recall 4 pictures immediately, 3/4 after delay; 2/4 after second delay; 4/4 after .  Word finding- state item given definition with 65% accuracy (85% allowing Arabic responses).      Assessment / Recommendations / Plan   Plan  Continue with current plan of care      Progression Toward Goals   Progression toward goals  Progressing toward goals       SLP Education - 10/29/18 1127    Education Details  Basic restorative and compensatory strategies    Person(s) Educated  Patient    Methods  Explanation    Comprehension  Verbalized understanding         SLP Long Term Goals - 10/13/18 1432      SLP LONG TERM GOAL #1   Title  Patient will complete complex memory activities with 80% accuracy.    Time  4    Period  Weeks    Status  New    Target Date  11/12/18      SLP LONG TERM GOAL #2   Title  Patient will demonstrate functional cognitive-communication skills for independent completion of personal responsibilities and leisure activities.    Time  4    Period  Weeks    Status  New    Target Date  11/12/18      SLP LONG TERM GOAL #3   Title  Patient will complete high level word finding activities with 80% accuracy.    Time  4    Period  Weeks    Status  New    Target Date  11/12/18       Plan - 10/29/18 1129    Clinical Impression Statement  The patient demonstrates immediate/short term/long term memory deficits.  Some errors are due to faulty hearing of verbal information, word finding errors, and frank memory impairment.    Speech Therapy Frequency  2x / week    Duration  4 weeks    Treatment/Interventions  Language facilitation;Cognitive reorganization;Patient/family education    Potential to Achieve Goals  Good     Potential Considerations  Ability to learn/carryover information;Pain level;Family/community support;Co-morbidities;Previous level of function;Cooperation/participation level;Severity of impairments;Medical prognosis    SLP Home Exercise Plan  Get notebook that will fit into pocketbook    Consulted and Agree with Plan of Care  Patient       Patient will benefit from skilled therapeutic intervention in order to improve the following deficits and impairments:   Cognitive communication deficit    Problem List Patient Active Problem List   Diagnosis Date Noted  . Altered mental status   . Pyrexia   . Seizure ([place])   . Encephalitis   . Seizures ([place]) 04/24/2016  . Acute respiratory failure ([place]) 04/24/2016   [name], [name]  Lou Miner 10/29/2018, 11:30 AM  [place] MAIN [facility] SERVICES [address] [place], [place], [address] Phone: [phone]   Fax:  [phone]   Name: GELISA TIEKEN MRN: [id number] Date of Birth: 1946/03/16

## 2018-11-02 ENCOUNTER — Ambulatory Visit: Payer: Medicare Other | Admitting: Speech Pathology

## 2018-11-02 ENCOUNTER — Encounter: Payer: Self-pay | Admitting: Speech Pathology

## 2018-11-02 DIAGNOSIS — R41841 Cognitive communication deficit: Secondary | ICD-10-CM

## 2018-11-02 NOTE — Therapy (Signed)
[place] MAIN [facility] SERVICES [address] [place], [place], [address] Phone: [phone]   Fax:  [phone]  Speech Language Pathology Treatment  Patient Details  Name: Cathy Harrington MRN: [id number] Date of Birth: Mar 07, 1946 Referring Provider (SLP): Anabel Bene   Encounter Date: 11/02/2018  End of Session - 11/02/18 1512    Visit Number  4    Number of Visits  9    Date for SLP Re-Evaluation  11/12/18    SLP Start Time  0900    SLP Stop Time   1000    SLP Time Calculation (min)  60 min    Activity Tolerance  Patient tolerated treatment well       Past Medical History:  Diagnosis Date  . Diabetes mellitus without complication ([place])   . Hypothyroidism   . Stroke ([place])    2017 1 week [facility] and 2 weeks [facility] 6 months rehab  . Thyroid disease     Past Surgical History:  Procedure Laterality Date  . BACK SURGERY    . BREAST SURGERY    . COLONOSCOPY    . COLONOSCOPY WITH PROPOFOL N/A 01/19/2018   Procedure: COLONOSCOPY WITH PROPOFOL;  Surgeon: Lollie Sails, MD;  Location: [facility] ENDOSCOPY;  Service: Endoscopy;  Laterality: N/A;  . parathyroidectomy      There were no vitals filed for this visit.  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  The patient received SLP services at [facility] at the time.  The patient is reporting difficulty remembering names and was referred for SLP for cognitive therapy.       Treatment Provided   Treatment provided  Cognitive-Linquistic      Pain Assessment   Pain Assessment  No/denies pain      Cognitive-Linquistic Treatment   Treatment focused on   Cognition;Patient/family/caregiver education    Skilled Treatment  MEMORY:  Immediate memory- repeats 4 word with 75% accuracy.    Errors are due (in part) to hearing and/or auditory attention.  Short term memory- recall 4 pictures from last session, 2/4 after delay; 4/4 after second delay; 4/4 after delay.  Identify irrelevant information in short paragraph read aloud with 60% accuracy.  Recall associated person to object after guided visualization learning with 90% accuracy.      Assessment / Recommendations / Plan   Plan  Continue with current plan of care      Progression Toward Goals   Progression toward goals  Progressing toward goals       SLP Education - 11/02/18 1512    Education Details  WRAP memory strategies    Person(s) Educated  Patient    Methods  Explanation    Comprehension  Verbalized understanding         SLP Long Term Goals - 10/13/18 1432      SLP LONG TERM GOAL #1   Title  Patient will complete complex memory activities with 80% accuracy.    Time  4    Period  Weeks    Status  New    Target Date  11/12/18      SLP LONG TERM GOAL #  2   Title  Patient will demonstrate functional cognitive-communication skills for independent completion of personal responsibilities and leisure activities.    Time  4    Period  Weeks    Status  New    Target Date  11/12/18      SLP LONG TERM GOAL #3   Title  Patient will complete high level word finding activities with 80% accuracy.    Time  4    Period  Weeks    Status  New    Target Date  11/12/18       Plan - 11/02/18 1513    Clinical Impression Statement  The patient demonstrates immediate/short term/long term memory deficits.  Some errors are due to faulty hearing of verbal information, word finding errors, and frank memory impairment.    Speech Therapy Frequency  2x / week    Duration  4 weeks    Treatment/Interventions  Language facilitation;Cognitive reorganization;Patient/family education    Potential to  Achieve Goals  Good    Potential Considerations  Ability to learn/carryover information;Pain level;Family/community support;Co-morbidities;Previous level of function;Cooperation/participation level;Severity of impairments;Medical prognosis    SLP Home Exercise Plan  Get notebook that will fit into pocketbook    Consulted and Agree with Plan of Care  Patient       Patient will benefit from skilled therapeutic intervention in order to improve the following deficits and impairments:   Cognitive communication deficit    Problem List Patient Active Problem List   Diagnosis Date Noted  . Altered mental status   . Pyrexia   . Seizure ([place])   . Encephalitis   . Seizures ([place]) 04/24/2016  . Acute respiratory failure ([place]) 04/24/2016   [name], [name]  Lou Miner 11/02/2018, 3:13 PM  [place] MAIN [facility] SERVICES [address] [place], [place], [address] Phone: [phone]   Fax:  [phone]   Name: Cathy Harrington MRN: [id number] Date of Birth: [birth year]

## 2018-11-04 ENCOUNTER — Encounter: Payer: Medicare Other | Admitting: Speech Pathology

## 2018-11-09 ENCOUNTER — Encounter: Payer: Self-pay | Admitting: Speech Pathology

## 2018-11-09 ENCOUNTER — Ambulatory Visit: Payer: Medicare Other | Attending: Neurology | Admitting: Speech Pathology

## 2018-11-09 DIAGNOSIS — M6281 Muscle weakness (generalized): Secondary | ICD-10-CM | POA: Diagnosis present

## 2018-11-09 DIAGNOSIS — R293 Abnormal posture: Secondary | ICD-10-CM | POA: Diagnosis present

## 2018-11-09 DIAGNOSIS — G8929 Other chronic pain: Secondary | ICD-10-CM | POA: Insufficient documentation

## 2018-11-09 DIAGNOSIS — R41841 Cognitive communication deficit: Secondary | ICD-10-CM

## 2018-11-09 DIAGNOSIS — M545 Low back pain: Secondary | ICD-10-CM | POA: Insufficient documentation

## 2018-11-09 NOTE — Therapy (Signed)
[place] MAIN [facility] SERVICES [address] [place], [place], [address] Phone: [phone]   Fax:  [phone]  Speech Language Pathology Treatment  Patient Details  Name: Cathy Harrington MRN: [id number] Date of Birth: April 23, 1946 Referring Provider (SLP): Anabel Bene   Encounter Date: 11/09/2018  End of Session - 11/09/18 1158    Visit Number  5    Number of Visits  9    Date for SLP Re-Evaluation  11/12/18    SLP Start Time  0900    SLP Stop Time   1000    SLP Time Calculation (min)  60 min    Activity Tolerance  Patient tolerated treatment well       Past Medical History:  Diagnosis Date  . Diabetes mellitus without complication ([place])   . Hypothyroidism   . Stroke ([place])    2017 1 week [facility] and 2 weeks [facility] 6 months rehab  . Thyroid disease     Past Surgical History:  Procedure Laterality Date  . BACK SURGERY    . BREAST SURGERY    . COLONOSCOPY    . COLONOSCOPY WITH PROPOFOL N/A 01/19/2018   Procedure: COLONOSCOPY WITH PROPOFOL;  Surgeon: Lollie Sails, MD;  Location: [facility] ENDOSCOPY;  Service: Endoscopy;  Laterality: N/A;  . parathyroidectomy      There were no vitals filed for this visit.  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  The patient received SLP services at [facility] at the time.  The patient is reporting difficulty remembering names and was referred for SLP for cognitive therapy.       Treatment Provided   Treatment provided  Cognitive-Linquistic      Pain Assessment   Pain Assessment  No/denies pain      Cognitive-Linquistic Treatment   Treatment focused on   Cognition;Patient/family/caregiver education    Skilled Treatment  MEMORY:  Immediate memory- repeats 4 word with 75% accuracy.    Errors are due (in part) to hearing and/or auditory attention.  Short term memory- recall 4 pictures from last session, 4/4.  Recall associated person to object after guided visualization learning with 90% accuracy.  Learn 16 words, using sorting and remembering categories: 11/16 given max SLP assistance to rehearse/encode.  Follow 2-step, 4-component verbal commands with 70% accuracy.      Assessment / Recommendations / Plan   Plan  Continue with current plan of care      Progression Toward Goals   Progression toward goals  Progressing toward goals       SLP Education - 11/09/18 1157    Education Details  How to use a notebook to aid memory    Person(s) Educated  Patient    Methods  Explanation    Comprehension  Verbalized understanding;Need further instruction         SLP Long Term Goals - 10/13/18 1432      SLP LONG TERM GOAL #1   Title  Patient will complete complex memory activities with 80% accuracy.    Time  4    Period  Weeks    Status  New    Target Date  11/12/18      SLP LONG TERM GOAL #2   Title  Patient will demonstrate functional cognitive-communication skills for independent completion of personal responsibilities and leisure activities.    Time  4    Period  Weeks    Status  New    Target Date  11/12/18      SLP LONG TERM GOAL #3   Title  Patient will complete high level word finding activities with 80% accuracy.    Time  4    Period  Weeks    Status  New    Target Date  11/12/18       Plan - 11/09/18 1159    Clinical Impression Statement  The patient demonstrates immediate/short term/long term memory deficits.  Some errors are due to faulty hearing of verbal information, word finding errors, and frank memory impairment.    Speech Therapy Frequency  2x / week    Duration  4 weeks    Treatment/Interventions  Language  facilitation;Cognitive reorganization;Patient/family education    Potential to Achieve Goals  Good    Potential Considerations  Ability to learn/carryover information;Pain level;Family/community support;Co-morbidities;Previous level of function;Cooperation/participation level;Severity of impairments;Medical prognosis    Consulted and Agree with Plan of Care  Patient       Patient will benefit from skilled therapeutic intervention in order to improve the following deficits and impairments:   Cognitive communication deficit    Problem List Patient Active Problem List   Diagnosis Date Noted  . Altered mental status   . Pyrexia   . Seizure ([place])   . Encephalitis   . Seizures ([place]) 04/24/2016  . Acute respiratory failure ([place]) 04/24/2016   [name], [name]  Lou Miner 11/09/2018, 12:00 PM  [place] MAIN [facility] SERVICES [address] [place], [place], [address] Phone: [phone]   Fax:  [phone]   Name: Cathy Harrington MRN: [id number] Date of Birth: [birth year]

## 2018-11-11 ENCOUNTER — Ambulatory Visit: Payer: Medicare Other

## 2018-11-11 ENCOUNTER — Encounter: Payer: Self-pay | Admitting: Speech Pathology

## 2018-11-11 ENCOUNTER — Other Ambulatory Visit: Payer: Self-pay

## 2018-11-11 ENCOUNTER — Ambulatory Visit: Payer: Medicare Other | Admitting: Speech Pathology

## 2018-11-11 DIAGNOSIS — M545 Low back pain, unspecified: Secondary | ICD-10-CM

## 2018-11-11 DIAGNOSIS — R41841 Cognitive communication deficit: Secondary | ICD-10-CM

## 2018-11-11 DIAGNOSIS — M6281 Muscle weakness (generalized): Secondary | ICD-10-CM

## 2018-11-11 DIAGNOSIS — G8929 Other chronic pain: Secondary | ICD-10-CM

## 2018-11-11 DIAGNOSIS — R293 Abnormal posture: Secondary | ICD-10-CM

## 2018-11-11 NOTE — Therapy (Signed)
[place] MAIN [facility] SERVICES [address] [place], [place], [address] Phone: [phone]   Fax:  [phone]  Physical Therapy Evaluation  Patient Details  Name: Cathy Harrington MRN: [id number] Date of Birth: 11-10-1945 Referring Provider (PT): Dr. Danae Orleans    Encounter Date: 11/11/2018  PT End of Session - 11/11/18 1251    Visit Number  1    Number of Visits  4    Date for PT Re-Evaluation  12/09/18    Authorization Type  1/10 eval 11/11/18    PT Start Time  1016    PT Stop Time  1109    PT Time Calculation (min)  53 min    Activity Tolerance  Patient tolerated treatment well    Behavior During Therapy  [facility] for tasks assessed/performed       Past Medical History:  Diagnosis Date  . Diabetes mellitus without complication ([place])   . Hypothyroidism   . Stroke ([place])    2017 1 week [facility] and 2 weeks [facility] 6 months rehab  . Thyroid disease     Past Surgical History:  Procedure Laterality Date  . BACK SURGERY    . BREAST SURGERY    . COLONOSCOPY    . COLONOSCOPY WITH PROPOFOL N/A 01/19/2018   Procedure: COLONOSCOPY WITH PROPOFOL;  Surgeon: Lollie Sails, MD;  Location: [facility] ENDOSCOPY;  Service: Endoscopy;  Laterality: N/A;  . parathyroidectomy      There were no vitals filed for this visit.     Subjective Assessment - 11/11/18 1025    Subjective  Patient is a pleasant 73 year old female who presents for low back pain.     Pertinent History  Patient is a pleasant 73 year old woman who is referred for acute midline low back pain without sciatica. Patient has a history of type II diabetes, thyroid disease (TSH), viral encephalitis, HLD, Insomnia, overweight, DDD lumbar, memory deficits. Pt. had lumbar herniated disk surgery multiple years ago (about 34) . Patient uses voltaren gel 4x a day for her back pain and has hand imaging which showed mild grade 1 L3 and L4 anterolisthesis, with multiple intervertebral disc space narrowing  endplate osteophytosis and facet arthrosis greatest at L4-5 and L5-S1 indicating multilevel lumbar spine degenerative disc disease.  Patient has had acute pain for the past couple weeks, potentially due to new exercises done at senior center which she has stopped for the time being. Pain is localized to lower back with no leg symptoms.     Limitations  Reading;Lifting;Standing;Walking;House hold activities;Other (comment)    How long can you sit comfortably?  no limit    How long can you stand comfortably?  right away     How long can you walk comfortably?  walks in mall but is painful     Diagnostic tests  x ray: mild grade 1 L3 and L4 anterolisthesis, with multiple intervertebral disc space narrowing endplate osteophytosis and facet arthrosis greatest at L4-5 and L5-S1 indicating multilevel lumbar spine degenerative disc disease.    Patient Stated Goals  decrease back pain     Currently in Pain?  Yes    Pain Score  3     Pain Location  Back    Pain Orientation  Posterior;Lower    Pain Descriptors / Indicators  Aching;Stabbing    Pain Type  Chronic pain    Pain Radiating Towards  no radiation     Pain Onset  More than a month  ago    Pain Frequency  Intermittent    Aggravating Factors   leaning forward, standing, walking    Pain Relieving Factors  voltaren cream    Effect of Pain on Daily Activities  limited in exercise class      PAIN: Current: 3/10  Worst pain: 7/10  Pain is worse in the morning when first waking up  POSTURE: Flattened lower lumbar spine  Excessive shifting of weight in standing and sitting, Seated: forward head rounded shoulders  PROM/AROM: Hamstring length: WFL bilaterally Hip flexor/Illiopsoas: Limited bilaterally, painful towards end range,   Trunk Flexion Limited 38 degrees flexion,*  Trunk Extension Limited; 8 degrees *  Trunk R SB WFL *  Trunk L SB WFL *  Trunk R rotation WFL *  Trunk L rotation WFL *   *elicits concurrent pain   Passive Accessory  Motions: CPAs: lumbar hypomobile grade I, painful with no movement at L5 S1.  UPAs: hypomobile and painful grade I, relieved pain at L5 L UPA.   STRENGTH:  Graded on a 0-5 scale Muscle Group Left Right  Hip Flex 4/5 4/5  Hip Abd 4/5 4/5  Hip Add 4-/5 4-/5  Hip Ext 3+/5 painful 3+/5 painful      Knee Flex 4/5 4/5  Knee Ext 4/5 4/5  Ankle DF 4/5 4/5  Ankle PF 4/5 4/5   Trunk Flexion 2+/5 painful  Trunk Extension 2+/5 painful   Core: 2+: unable to lift shoulders off matt (<50% range of motion), palpable muscle activation   SENSATION/ Palpation: WFL sensation  Palpation: tender and excessive tightness of thoracolumbar paraspinals. Limited muscle tissue length and tenderness to palpation of bilateral piriformis with L more painful than right.   SPECIAL TESTS:  L piriformis painful +R SLR Posterior pelvic tilt-felt good - Slump test  FUNCTIONAL MOBILITY: Painful and difficulty rolling from prone to supine requiring excessive time and rest of 30 seconds afterwards.     GAIT: Antalgic gait with decreased lateral/rotational trunk movement. Ambulates without AD with limited arm swing  OUTCOME MEASURES: TEST Outcome Interpretation  5 times sit<>stand 19 sec increased pain, hands on knees  >60 yo, >15 sec indicates increased risk for falls  MODI 28%  Moderate perceived disability       Objective measurements completed on examination: See above findings.     Treat:    Access Code: [email or web address]  URL: [email or web address]  Date: 11/11/2018  Prepared by: Janna Arch   Exercises  Modified [place] - 2 reps - 2 sets - 30 hold - 1x daily - 7x weekly  Supine Posterior Pelvic Tilt - 10 reps - 2 sets - 5 hold - 1x daily - 7x weekly  Hooklying Lumbar Rotation - 10 reps - 2 sets - 5 hold - 1x daily - 7x weekly  Supine Transversus Abdominis Bracing - Hands on Stomach - 10 reps - 2 sets - 5 hold - 1x daily - 7x weekly          PT Education - 11/11/18  1249    Education Details  goals, POC, HEP    Person(s) Educated  Patient    Methods  Explanation;Demonstration;Tactile cues;Verbal cues    Comprehension  Verbalized understanding;Returned demonstration;Verbal cues required;Tactile cues required;Need further instruction       PT Short Term Goals - 11/11/18 1523      PT SHORT TERM GOAL #1   Title  Patient will be independent in home exercise program to improve strength/mobility for better functional independence with  ADLs.    Baseline  1/9: HEP given     Time  2    Period  Weeks    Status  New    Target Date  11/25/18      PT SHORT TERM GOAL #2   Title  Patient will report a worst pain of 5/10 on VAS in low back to improve tolerance with ADLs and reduced symptoms with activities.     Baseline  1/9: 7/10     Time  2    Period  Weeks    Status  New    Target Date  11/25/18        PT Long Term Goals - 11/11/18 1526      PT LONG TERM GOAL #1   Title  Patient will report a worst pain of 3/10 on VAS in low back to improve tolerance with ADLs and reduced symptoms with activities.     Baseline  1/9; 7/10     Time  4    Period  Days    Status  New    Target Date  12/09/18      PT LONG TERM GOAL #2   Title   Patient will reduce modified Oswestry score to <20 as to demonstrate minimal disability with ADLs including improved sleeping tolerance, walking/sitting tolerance etc for better mobility with ADLs.     Baseline  1/9: 28 % (mod)    Time  4    Period  Weeks    Status  New    Target Date  12/09/18      PT LONG TERM GOAL #3   Title  Patient (> 28 years old) will complete five times sit to stand test in < 15 seconds indicating an increased LE strength and improved balance.    Baseline  1/9: 19 seconds with hands on knees (painful)     Time  4    Period  Weeks    Status  New    Target Date  12/09/18      PT LONG TERM GOAL #4   Title  Patient will increase lumbar postural musculature and core to 4-/5 to increase posture in  seated and standing and return to exercises in exercise classes without pain.     Baseline  1/9: 2+/5 lumbar and core    Time  4    Period  Weeks    Status  New    Target Date  12/09/18             Plan - 11/11/18 1513    Clinical Impression Statement  Patient is a pleasant 73 year old female who presents for low back pain. Patient has a history of memory loss and has some cognitive deficits requiring additional time for history due to encephalitis two years ago. Patient's pain region is consistent with imaging at L4-5 and L5-S1 degeneration. Patient has increased pain with flexion, and is limited in her ability to perform dynamic exercises. No radicular symptoms elicited during evaluation and high muscle tone/limited muscle tissue length of paraspinals and hip flexors indicating potential muscular component of pain. Patient additionally has weak core and postural musculature, an HEP was prescribed and patient demonstrated understanding. Patient will benefit from skilled physical therapy to decrease pain, increase postural strength, and improve quality of life.     History and Personal Factors relevant to plan of care:  This patient presents with 3, personal factors/ comorbidities and 3 body elements including body structures and functions,  activity limitations and or participation restrictions. Patient's condition is  Evolving.    Clinical Presentation  Evolving    Clinical Presentation due to:  Pain varies throughout the day, concurrent cognitive/memory deficits.     Clinical Decision Making  Moderate    Rehab Potential  Good    Clinical Impairments Affecting Rehab Potential  (+) motivation, enjoys working out (-) history of back pain, memory deficits PMH: type II diabetes, thyroid disease (TSH), viral encephalitis, HLD, Insomnia, overweight, DDD lumbar, memory deficits.    PT Frequency  1x / week    PT Duration  4 weeks    PT Treatment/Interventions  ADLs/Self Care Home Management;Aquatic  Therapy;Biofeedback;Cryotherapy;Electrical Stimulation;Iontophoresis 4mg /ml Dexamethasone;Moist Heat;Traction;Ultrasound;Functional mobility training;Stair training;Gait training;Therapeutic activities;Therapeutic exercise;Neuromuscular re-education;Patient/family education;Manual techniques;Passive range of motion;Dry needling;Energy conservation;Taping    PT Next Visit Plan  review HEP, STM to paraspinals, core strengthening    PT Home Exercise Plan  see above    Recommended Other Services  n/a    Consulted and Agree with Plan of Care  Patient       Patient will benefit from skilled therapeutic intervention in order to improve the following deficits and impairments:  Abnormal gait, Decreased activity tolerance, Decreased endurance, Decreased range of motion, Decreased mobility, Decreased strength, Difficulty walking, Hypomobility, Increased fascial restricitons, Impaired flexibility, Impaired perceived functional ability, Increased muscle spasms, Improper body mechanics, Postural dysfunction, Pain, Obesity  Visit Diagnosis: Chronic midline low back pain without sciatica  Abnormal posture  Muscle weakness (generalized)     Problem List Patient Active Problem List   Diagnosis Date Noted  . Altered mental status   . Pyrexia   . Seizure ([place])   . Encephalitis   . Seizures ([place]) 04/24/2016  . Acute respiratory failure ([place]) 04/24/2016   Janna Arch, PT, DPT   11/11/2018, 3:43 PM  [place] MAIN [facility] SERVICES [address] [place], [place], [address] Phone: [phone]   Fax:  [phone]  Name: Cathy Harrington MRN: [id number] Date of Birth: 1946/10/12

## 2018-11-11 NOTE — Therapy (Signed)
[place] MAIN [facility] SERVICES [address] [place], [place], [address] Phone: [phone]   Fax:  [phone]  Speech Language Pathology Treatment/Re-Certification  Patient Details  Name: Cathy Harrington MRN: [id number] Date of Birth: 1946/09/03 Referring Provider (SLP): Anabel Bene   Encounter Date: 11/11/2018  End of Session - 11/11/18 1026    Visit Number  6    Number of Visits  10    Date for SLP Re-Evaluation  11/26/18    SLP Start Time  0900    SLP Stop Time   1000    SLP Time Calculation (min)  60 min    Activity Tolerance  Patient tolerated treatment well       Past Medical History:  Diagnosis Date  . Diabetes mellitus without complication ([place])   . Hypothyroidism   . Stroke ([place])    2017 1 week [facility] and 2 weeks [facility] 6 months rehab  . Thyroid disease     Past Surgical History:  Procedure Laterality Date  . BACK SURGERY    . BREAST SURGERY    . COLONOSCOPY    . COLONOSCOPY WITH PROPOFOL N/A 01/19/2018   Procedure: COLONOSCOPY WITH PROPOFOL;  Surgeon: Lollie Sails, MD;  Location: [facility] ENDOSCOPY;  Service: Endoscopy;  Laterality: N/A;  . parathyroidectomy      There were no vitals filed for this visit.  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  The patient received SLP services at [facility] at the time.  The patient is reporting difficulty remembering names and was referred for SLP for cognitive therapy.       Treatment Provided   Treatment provided  Cognitive-Linquistic      Pain Assessment   Pain Assessment  No/denies pain      Cognitive-Linquistic Treatment   Treatment  focused on  Cognition;Patient/family/caregiver education    Skilled Treatment  MEMORY:  Immediate memory- repeats 4 word with 75% accuracy.    Errors are due (in part) to hearing and/or auditory attention.  Short term memory- recall 4 pictures from last session, 4/4.  Recall associated object to object after guided visualization learning with 50% accuracy.  Learn 16 words, using sorting and remembering categories: 10/16 given max SLP assistance to rehearse/encode.  Follow 2-step, 4-component verbal commands with 90% accuracy.  Recall details of complex picture after verbal rehearsal (self-guided) with 50% accuracy. Patient has gotten a Public affairs consultant for ongoing notes/reminders.  She requires min cue to use effectively (write things you want to remember- for example where you parked).      Assessment / Recommendations / Plan   Plan  Continue with current plan of care      Progression Toward Goals   Progression toward goals  Progressing toward goals       SLP Education - 11/11/18 1025    Education Details  How to use visual imagery to aid memory/recall    Person(s) Educated  Patient    Methods  Explanation    Comprehension  Verbalized understanding         SLP Long Term Goals - 11/11/18 1029      SLP LONG  TERM GOAL #1   Title  Patient will complete complex memory activities with 80% accuracy.    Time  2    Period  Weeks    Status  Partially Met    Target Date  11/19/18      SLP LONG TERM GOAL #2   Title  Patient will demonstrate functional cognitive-communication skills for independent completion of personal responsibilities and leisure activities.    Time  2    Period  Weeks    Status  Partially Met    Target Date  11/26/18      SLP LONG TERM GOAL #3   Title  Patient will complete high level word finding activities with 80% accuracy.    Time  2    Period  Weeks    Status  Partially Met    Target Date  11/26/18       Plan - 11/11/18 1027    Clinical Impression Statement   The patient demonstrates immediate/short term/long term memory deficits.  Some errors are due to faulty hearing of verbal information, word finding errors, and frank memory impairment.  The patient has missed several appointments due to the holidays and conflicting appointments.  Will plan to continue ST X2 weeks with continued goals.    Speech Therapy Frequency  2x / week    Duration  Other (comment)   2 weeks   Treatment/Interventions  Language facilitation;Cognitive reorganization;Patient/family education    Potential to Achieve Goals  Good    Potential Considerations  Ability to learn/carryover information;Pain level;Family/community support;Co-morbidities;Previous level of function;Cooperation/participation level;Severity of impairments;Medical prognosis    SLP Home Exercise Plan  Have husband resume giving "homework"    Consulted and Agree with Plan of Care  Patient       Patient will benefit from skilled therapeutic intervention in order to improve the following deficits and impairments:   Cognitive communication deficit - Plan: SLP plan of care cert/re-cert    Problem List Patient Active Problem List   Diagnosis Date Noted  . Altered mental status   . Pyrexia   . Seizure ([place])   . Encephalitis   . Seizures ([place]) 04/24/2016  . Acute respiratory failure ([place]) 04/24/2016   [name], [name]  Lou Miner 11/11/2018, 10:32 AM  [place] MAIN [facility] SERVICES [address] [place], [place], [address] Phone: [phone]   Fax:  [phone]   Name: Cathy Harrington MRN: [id number] Date of Birth: [birth year]

## 2018-11-11 NOTE — Patient Instructions (Signed)
Access Code: [email or web address]  URL: [email or web address]  Date: 11/11/2018  Prepared by: Janna Arch   Exercises  Modified [place] - 2 reps - 2 sets - 30 hold - 1x daily - 7x weekly  Supine Posterior Pelvic Tilt - 10 reps - 2 sets - 5 hold - 1x daily - 7x weekly  Hooklying Lumbar Rotation - 10 reps - 2 sets - 5 hold - 1x daily - 7x weekly  Supine Transversus Abdominis Bracing - Hands on Stomach - 10 reps - 2 sets - 5 hold - 1x daily - 7x weekly

## 2018-11-16 ENCOUNTER — Encounter: Payer: Self-pay | Admitting: Speech Pathology

## 2018-11-16 ENCOUNTER — Ambulatory Visit: Payer: Medicare Other | Admitting: Speech Pathology

## 2018-11-16 DIAGNOSIS — R41841 Cognitive communication deficit: Secondary | ICD-10-CM

## 2018-11-16 NOTE — Therapy (Signed)
[place] MAIN [facility] SERVICES [address] [place], [place], [address] Phone: [phone]   Fax:  [phone]  Speech Language Pathology Treatment  Patient Details  Name: Cathy Harrington MRN: [id number] Date of Birth: [birth year] Referring Provider (SLP): Anabel Bene   Encounter Date: 11/16/2018  End of Session - 11/16/18 1103    Visit Number  7    Number of Visits  10    Date for SLP Re-Evaluation  11/26/18       Past Medical History:  Diagnosis Date  . Diabetes mellitus without complication ([place])   . Hypothyroidism   . Stroke ([place])    2017 1 week [facility] and 2 weeks [facility] 6 months rehab  . Thyroid disease     Past Surgical History:  Procedure Laterality Date  . BACK SURGERY    . BREAST SURGERY    . COLONOSCOPY    . COLONOSCOPY WITH PROPOFOL N/A 01/19/2018   Procedure: COLONOSCOPY WITH PROPOFOL;  Surgeon: Lollie Sails, MD;  Location: [facility] ENDOSCOPY;  Service: Endoscopy;  Laterality: N/A;  . parathyroidectomy      There were no vitals filed for this visit.  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  The patient received SLP services at [facility] at the time.  The patient is reporting difficulty remembering names and was referred for SLP for cognitive therapy.       Treatment Provided   Treatment provided  Cognitive-Linquistic      Pain Assessment   Pain Assessment  No/denies pain      Cognitive-Linquistic Treatment   Treatment focused on  Cognition;Patient/family/caregiver education    Skilled Treatment  MEMORY:  Immediate memory- repeats 4 word with 75% accuracy.    Errors are due (in part)  to hearing and/or auditory attention.  Short term memory- recall 4 pictures from last session, 4/4.  Learn 16 words, using sorting and remembering categories: 11/16 words learned over the past several sessions.  Follow 2-step, 4-component verbal commands with 70% accuracy.  Recall details of complex picture after verbal rehearsal (self-guided) with 90% accuracy. Patient has gotten a Public affairs consultant for ongoing notes/reminders.  She requires min cue to use effectively (write things you want to "mundane" remember- for example where you parked).  WORD FINDING: state item given definition with 75% accuracy (semantic and phonemic cues helpful).      Assessment / Recommendations / Plan   Plan  Continue with current plan of care      Progression Toward Goals   Progression toward goals  Progressing toward goals       SLP Education - 11/16/18 1103    Education Details  How to use visual imagery to aid memory/recall    Person(s) Educated  Patient    Methods  Explanation    Comprehension  Verbalized understanding         SLP Long Term Goals - 11/11/18 1029      SLP LONG TERM GOAL #1   Title  Patient will complete complex memory activities with 80% accuracy.    Time  2    Period  Weeks    Status  Partially Met    Target Date  11/19/18      SLP LONG TERM GOAL #2   Title  Patient will demonstrate functional cognitive-communication skills for independent completion of personal responsibilities and leisure activities.    Time  2    Period  Weeks    Status  Partially Met    Target Date  11/26/18      SLP LONG TERM GOAL #3   Title  Patient will complete high level word finding activities with 80% accuracy.    Time  2    Period  Weeks    Status  Partially Met    Target Date  11/26/18       Plan - 11/16/18 1104    Clinical Impression Statement  The patient demonstrates immediate/short term/long term memory deficits.  Some errors are due to faulty hearing of verbal information, word finding  errors, and frank memory impairment.  The patient is recalling information learned in previous sessions and is using written notes to recall functional information (people's names, restaurants)    Speech Therapy Frequency  2x / week    Duration  Other (comment)    Treatment/Interventions  Language facilitation;Cognitive reorganization;Patient/family education    Potential to Achieve Goals  Good    Potential Considerations  Ability to learn/carryover information;Pain level;Family/community support;Co-morbidities;Previous level of function;Cooperation/participation level;Severity of impairments;Medical prognosis    SLP Home Exercise Plan  Have husband resume giving "homework"    Consulted and Agree with Plan of Care  Patient       Patient will benefit from skilled therapeutic intervention in order to improve the following deficits and impairments:   Cognitive communication deficit    Problem List Patient Active Problem List   Diagnosis Date Noted  . Altered mental status   . Pyrexia   . Seizure ([place])   . Encephalitis   . Seizures ([place]) 04/24/2016  . Acute respiratory failure ([place]) 04/24/2016   [name], [name]  Lou Miner 11/16/2018, 11:04 AM  [place] MAIN [facility] SERVICES [address] [place], [place], [address] Phone: [phone]   Fax:  [phone]   Name: Cathy Harrington MRN: [id number] Date of Birth: 1946/09/10

## 2018-11-18 ENCOUNTER — Ambulatory Visit: Payer: Medicare Other

## 2018-11-18 ENCOUNTER — Encounter: Payer: Self-pay | Admitting: Speech Pathology

## 2018-11-18 ENCOUNTER — Ambulatory Visit: Payer: Medicare Other | Admitting: Speech Pathology

## 2018-11-18 DIAGNOSIS — G8929 Other chronic pain: Secondary | ICD-10-CM

## 2018-11-18 DIAGNOSIS — R41841 Cognitive communication deficit: Secondary | ICD-10-CM

## 2018-11-18 DIAGNOSIS — R293 Abnormal posture: Secondary | ICD-10-CM

## 2018-11-18 DIAGNOSIS — M545 Low back pain, unspecified: Secondary | ICD-10-CM

## 2018-11-18 DIAGNOSIS — M6281 Muscle weakness (generalized): Secondary | ICD-10-CM

## 2018-11-18 NOTE — Therapy (Signed)
[place] MAIN [facility] SERVICES [address] [place], [place], [address] Phone: [phone]   Fax:  [phone]  Physical Therapy Treatment  Patient Details  Name: Cathy Harrington MRN: [id number] Date of Birth: [birth year] Referring Provider (PT): Dr. Danae Orleans    Encounter Date: 11/18/2018  PT End of Session - 11/18/18 1107    Visit Number  2    Number of Visits  4    Date for PT Re-Evaluation  12/09/18    Authorization Type  2/10 eval 11/11/18    PT Start Time  1015    PT Stop Time  1100    PT Time Calculation (min)  45 min    Activity Tolerance  Patient tolerated treatment well    Behavior During Therapy  [facility] for tasks assessed/performed       Past Medical History:  Diagnosis Date  . Diabetes mellitus without complication ([place])   . Hypothyroidism   . Stroke ([place])    2017 1 week [facility] and 2 weeks [facility] 6 months rehab  . Thyroid disease     Past Surgical History:  Procedure Laterality Date  . BACK SURGERY    . BREAST SURGERY    . COLONOSCOPY    . COLONOSCOPY WITH PROPOFOL N/A 01/19/2018   Procedure: COLONOSCOPY WITH PROPOFOL;  Surgeon: Lollie Sails, MD;  Location: [facility] ENDOSCOPY;  Service: Endoscopy;  Laterality: N/A;  . parathyroidectomy      There were no vitals filed for this visit.  Subjective Assessment - 11/18/18 1105    Subjective   Patient presents with higher pain than at eval, stating its normal for it to go in waves. Patient's pain this morning was a 5-6/10 and when came in was about a 4/10.  Patient reports compliance with HEP.     Pertinent History  Patient is a pleasant 73 year old woman who is referred for acute midline low back pain without sciatica. Patient has a history of type II diabetes, thyroid disease (TSH), viral encephalitis, HLD, Insomnia, overweight, DDD lumbar, memory deficits. Pt. had lumbar herniated disk surgery multiple years ago (about 10) . Patient uses voltaren gel 4x a day for her back pain  and has hand imaging which showed mild grade 1 L3 and L4 anterolisthesis, with multiple intervertebral disc space narrowing endplate osteophytosis and facet arthrosis greatest at L4-5 and L5-S1 indicating multilevel lumbar spine degenerative disc disease.  Patient has had acute pain for the past couple weeks, potentially due to new exercises done at senior center which she has stopped for the time being. Pain is localized to lower back with no leg symptoms.     Limitations  Reading;Lifting;Standing;Walking;House hold activities;Other (comment)    How long can you sit comfortably?  no limit    How long can you stand comfortably?  right away     How long can you walk comfortably?  walks in mall but is painful     Diagnostic tests  x ray: mild grade 1 L3 and L4 anterolisthesis, with multiple intervertebral disc space narrowing endplate osteophytosis and facet arthrosis greatest at L4-5 and L5-S1 indicating multilevel lumbar spine degenerative disc disease.    Patient Stated Goals  decrease back pain     Currently in Pain?  Yes    Pain Score  4     Pain Location  Back    Pain Orientation  Lower;Posterior    Pain Descriptors / Indicators  Aching;Stabbing    Pain Type  Chronic pain    Pain Onset  More than a month ago    Pain Frequency  Intermittent    Aggravating Factors   leaning forward walking    Pain Relieving Factors  laying down           Manual:  STM lumbar paraspinals with focus on right lower quadratus musculature, implementing elements of effleurage and ptrissage for muscle tissue lengthening and relaxation. 5 minutes  Roller to L and R piriformis musculature for reduction of pain in LE's 3 minutes, tender initially  CPAs and UPAs grade I-II mobilizations for pain relief to lumbar spine, L5 L UPA pain reduction 10 seconds each level x 3   TherEx Nerve glides with leg on PT shoulder (RLE) 20x df/pf glides with guidance for body mechanics and velocity  Supine piriformis stretch  with PT overpressure RLE 60 second holds  Seated: piriformis stretch figure four 60 seconds each LE, piriformis stretch opp knee to shoulder 60 second holds  Seated hamstring stretch 30 seconds each LE, verbal cueing for keeping back flat for maximal hamstring stretch, toes towards nose.   Seated scapular retractions 10x 3 second holds, initially require tactile cueing, for upright posture   Review HEP  Exercises   Modified Thomas Stretch - 2 reps - 2 sets - 30 hold - 1x daily - 7x weekly   Supine Posterior Pelvic Tilt - 10 reps - 2 sets - 5 hold - 1x daily - 7x weekly   Hooklying Lumbar Rotation - 10 reps - 2 sets - 5 hold - 1x daily - 7x weekly  Supine Transversus Abdominis Bracing - Hands on Stomach - 10 reps - 2 sets - 5 hold - 1x daily - 7x weekly                     PT Education - 11/18/18 1106    Education Details  HEP compliance and correction, exercise technique, manual     Person(s) Educated  Patient    Methods  Explanation;Demonstration;Tactile cues;Verbal cues    Comprehension  Verbalized understanding;Returned demonstration;Tactile cues required;Need further instruction;Verbal cues required       PT Short Term Goals - 11/11/18 1523      PT SHORT TERM GOAL #1   Title  Patient will be independent in home exercise program to improve strength/mobility for better functional independence with ADLs.    Baseline  1/9: HEP given     Time  2    Period  Weeks    Status  New    Target Date  11/25/18      PT SHORT TERM GOAL #2   Title  Patient will report a worst pain of 5/10 on VAS in low back to improve tolerance with ADLs and reduced symptoms with activities.     Baseline  1/9: 7/10     Time  2    Period  Weeks    Status  New    Target Date  11/25/18        PT Long Term Goals - 11/11/18 1526      PT LONG TERM GOAL #1   Title  Patient will report a worst pain of 3/10 on VAS in low back to improve tolerance with ADLs and reduced symptoms with  activities.     Baseline  1/9; 7/10     Time  4    Period  Days    Status  New    Target Date  12/09/18  PT LONG TERM GOAL #2   Title   Patient will reduce modified Oswestry score to <20 as to demonstrate minimal disability with ADLs including improved sleeping tolerance, walking/sitting tolerance etc for better mobility with ADLs.     Baseline  1/9: 28 % (mod)    Time  4    Period  Weeks    Status  New    Target Date  12/09/18      PT LONG TERM GOAL #3   Title  Patient (> 56 years old) will complete five times sit to stand test in < 15 seconds indicating an increased LE strength and improved balance.    Baseline  1/9: 19 seconds with hands on knees (painful)     Time  4    Period  Weeks    Status  New    Target Date  12/09/18      PT LONG TERM GOAL #4   Title  Patient will increase lumbar postural musculature and core to 4-/5 to increase posture in seated and standing and return to exercises in exercise classes without pain.     Baseline  1/9: 2+/5 lumbar and core    Time  4    Period  Weeks    Status  New    Target Date  12/09/18            Plan - 11/18/18 1109    Clinical Impression Statement  Patient presents to therapy session with good motivation despite higher pain levels. Her pain was reduced with manual and therex to 2/10 by end of session. Patient has noted limited muscle tissue length of paraspinals and piriformis musculature that was reduced with prolonged holds and soft tissue work.  Home exercises were reviewed with minor corrections needed. Patient will benefit from skilled physical therapy to decrease pain, increase postural strength, and improve quality of life.     Rehab Potential  Good    Clinical Impairments Affecting Rehab Potential  (+) motivation, enjoys working out (-) history of back pain, memory deficits PMH: type II diabetes, thyroid disease (TSH), viral encephalitis, HLD, Insomnia, overweight, DDD lumbar, memory deficits.    PT Frequency  1x /  week    PT Duration  4 weeks    PT Treatment/Interventions  ADLs/Self Care Home Management;Aquatic Therapy;Biofeedback;Cryotherapy;Electrical Stimulation;Iontophoresis 4mg /ml Dexamethasone;Moist Heat;Traction;Ultrasound;Functional mobility training;Stair training;Gait training;Therapeutic activities;Therapeutic exercise;Neuromuscular re-education;Patient/family education;Manual techniques;Passive range of motion;Dry needling;Energy conservation;Taping    PT Next Visit Plan  STM to paraspinals, core strengthening, nerve glides, piriformis     PT Home Exercise Plan  see above    Consulted and Agree with Plan of Care  Patient       Patient will benefit from skilled therapeutic intervention in order to improve the following deficits and impairments:  Abnormal gait, Decreased activity tolerance, Decreased endurance, Decreased range of motion, Decreased mobility, Decreased strength, Difficulty walking, Hypomobility, Increased fascial restricitons, Impaired flexibility, Impaired perceived functional ability, Increased muscle spasms, Improper body mechanics, Postural dysfunction, Pain, Obesity  Visit Diagnosis: Chronic midline low back pain without sciatica  Abnormal posture  Muscle weakness (generalized)     Problem List Patient Active Problem List   Diagnosis Date Noted  . Altered mental status   . Pyrexia   . Seizure ([place])   . Encephalitis   . Seizures ([place]) 04/24/2016  . Acute respiratory failure ([place]) 04/24/2016   Janna Arch, PT, DPT   11/18/2018, 11:10 AM  [place] MAIN [facility] SERVICES [address]  [place], [place], [address] Phone: [phone]   Fax:  [phone]  Name: Cathy Harrington MRN: [id number] Date of Birth: April 09, 1946

## 2018-11-18 NOTE — Therapy (Signed)
[place] MAIN [facility] SERVICES [address] [place], [place], [address] Phone: [phone]   Fax:  [phone]  Speech Language Pathology Treatment  Patient Details  Name: Cathy Harrington MRN: [id number] Date of Birth: [birth year] Referring Provider (SLP): Anabel Bene   Encounter Date: 11/18/2018  End of Session - 11/18/18 1136    Visit Number  8    Number of Visits  10    Date for SLP Re-Evaluation  11/26/18    SLP Start Time  0900    SLP Stop Time   0950    SLP Time Calculation (min)  50 min    Activity Tolerance  Patient tolerated treatment well       Past Medical History:  Diagnosis Date  . Diabetes mellitus without complication ([place])   . Hypothyroidism   . Stroke ([place])    2017 1 week [facility] and 2 weeks [facility] 6 months rehab  . Thyroid disease     Past Surgical History:  Procedure Laterality Date  . BACK SURGERY    . BREAST SURGERY    . COLONOSCOPY    . COLONOSCOPY WITH PROPOFOL N/A 01/19/2018   Procedure: COLONOSCOPY WITH PROPOFOL;  Surgeon: Lollie Sails, MD;  Location: [facility] ENDOSCOPY;  Service: Endoscopy;  Laterality: N/A;  . parathyroidectomy      There were no vitals filed for this visit.  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  The patient received SLP services at [facility] at the time.  The patient is reporting difficulty remembering names and was referred for SLP for cognitive therapy.       Treatment Provided   Treatment provided  Cognitive-Linquistic      Pain Assessment   Pain Assessment  No/denies pain      Cognitive-Linquistic Treatment   Treatment focused on   Cognition;Patient/family/caregiver education    Skilled Treatment  MEMORY:  Immediate memory- repeats 4 word with 75% accuracy.    Errors are due (in part) to hearing and/or auditory attention.  Short term memory- recall 4 pictures from last session, 4/4.  Learn 16 words, using sorting and remembering categories: 11/16 words learned over the past several sessions.  Follow complex 2-step, 4-component verbal commands only when broken into 1-step, 2-component.  Recall details of complex picture after verbal rehearsal (self-guided) with 80% accuracy. Patient has gotten a Public affairs consultant for ongoing notes/reminders.  She requires min cue to use effectively (write things you want to "mundane" remember- for example where you parked).  WORD FINDING: state item given definition with 75% accuracy (semantic and phonemic cues helpful).      Assessment / Recommendations / Plan   Plan  Continue with current plan of care      Progression Toward Goals   Progression toward goals  Progressing toward goals       SLP Education - 11/18/18 1135    Education Details  How to use visual imagery to aid memory/recall    Person(s) Educated  Patient    Methods  Explanation    Comprehension  Verbalized understanding         SLP Long Term Goals - 11/11/18  [place] #1   Title  Patient will complete complex memory activities with 80% accuracy.    Time  2    Period  Weeks    Status  Partially Met    Target Date  11/19/18      SLP LONG TERM GOAL #2   Title  Patient will demonstrate functional cognitive-communication skills for independent completion of personal responsibilities and leisure activities.    Time  2    Period  Weeks    Status  Partially Met    Target Date  11/26/18      SLP LONG TERM GOAL #3   Title  Patient will complete high level word finding activities with 80% accuracy.    Time  2    Period  Weeks    Status  Partially Met    Target Date  11/26/18       Plan - 11/18/18  1137    Clinical Impression Statement  The patient demonstrates immediate/short term/long term memory deficits.  Some errors are due to faulty hearing of verbal information, word finding errors, and frank memory impairment.  The patient is recalling information learned in previous sessions and is using written notes to recall functional information (people's names, restaurants)    Speech Therapy Frequency  2x / week    Duration  Other (comment)    Treatment/Interventions  Language facilitation;Cognitive reorganization;Patient/family education    Potential to Achieve Goals  Good    Potential Considerations  Ability to learn/carryover information;Pain level;Family/community support;Co-morbidities;Previous level of function;Cooperation/participation level;Severity of impairments;Medical prognosis    SLP Home Exercise Plan  Have husband resume giving "homework"    Consulted and Agree with Plan of Care  Patient       Patient will benefit from skilled therapeutic intervention in order to improve the following deficits and impairments:   Cognitive communication deficit    Problem List Patient Active Problem List   Diagnosis Date Noted  . Altered mental status   . Pyrexia   . Seizure ([place])   . Encephalitis   . Seizures ([place]) 04/24/2016  . Acute respiratory failure ([place]) 04/24/2016   [name], [name]  Lou Miner 11/18/2018, 11:37 AM  [place] MAIN [facility] SERVICES [address] [place], [place], [address] Phone: [phone]   Fax:  [phone]   Name: Cathy Harrington MRN: [id number] Date of Birth: Jul 23, 1946

## 2018-11-23 ENCOUNTER — Ambulatory Visit: Payer: Medicare Other | Admitting: Speech Pathology

## 2018-11-23 ENCOUNTER — Encounter: Payer: Self-pay | Admitting: Speech Pathology

## 2018-11-23 ENCOUNTER — Ambulatory Visit: Payer: Medicare Other | Admitting: Physical Therapy

## 2018-11-23 ENCOUNTER — Encounter: Payer: Self-pay | Admitting: Physical Therapy

## 2018-11-23 DIAGNOSIS — M545 Low back pain: Principal | ICD-10-CM

## 2018-11-23 DIAGNOSIS — R41841 Cognitive communication deficit: Secondary | ICD-10-CM | POA: Diagnosis not present

## 2018-11-23 DIAGNOSIS — M6281 Muscle weakness (generalized): Secondary | ICD-10-CM

## 2018-11-23 DIAGNOSIS — G8929 Other chronic pain: Secondary | ICD-10-CM

## 2018-11-23 DIAGNOSIS — R293 Abnormal posture: Secondary | ICD-10-CM

## 2018-11-23 NOTE — Therapy (Signed)
[place] MAIN [facility] SERVICES [address] [place], [place], [address] Phone: [phone]   Fax:  [phone]  Speech Language Pathology Treatment/Discharge Summary  Patient Details  Name: Cathy Harrington MRN: [id number] Date of Birth: 09/12/1946 Referring Provider (SLP): Anabel Bene   Encounter Date: 11/23/2018  End of Session - 11/23/18 1228    Visit Number  9    Number of Visits  10    Date for SLP Re-Evaluation  11/26/18    SLP Start Time  0950    SLP Stop Time   9381    SLP Time Calculation (min)  55 min    Activity Tolerance  Patient tolerated treatment well       Past Medical History:  Diagnosis Date  . Diabetes mellitus without complication ([place])   . Hypothyroidism   . Stroke ([place])    2017 1 week [facility] and 2 weeks [facility] 6 months rehab  . Thyroid disease     Past Surgical History:  Procedure Laterality Date  . BACK SURGERY    . BREAST SURGERY    . COLONOSCOPY    . COLONOSCOPY WITH PROPOFOL N/A 01/19/2018   Procedure: COLONOSCOPY WITH PROPOFOL;  Surgeon: Lollie Sails, MD;  Location: [facility] ENDOSCOPY;  Service: Endoscopy;  Laterality: N/A;  . parathyroidectomy      There were no vitals filed for this visit.  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  The patient received SLP services at [facility] at the time.  The patient is reporting difficulty remembering names and was referred for SLP for cognitive therapy.       Treatment Provided   Treatment provided  Cognitive-Linquistic      Pain Assessment   Pain Assessment  No/denies pain      Cognitive-Linquistic  Treatment   Treatment focused on  Cognition;Patient/family/caregiver education    Skilled Treatment  MEMORY:  Immediate memory- repeats 4 word with 75% accuracy.    Errors are due (in part) to hearing and/or auditory attention.  Short term memory- recall 4 pictures from last session, 4/4.  Learn 16 words, using sorting and remembering categories: 11/16 words learned over the past several sessions.  Follow complex 2-step, 4-component verbal commands only when broken into 1-step, 2-component.  Recall details of complex picture after verbal rehearsal (self-guided) with 80% accuracy. Patient has gotten a Public affairs consultant for ongoing notes/reminders.  WORD FINDING: state item given definition with 80% accuracy (semantic and phonemic cues helpful).  Answer semantic feature questions RE: common object with 80% accuracy.  Name obscure pictured verbs with 80% accuracy.      Assessment / Recommendations / Plan   Plan  Continue with current plan of care      Progression Toward Goals   Progression toward goals  Progressing toward goals       SLP Education - 11/23/18 1228    Education Details  functional memory and word finding strategies    Person(s) Educated  Patient    Methods  Explanation    Comprehension  Verbalized understanding         SLP Long  Term Goals - 11/23/18 1229      SLP LONG TERM GOAL #1   Title  Patient will complete complex memory activities with 80% accuracy.    Status  Partially Met      SLP LONG TERM GOAL #2   Title  Patient will demonstrate functional cognitive-communication skills for independent completion of personal responsibilities and leisure activities.    Status  Achieved      SLP LONG TERM GOAL #3   Title  Patient will complete high level word finding activities with 80% accuracy.    Status  Achieved       Plan - 11/23/18 1229    Clinical Impression Statement  The patient demonstrates immediate/short term/long term memory deficits.  Some errors are due to  faulty hearing of verbal information, word finding errors, and frank memory impairment.  The patient is recalling information learned in previous sessions and is using written notes to recall functional information (people's names, restaurants).  She is able to complete her personal responsibilities independently.  Will discharge from speech therapy with all goals met.    Speech Therapy Frequency  Other (comment)   Discharge   Treatment/Interventions  Language facilitation;Cognitive reorganization;Patient/family education    Potential to Achieve Goals  Good    Potential Considerations  Ability to learn/carryover information;Pain level;Family/community support;Co-morbidities;Previous level of function;Cooperation/participation level;Severity of impairments;Medical prognosis    Consulted and Agree with Plan of Care  Patient       Patient will benefit from skilled therapeutic intervention in order to improve the following deficits and impairments:   Cognitive communication deficit    Problem List Patient Active Problem List   Diagnosis Date Noted  . Altered mental status   . Pyrexia   . Seizure ([place])   . Encephalitis   . Seizures ([place]) 04/24/2016  . Acute respiratory failure ([place]) 04/24/2016   Cathy Sea, [name]  Lou Miner 11/23/2018, 12:30 PM  [place] MAIN [facility] SERVICES [address] [place], [place], [address] Phone: [phone]   Fax:  [phone]   Name: Cathy Harrington MRN: [id number] Date of Birth: Sep 13, 1946

## 2018-11-23 NOTE — Therapy (Signed)
[place] MAIN [facility] SERVICES [address] [place], [place], [address] Phone: [phone]   Fax:  [phone]  Physical Therapy Treatment  Patient Details  Name: Cathy Harrington MRN: [id number] Date of Birth: June 08, 1946 Referring Provider (PT): Dr. Danae Orleans    Encounter Date: 11/23/2018  PT End of Session - 11/23/18 0902    Visit Number  3    Number of Visits  4    Date for PT Re-Evaluation  12/09/18    Authorization Type  3/10 eval 11/11/18    PT Start Time  0855    PT Stop Time  0935    PT Time Calculation (min)  40 min    Activity Tolerance  Patient tolerated treatment well    Behavior During Therapy  [facility] for tasks assessed/performed       Past Medical History:  Diagnosis Date  . Diabetes mellitus without complication ([place])   . Hypothyroidism   . Stroke ([place])    2017 1 week [facility] and 2 weeks [facility] 6 months rehab  . Thyroid disease     Past Surgical History:  Procedure Laterality Date  . BACK SURGERY    . BREAST SURGERY    . COLONOSCOPY    . COLONOSCOPY WITH PROPOFOL N/A 01/19/2018   Procedure: COLONOSCOPY WITH PROPOFOL;  Surgeon: Lollie Sails, MD;  Location: [facility] ENDOSCOPY;  Service: Endoscopy;  Laterality: N/A;  . parathyroidectomy      There were no vitals filed for this visit.  Subjective Assessment - 11/23/18 0900    Subjective  Patient is having 5 /10 to low back and she feels like it is getting better.     Pertinent History  Patient is a pleasant 73 year old woman who is referred for acute midline low back pain without sciatica. Patient has a history of type II diabetes, thyroid disease (TSH), viral encephalitis, HLD, Insomnia, overweight, DDD lumbar, memory deficits. Pt. had lumbar herniated disk surgery multiple years ago (about 24) . Patient uses voltaren gel 4x a day for her back pain and has hand imaging which showed mild grade 1 L3 and L4 anterolisthesis, with multiple intervertebral disc space narrowing  endplate osteophytosis and facet arthrosis greatest at L4-5 and L5-S1 indicating multilevel lumbar spine degenerative disc disease.  Patient has had acute pain for the past couple weeks, potentially due to new exercises done at senior center which she has stopped for the time being. Pain is localized to lower back with no leg symptoms.     Limitations  Reading;Lifting;Standing;Walking;House hold activities;Other (comment)    How long can you sit comfortably?  no limit    How long can you stand comfortably?  right away     How long can you walk comfortably?  walks in mall but is painful     Diagnostic tests  x ray: mild grade 1 L3 and L4 anterolisthesis, with multiple intervertebral disc space narrowing endplate osteophytosis and facet arthrosis greatest at L4-5 and L5-S1 indicating multilevel lumbar spine degenerative disc disease.    Patient Stated Goals  decrease back pain     Pain Onset  More than a month ago       Treatment: E-stim to lumbar spine L4-5 crossed pattern, IFC , 4000 Hz, frequency 80/150 Hz x 20 mins , intensity 17.5   Therapeutic exercise Single knee to chest stretch x 30 sec x 3 BLE hooklying with TA ER/abd x 20 with GTB Hooklying with TA and marching x 20  Trunk rotation left and right x 20  Hamstring stretch x 30 sec x 3 BLE Patient reports 2/10 pain following treatment to low back Patient required min-moderate verbal/tactile cues for correct exercise technique                        PT Education - 11/23/18 0901    Education Details  HEP    Person(s) Educated  Patient    Methods  Explanation;Demonstration;Verbal cues    Comprehension  Verbalized understanding;Returned demonstration;Need further instruction       PT Short Term Goals - 11/11/18 1523      PT SHORT TERM GOAL #1   Title  Patient will be independent in home exercise program to improve strength/mobility for better functional independence with ADLs.    Baseline  1/9: HEP given      Time  2    Period  Weeks    Status  New    Target Date  11/25/18      PT SHORT TERM GOAL #2   Title  Patient will report a worst pain of 5/10 on VAS in low back to improve tolerance with ADLs and reduced symptoms with activities.     Baseline  1/9: 7/10     Time  2    Period  Weeks    Status  New    Target Date  11/25/18        PT Long Term Goals - 11/11/18 1526      PT LONG TERM GOAL #1   Title  Patient will report a worst pain of 3/10 on VAS in low back to improve tolerance with ADLs and reduced symptoms with activities.     Baseline  1/9; 7/10     Time  4    Period  Days    Status  New    Target Date  12/09/18      PT LONG TERM GOAL #2   Title   Patient will reduce modified Oswestry score to <20 as to demonstrate minimal disability with ADLs including improved sleeping tolerance, walking/sitting tolerance etc for better mobility with ADLs.     Baseline  1/9: 28 % (mod)    Time  4    Period  Weeks    Status  New    Target Date  12/09/18      PT LONG TERM GOAL #3   Title  Patient (> 13 years old) will complete five times sit to stand test in < 15 seconds indicating an increased LE strength and improved balance.    Baseline  1/9: 19 seconds with hands on knees (painful)     Time  4    Period  Weeks    Status  New    Target Date  12/09/18      PT LONG TERM GOAL #4   Title  Patient will increase lumbar postural musculature and core to 4-/5 to increase posture in seated and standing and return to exercises in exercise classes without pain.     Baseline  1/9: 2+/5 lumbar and core    Time  4    Period  Weeks    Status  New    Target Date  12/09/18            Plan - 11/23/18 1607    Clinical Impression Statement  Patient reports that she is going to [facility] a group exercise program and her back pain is  5/10. She responds to e-stim and heat followed by stretching and core strengthening. She will conitnue to benefit from skilled PT to decrease her pain and  improve her mobility.     Rehab Potential  Good    Clinical Impairments Affecting Rehab Potential  (+) motivation, enjoys working out (-) history of back pain, memory deficits PMH: type II diabetes, thyroid disease (TSH), viral encephalitis, HLD, Insomnia, overweight, DDD lumbar, memory deficits.    PT Frequency  1x / week    PT Duration  4 weeks    PT Treatment/Interventions  ADLs/Self Care Home Management;Aquatic Therapy;Biofeedback;Cryotherapy;Electrical Stimulation;Iontophoresis 4mg /ml Dexamethasone;Moist Heat;Traction;Ultrasound;Functional mobility training;Stair training;Gait training;Therapeutic activities;Therapeutic exercise;Neuromuscular re-education;Patient/family education;Manual techniques;Passive range of motion;Dry needling;Energy conservation;Taping    PT Next Visit Plan  STM to paraspinals, core strengthening, nerve glides, piriformis     PT Home Exercise Plan  see above    Consulted and Agree with Plan of Care  Patient       Patient will benefit from skilled therapeutic intervention in order to improve the following deficits and impairments:  Abnormal gait, Decreased activity tolerance, Decreased endurance, Decreased range of motion, Decreased mobility, Decreased strength, Difficulty walking, Hypomobility, Increased fascial restricitons, Impaired flexibility, Impaired perceived functional ability, Increased muscle spasms, Improper body mechanics, Postural dysfunction, Pain, Obesity  Visit Diagnosis: Chronic midline low back pain without sciatica  Abnormal posture  Muscle weakness (generalized)     Problem List Patient Active Problem List   Diagnosis Date Noted  . Altered mental status   . Pyrexia   . Seizure ([place])   . Encephalitis   . Seizures ([place]) 04/24/2016  . Acute respiratory failure ([place]) 04/24/2016    Arelia Sneddon S,PT DPT 11/23/2018, 9:13 AM  [place] MAIN [facility] SERVICES [address] [place], [place],  [address] Phone: [phone]   Fax:  [phone]  Name: Cathy Harrington MRN: [id number] Date of Birth: [birth year]

## 2018-11-25 ENCOUNTER — Ambulatory Visit: Payer: Medicare Other | Admitting: Speech Pathology

## 2018-11-30 ENCOUNTER — Ambulatory Visit: Payer: Medicare Other | Admitting: Speech Pathology

## 2018-12-02 ENCOUNTER — Ambulatory Visit: Payer: Medicare Other

## 2018-12-02 ENCOUNTER — Ambulatory Visit: Payer: Medicare Other | Admitting: Speech Pathology

## 2018-12-02 DIAGNOSIS — G8929 Other chronic pain: Secondary | ICD-10-CM

## 2018-12-02 DIAGNOSIS — M6281 Muscle weakness (generalized): Secondary | ICD-10-CM

## 2018-12-02 DIAGNOSIS — R293 Abnormal posture: Secondary | ICD-10-CM

## 2018-12-02 DIAGNOSIS — M545 Low back pain: Principal | ICD-10-CM

## 2018-12-02 DIAGNOSIS — R41841 Cognitive communication deficit: Secondary | ICD-10-CM

## 2018-12-02 NOTE — Therapy (Signed)
[place] MAIN [facility] SERVICES [address] [place], [place], [address] Phone: [phone]   Fax:  [phone]  Physical Therapy Treatment  Patient Details  Name: Cathy Harrington MRN: [id number] Date of Birth: Jul 23, 1946 Referring Provider (PT): Dr. Danae Orleans    Encounter Date: 12/02/2018  PT End of Session - 12/02/18 0852    Visit Number  4    Number of Visits  4    Date for PT Re-Evaluation  12/09/18    Authorization Type  3/10 eval 11/11/18    PT Start Time  0845    PT Stop Time  0925    PT Time Calculation (min)  40 min    Activity Tolerance  Patient tolerated treatment well    Behavior During Therapy  [facility] for tasks assessed/performed       Past Medical History:  Diagnosis Date  . Diabetes mellitus without complication ([place])   . Hypothyroidism   . Stroke ([place])    2017 1 week [facility] and 2 weeks [facility] 6 months rehab  . Thyroid disease     Past Surgical History:  Procedure Laterality Date  . BACK SURGERY    . BREAST SURGERY    . COLONOSCOPY    . COLONOSCOPY WITH PROPOFOL N/A 01/19/2018   Procedure: COLONOSCOPY WITH PROPOFOL;  Surgeon: Lollie Sails, MD;  Location: [facility] ENDOSCOPY;  Service: Endoscopy;  Laterality: N/A;  . parathyroidectomy      There were no vitals filed for this visit.  Subjective Assessment - 12/02/18 0849    Subjective  Pt doing well today. Reports back still bothering her at times. She says the topic medication helps and moving around helps her as well. She thinks he HEP is helping to some degree.    Pertinent History  Patient is a pleasant 73 year old woman who is referred for acute midline low back pain without sciatica. Patient has a history of type II diabetes, thyroid disease (TSH), viral encephalitis, HLD, Insomnia, overweight, DDD lumbar, memory deficits. Pt. had lumbar herniated disk surgery multiple years ago (about 11) . Patient uses voltaren gel 4x a day for her back pain and has hand imaging  which showed mild grade 1 L3 and L4 anterolisthesis, with multiple intervertebral disc space narrowing endplate osteophytosis and facet arthrosis greatest at L4-5 and L5-S1 indicating multilevel lumbar spine degenerative disc disease.  Patient has had acute pain for the past couple weeks, potentially due to new exercises done at senior center which she has stopped for the time being. Pain is localized to lower back with no leg symptoms.     How long can you sit comfortably?  no limit    How long can you stand comfortably?  right away     How long can you walk comfortably?  walks in mall but is painful     Diagnostic tests  x ray: mild grade 1 L3 and L4 anterolisthesis, with multiple intervertebral disc space narrowing endplate osteophytosis and facet arthrosis greatest at L4-5 and L5-S1 indicating multilevel lumbar spine degenerative disc disease.        Intervention this date: -SKTC stretch 3x30sec bilat, towel behind knee for UE assist -DTKC stretch 3x60sec, VC for belly breathing (knees/hips wide to avoid hip impingement)  -Hooklying FABER stretch -Glute max bridging 2x10 -Chair rotation stretch 2x30 sec -Reverse curl-up 2x10 (bilat marching) -Myofascial release x15 minutes [lumbar paraspinals, QL, glute medius, all bilat]       PT Short Term Goals -  11/11/18 1523      PT SHORT TERM GOAL #1   Title  Patient will be independent in home exercise program to improve strength/mobility for better functional independence with ADLs.    Baseline  1/9: HEP given     Time  2    Period  Weeks    Status  New    Target Date  11/25/18      PT SHORT TERM GOAL #2   Title  Patient will report a worst pain of 5/10 on VAS in low back to improve tolerance with ADLs and reduced symptoms with activities.     Baseline  1/9: 7/10     Time  2    Period  Weeks    Status  New    Target Date  11/25/18        PT Long Term Goals - 11/11/18 1526      PT LONG TERM GOAL #1   Title  Patient will  report a worst pain of 3/10 on VAS in low back to improve tolerance with ADLs and reduced symptoms with activities.     Baseline  1/9; 7/10     Time  4    Period  Days    Status  New    Target Date  12/09/18      PT LONG TERM GOAL #2   Title   Patient will reduce modified Oswestry score to <20 as to demonstrate minimal disability with ADLs including improved sleeping tolerance, walking/sitting tolerance etc for better mobility with ADLs.     Baseline  1/9: 28 % (mod)    Time  4    Period  Weeks    Status  New    Target Date  12/09/18      PT LONG TERM GOAL #3   Title  Patient (> 23 years old) will complete five times sit to stand test in < 15 seconds indicating an increased LE strength and improved balance.    Baseline  1/9: 19 seconds with hands on knees (painful)     Time  4    Period  Weeks    Status  New    Target Date  12/09/18      PT LONG TERM GOAL #4   Title  Patient will increase lumbar postural musculature and core to 4-/5 to increase posture in seated and standing and return to exercises in exercise classes without pain.     Baseline  1/9: 2+/5 lumbar and core    Time  4    Period  Weeks    Status  New    Target Date  12/09/18            Plan - 12/02/18 0853    Clinical Impression Statement  Pt continues to have intermitten tsoreness,particularly in mornings. Continued with streching and strengthening routine as in prior sessions. also conitnued with manual techniques as needed to address focal areas of muscle tightness and tenderness in paraspnal muscle tissue. Pt endorses improved pain at end of session. She elects to add in some of today's stretches into her routine at home as they help her feel btter.  Pt is not always consistent with her counting, holding 30 sec stretches for ~10 sec on average, hence a timer is used to ensure accuracy.    Rehab Potential  Good    Clinical Impairments Affecting Rehab Potential  (+) motivation, enjoys working out (-) history of  back pain, memory deficits PMH: type  II diabetes, thyroid disease (TSH), viral encephalitis, HLD, Insomnia, overweight, DDD lumbar, memory deficits.    PT Frequency  1x / week    PT Duration  4 weeks    PT Treatment/Interventions  ADLs/Self Care Home Management;Aquatic Therapy;Biofeedback;Cryotherapy;Electrical Stimulation;Iontophoresis 4mg /ml Dexamethasone;Moist Heat;Traction;Ultrasound;Functional mobility training;Stair training;Gait training;Therapeutic activities;Therapeutic exercise;Neuromuscular re-education;Patient/family education;Manual techniques;Passive range of motion;Dry needling;Energy conservation;Taping    PT Next Visit Plan  Reassessment and Re-cert as needed if more visits are warranted.     PT Home Exercise Plan  None added this date.     Consulted and Agree with Plan of Care  Patient       Patient will benefit from skilled therapeutic intervention in order to improve the following deficits and impairments:  Abnormal gait, Decreased activity tolerance, Decreased endurance, Decreased range of motion, Decreased mobility, Decreased strength, Difficulty walking, Hypomobility, Increased fascial restricitons, Impaired flexibility, Impaired perceived functional ability, Increased muscle spasms, Improper body mechanics, Postural dysfunction, Pain, Obesity  Visit Diagnosis: Chronic midline low back pain without sciatica  Abnormal posture  Muscle weakness (generalized)  Cognitive communication deficit     Problem List Patient Active Problem List   Diagnosis Date Noted  . Altered mental status   . Pyrexia   . Seizure ([place])   . Encephalitis   . Seizures ([place]) 04/24/2016  . Acute respiratory failure ([place]) 04/24/2016   9:06 AM, 12/02/18 Etta Grandchild, PT, DPT Physical Therapist - [facility]  Outpatient Physical [place] [phone]    Etta Grandchild 12/02/2018, 8:59 AM  [place] MAIN  [facility] SERVICES [address] [place], [place], [address] Phone: [phone]   Fax:  [phone]  Name: ROZELLE CAUDLE MRN: [id number] Date of Birth: 1946/06/11

## 2018-12-07 ENCOUNTER — Ambulatory Visit: Payer: Medicare Other | Admitting: Speech Pathology

## 2018-12-07 ENCOUNTER — Ambulatory Visit: Payer: Medicare Other | Attending: Neurology

## 2018-12-07 DIAGNOSIS — M545 Low back pain: Secondary | ICD-10-CM | POA: Insufficient documentation

## 2018-12-07 DIAGNOSIS — G8929 Other chronic pain: Secondary | ICD-10-CM

## 2018-12-07 DIAGNOSIS — R293 Abnormal posture: Secondary | ICD-10-CM | POA: Diagnosis present

## 2018-12-07 DIAGNOSIS — M6281 Muscle weakness (generalized): Secondary | ICD-10-CM | POA: Diagnosis present

## 2018-12-07 NOTE — Therapy (Signed)
[place] MAIN [facility] SERVICES [address] [place], [place], [address] Phone: [phone]   Fax:  [phone]  Physical Therapy Treatment/ recert 1x/DISCHARGE  Patient Details  Name: Cathy Harrington MRN: [id number] Date of Birth: 20-Nov-1945 Referring Provider (PT): Dr. Danae Orleans    Encounter Date: 12/07/2018  PT End of Session - 12/07/18 0914    Visit Number  5    Number of Visits  5    Date for PT Re-Evaluation  12/09/18    Authorization Type  4/10 eval 11/11/18    PT Start Time  0845    PT Stop Time  0917    PT Time Calculation (min)  32 min    Activity Tolerance  Patient tolerated treatment well    Behavior During Therapy  [facility] for tasks assessed/performed       Past Medical History:  Diagnosis Date  . Diabetes mellitus without complication ([place])   . Hypothyroidism   . Stroke ([place])    2017 1 week [facility] and 2 weeks [facility] 6 months rehab  . Thyroid disease     Past Surgical History:  Procedure Laterality Date  . BACK SURGERY    . BREAST SURGERY    . COLONOSCOPY    . COLONOSCOPY WITH PROPOFOL N/A 01/19/2018   Procedure: COLONOSCOPY WITH PROPOFOL;  Surgeon: Lollie Sails, MD;  Location: [facility] ENDOSCOPY;  Service: Endoscopy;  Laterality: N/A;  . parathyroidectomy      There were no vitals filed for this visit.  Subjective Assessment - 12/07/18 0850    Subjective  Patient reports her back is doing much better since the first session. Pain is manageable and only affects her in the morning. She is able to fix the pain/reduce the pain herself with stretches and topic medication.     Pertinent History  Patient is a pleasant 73 year old woman who is referred for acute midline low back pain without sciatica. Patient has a history of type II diabetes, thyroid disease (TSH), viral encephalitis, HLD, Insomnia, overweight, DDD lumbar, memory deficits. Pt. had lumbar herniated disk surgery multiple years ago (about 83) . Patient uses voltaren  gel 4x a day for her back pain and has hand imaging which showed mild grade 1 L3 and L4 anterolisthesis, with multiple intervertebral disc space narrowing endplate osteophytosis and facet arthrosis greatest at L4-5 and L5-S1 indicating multilevel lumbar spine degenerative disc disease.  Patient has had acute pain for the past couple weeks, potentially due to new exercises done at senior center which she has stopped for the time being. Pain is localized to lower back with no leg symptoms.     How long can you sit comfortably?  no limit    How long can you stand comfortably?  right away     How long can you walk comfortably?  walks in mall but is painful     Diagnostic tests  x ray: mild grade 1 L3 and L4 anterolisthesis, with multiple intervertebral disc space narrowing endplate osteophytosis and facet arthrosis greatest at L4-5 and L5-S1 indicating multilevel lumbar spine degenerative disc disease.    Currently in Pain?  No/denies        VAS: 3/10  MODI: 18%  5x STS: 13 seconds  Manual:  STM lumbar paraspinals with focus on right lower quadratus musculature, implementing elements of effleurage and ptrissage for muscle tissue lengthening and relaxation. 5 minutes   Roller to L and R piriformis musculature for reduction of pain  in LE's 3 minutes, tender initially   TherEx Nerve glides with leg on PT shoulder (RLE) 20x df/pf glides with guidance for body mechanics and velocity     Seated: piriformis stretch figure four 60 seconds each LE, piriformis stretch opp knee to shoulder 60 second holds x2 trials each LE   Seated hamstring stretch 60 seconds each LE, verbal cueing for keeping back flat for maximal hamstring stretch, toes towards nose. x2 trials each LE      One time cert due to patient being outside of [facility] number.    Patient returns for one time cert due to patient being outside of cert number. Patient appropriate for discharge due to pain relief and ability to self reduce pain  at home. Patient has met all goals at this time and is appropriate for discharge. I will be happy to see patient again in the future as needed.             PT Education - 12/07/18 0851    Education Details  d/c goals, HEP compliance    Person(s) Educated  Patient    Methods  Explanation;Demonstration;Tactile cues;Verbal cues    Comprehension  Verbalized understanding;Returned demonstration;Tactile cues required;Verbal cues required       PT Short Term Goals - 12/07/18 0854      PT SHORT TERM GOAL #1   Title  Patient will be independent in home exercise program to improve strength/mobility for better functional independence with ADLs.    Baseline  1/9: HEP given 2/4: HEP compliant    Time  2    Period  Weeks    Status  Achieved      PT SHORT TERM GOAL #2   Title  Patient will report a worst pain of 5/10 on VAS in low back to improve tolerance with ADLs and reduced symptoms with activities.     Baseline  1/9: 7/10 2/4: 3/10     Time  2    Period  Weeks    Status  Achieved        PT Long Term Goals - 12/07/18 7741      PT LONG TERM GOAL #1   Title  Patient will report a worst pain of 3/10 on VAS in low back to improve tolerance with ADLs and reduced symptoms with activities.     Baseline  1/9; 7/10 2/4: 3/10    Time  4    Period  Days    Status  Achieved      PT LONG TERM GOAL #2   Title   Patient will reduce modified Oswestry score to <20 as to demonstrate minimal disability with ADLs including improved sleeping tolerance, walking/sitting tolerance etc for better mobility with ADLs.     Baseline  1/9: 28 % (mod) 2/4 18%     Time  4    Period  Weeks    Status  Achieved      PT LONG TERM GOAL #3   Title  Patient (> 3 years old) will complete five times sit to stand test in < 15 seconds indicating an increased LE strength and improved balance.    Baseline  1/9: 19 seconds with hands on knees (painful) 2/4: 13 seconds    Time  4    Period  Weeks    Status   Achieved      PT LONG TERM GOAL #4   Title  Patient will increase lumbar postural musculature and core to 4-/5 to increase  posture in seated and standing and return to exercises in exercise classes without pain.     Baseline  1/9: 2+/5 lumbar and core 2/4: 4-/5 no pain     Time  4    Period  Weeks    Status  Achieved            Plan - 12/07/18 0915    Clinical Impression Statement  Patient returns for one time cert due to patient being outside of [place] number. Patient appropriate for discharge due to pain relief and ability to self reduce pain at home. Patient has met all goals at this time and is appropriate for discharge. I will be happy to see patient again in the future as needed.     Rehab Potential  Good    Clinical Impairments Affecting Rehab Potential  (+) motivation, enjoys working out (-) history of back pain, memory deficits PMH: type II diabetes, thyroid disease (TSH), viral encephalitis, HLD, Insomnia, overweight, DDD lumbar, memory deficits.    PT Frequency  One time visit    PT Duration  --    PT Treatment/Interventions  ADLs/Self Care Home Management;Aquatic Therapy;Biofeedback;Cryotherapy;Electrical Stimulation;Iontophoresis 57m/ml Dexamethasone;Moist Heat;Traction;Ultrasound;Functional mobility training;Stair training;Gait training;Therapeutic activities;Therapeutic exercise;Neuromuscular re-education;Patient/family education;Manual techniques;Passive range of motion;Dry needling;Energy conservation;Taping    PT Next Visit Plan  --    PT Home Exercise Plan  None added this date.     Consulted and Agree with Plan of Care  Patient       Patient will benefit from skilled therapeutic intervention in order to improve the following deficits and impairments:  Abnormal gait, Decreased activity tolerance, Decreased endurance, Decreased range of motion, Decreased mobility, Decreased strength, Difficulty walking, Hypomobility, Increased fascial restricitons, Impaired flexibility,  Impaired perceived functional ability, Increased muscle spasms, Improper body mechanics, Postural dysfunction, Pain, Obesity  Visit Diagnosis: Chronic midline low back pain without sciatica  Abnormal posture  Muscle weakness (generalized)     Problem List Patient Active Problem List   Diagnosis Date Noted  . Altered mental status   . Pyrexia   . Seizure ([place]   . Encephalitis   . Seizures ([place] 04/24/2016  . Acute respiratory failure ([place] 04/24/2016  MJanna Arch PT, DPT   12/07/2018, 9:17 AM  [place] [facility] [address] [place] [place] [address]Phone: [phone]  Fax:  [phone] Name: AMADELYNN MALSONMRN: [id number] of Birth: 1Dec 23, 1947

## 2018-12-09 ENCOUNTER — Ambulatory Visit: Payer: Medicare Other | Admitting: Speech Pathology

## 2019-05-07 ENCOUNTER — Emergency Department
Admission: EM | Admit: 2019-05-07 | Discharge: 2019-05-08 | Disposition: A | Discharge status: Other Acute Inpatient Hospital | Payer: Medicare Other | Attending: Pediatrics | Admitting: Pediatrics

## 2019-05-07 ENCOUNTER — Encounter: Payer: Self-pay | Admitting: Emergency Medicine

## 2019-05-07 ENCOUNTER — Other Ambulatory Visit: Payer: Self-pay

## 2019-05-07 ENCOUNTER — Emergency Department: Payer: Medicare Other

## 2019-05-07 DIAGNOSIS — Z87891 Personal history of nicotine dependence: Secondary | ICD-10-CM | POA: Diagnosis not present

## 2019-05-07 DIAGNOSIS — Z79899 Other long term (current) drug therapy: Secondary | ICD-10-CM | POA: Insufficient documentation

## 2019-05-07 DIAGNOSIS — Z7982 Long term (current) use of aspirin: Secondary | ICD-10-CM | POA: Insufficient documentation

## 2019-05-07 DIAGNOSIS — E039 Hypothyroidism, unspecified: Secondary | ICD-10-CM | POA: Diagnosis not present

## 2019-05-07 DIAGNOSIS — R569 Unspecified convulsions: Secondary | ICD-10-CM | POA: Diagnosis not present

## 2019-05-07 DIAGNOSIS — Z794 Long term (current) use of insulin: Secondary | ICD-10-CM | POA: Insufficient documentation

## 2019-05-07 DIAGNOSIS — Z20828 Contact with and (suspected) exposure to other viral communicable diseases: Secondary | ICD-10-CM | POA: Insufficient documentation

## 2019-05-07 DIAGNOSIS — E119 Type 2 diabetes mellitus without complications: Secondary | ICD-10-CM | POA: Insufficient documentation

## 2019-05-07 DIAGNOSIS — Z8673 Personal history of transient ischemic attack (TIA), and cerebral infarction without residual deficits: Secondary | ICD-10-CM | POA: Diagnosis not present

## 2019-05-07 DIAGNOSIS — R4702 Dysphasia: Secondary | ICD-10-CM | POA: Diagnosis present

## 2019-05-07 DIAGNOSIS — R479 Unspecified speech disturbances: Secondary | ICD-10-CM

## 2019-05-07 LAB — CBC WITH DIFFERENTIAL/PLATELET
Abs Immature Granulocytes: 0.02 10*3/uL (ref 0.00–0.07)
Basophils Absolute: 0 10*3/uL (ref 0.0–0.1)
Basophils Relative: 0 %
Eosinophils Absolute: 0.1 10*3/uL (ref 0.0–0.5)
Eosinophils Relative: 2 %
HCT: 40.7 % (ref 36.0–46.0)
Hemoglobin: 13.1 g/dL (ref 12.0–15.0)
Immature Granulocytes: 0 %
Lymphocytes Relative: 32 %
Lymphs Abs: 2.1 10*3/uL (ref 0.7–4.0)
MCH: 28.1 pg (ref 26.0–34.0)
MCHC: 32.2 g/dL (ref 30.0–36.0)
MCV: 87.3 fL (ref 80.0–100.0)
Monocytes Absolute: 0.8 10*3/uL (ref 0.1–1.0)
Monocytes Relative: 12 %
Neutro Abs: 3.6 10*3/uL (ref 1.7–7.7)
Neutrophils Relative %: 54 %
Platelets: 245 10*3/uL (ref 150–400)
RBC: 4.66 MIL/uL (ref 3.87–5.11)
RDW: 14.6 % (ref 11.5–15.5)
WBC: 6.7 10*3/uL (ref 4.0–10.5)
nRBC: 0 % (ref 0.0–0.2)

## 2019-05-07 LAB — PROTIME-INR
INR: 0.9 (ref 0.8–1.2)
Prothrombin Time: 11.7 seconds (ref 11.4–15.2)

## 2019-05-07 LAB — COMPREHENSIVE METABOLIC PANEL
ALT: 60 U/L — ABNORMAL HIGH (ref 0–44)
AST: 42 U/L — ABNORMAL HIGH (ref 15–41)
Albumin: 4.7 g/dL (ref 3.5–5.0)
Alkaline Phosphatase: 61 U/L (ref 38–126)
Anion gap: 10 (ref 5–15)
BUN: 15 mg/dL (ref 8–23)
CO2: 21 mmol/L — ABNORMAL LOW (ref 22–32)
Calcium: 9.5 mg/dL (ref 8.9–10.3)
Chloride: 111 mmol/L (ref 98–111)
Creatinine, Ser: 0.76 mg/dL (ref 0.44–1.00)
GFR calc Af Amer: >60 mL/min (ref >60)
GFR calc non Af Amer: >60 mL/min (ref >60)
Glucose, Bld: 110 mg/dL — ABNORMAL HIGH (ref 70–99)
Potassium: 4.1 mmol/L (ref 3.5–5.1)
Sodium: 142 mmol/L (ref 135–145)
Total Bilirubin: 0.5 mg/dL (ref 0.3–1.2)
Total Protein: 7.8 g/dL (ref 6.5–8.1)

## 2019-05-07 LAB — GLUCOSE, CAPILLARY: Glucose-Capillary: 104 mg/dL — ABNORMAL HIGH (ref 70–99)

## 2019-05-07 LAB — APTT: aPTT: 28 seconds (ref 24–36)

## 2019-05-07 MED ORDER — LORAZEPAM 2 MG/ML IJ SOLN
1.0000 mg | Freq: Once | INTRAMUSCULAR | Status: AC
  Administered 2019-05-07: 20:00:00 1 mg via INTRAVENOUS
  Filled 2019-05-07: qty 1

## 2019-05-07 MED ORDER — LORAZEPAM 2 MG/ML IJ SOLN
1.0000 mg | Freq: Once | INTRAMUSCULAR | Status: AC
  Administered 2019-05-07: 1 mg via INTRAVENOUS
  Filled 2019-05-07: qty 1

## 2019-05-07 MED ORDER — LEVETIRACETAM IN NACL 1000 MG/100ML IV SOLN
1000.0000 mg | Freq: Once | INTRAVENOUS | Status: AC
  Administered 2019-05-07: 1000 mg via INTRAVENOUS
  Filled 2019-05-07: qty 100

## 2019-05-07 MED ORDER — SODIUM CHLORIDE 0.9% FLUSH: 3.0000 mL | Freq: Once | INTRAVENOUS | Status: DC

## 2019-05-07 NOTE — ED Notes (Signed)
Up to bathroom in room with assistance, patient is unsteady when attempting to ambulate.

## 2019-05-07 NOTE — ED Notes (Signed)
Pt has visible twitching to the right side of her face, and right side facial droop. Pt also drooling at this time from right side of mouth.

## 2019-05-07 NOTE — ED Notes (Signed)
Awaiting bed assignment in transfer facility.  Patient is awake and alert. Still unable to understand most of what patient is saying.

## 2019-05-07 NOTE — ED Notes (Signed)
Resting quietly with eyes closed, no acute distress noted. °

## 2019-05-07 NOTE — ED Triage Notes (Signed)
Husband originally states jar fell and hit her on head and she could not speak after. However he was not there. No sign of head injury. Patient does not answer questions. States he saw her normal one hour ago.

## 2019-05-07 NOTE — ED Notes (Signed)
ED Provider at bedside. 

## 2019-05-07 NOTE — ED Provider Notes (Signed)
[facility] Emergency Department Provider Note   ____________________________________________   I have reviewed the triage vital signs and the nursing notes.   HISTORY  Chief Complaint Inability to speak  History limited by and level 5 caveat due to: Aphagia   HPI Cathy Harrington is a 73 y.o. female who presents to the emergency department today accompanied by husband because of concern for inability to speak. The patient was last seen normal roughly 1 hour prior to my examination. The patient's husband had been out of the room and when he returned she was not able to speak. He did notice a jar of rice apparently on the ground. He surmised that it hit the patient in the head. The patient herself cannot give any history.   Records reviewed. Per medical record review patient has a history of stroke  Past Medical History:  Diagnosis Date  . Diabetes mellitus without complication ([place])   . Hypothyroidism   . Stroke ([place])    2017 1 week [facility] and 2 weeks [facility] 6 months rehab  . Thyroid disease     Patient Active Problem List   Diagnosis Date Noted  . Altered mental status   . Pyrexia   . Seizure ([place])   . Encephalitis   . Seizures ([place]) 04/24/2016  . Acute respiratory failure ([place]) 04/24/2016    Past Surgical History:  Procedure Laterality Date  . BACK SURGERY    . BREAST SURGERY    . COLONOSCOPY    . COLONOSCOPY WITH PROPOFOL N/A 01/19/2018   Procedure: COLONOSCOPY WITH PROPOFOL;  Surgeon: Lollie Sails, MD;  Location[facility] ENDOSCOPY;  Service: Endoscopy;  Laterality: N/A;  . parathyroidectomy      Prior to Admission medications   Medication Sig Start Date End Date Taking? Authorizing Provider  acyclovir 800 mg in dextrose 5 % 150 mL Inject 800 mg into the vein every 8 (eight) hours. Patient not taking: Reported on 01/19/2018 05/02/16   Loletha Grayer, MD  aspirin EC 81 MG tablet Take 81 mg by mouth daily.    [provider]   enoxaparin (LOVENOX) 40 MG/0.4ML injection Inject 0.4 mLs (40 mg total) into the skin daily. Patient not taking: Reported on 01/19/2018 05/02/16   Loletha Grayer, MD  glipiZIDE (GLUCOTROL) 5 MG tablet Take by mouth daily before breakfast.    [provider]  insulin aspart (NOVOLOG) 100 UNIT/ML injection Inject 4 Units into the skin 3 (three) times daily with meals. Patient not taking: Reported on 01/19/2018 05/02/16   Loletha Grayer, MD  insulin glargine (LANTUS) 100 UNIT/ML injection Inject 0.1 mLs (10 Units total) into the skin at bedtime. Patient not taking: Reported on 01/19/2018 05/02/16   Loletha Grayer, MD  levETIRAcetam (KEPPRA) 500 MG tablet Take 1 tablet (500 mg total) by mouth 2 (two) times daily. Patient not taking: Reported on 01/19/2018 05/02/16   Loletha Grayer, MD  levothyroxine (SYNTHROID, LEVOTHROID) 100 MCG tablet Take 100 mcg by mouth daily.    [provider]  lovastatin (MEVACOR) 10 MG tablet Take 10 mg by mouth at bedtime.    [provider]  metFORMIN (GLUCOPHAGE) 1000 MG tablet Take 1,000 mg by mouth 2 (two) times daily.    [provider]    Allergies Patient has no known allergies.  No family history on file.  Social History Social History   Tobacco Use  . Smoking status: Former Research scientist (life sciences)  . Smokeless tobacco: Never Used  Substance Use Topics  . Alcohol  use: No  . Drug use: No    Review of Systems Unable to obtain secondary to non verbal status.  ____________________________________________   PHYSICAL EXAM:  VITAL SIGNS: ED Triage Vitals  Enc Vitals Group     BP 05/07/19 1630 (!) 186/82     Pulse Rate 05/07/19 1630 95     Resp 05/07/19 1630 20     Temp 05/07/19 1630 98.6 F (37 C)     Temp Source 05/07/19 1630 Oral     SpO2 05/07/19 1630 99 %     Weight 05/07/19 1632 172 lb (78 kg)   Constitutional: Awake and alert.  Eyes: Conjunctivae are normal.  ENT      Head: Normocephalic and atraumatic.       Nose: No congestion/rhinnorhea.      Mouth/Throat: Mucous membranes are moist.      Neck: No stridor. Hematological/Lymphatic/Immunilogical: No cervical lymphadenopathy. Cardiovascular: Normal rate, regular rhythm.  No murmurs, rubs, or gallops.  Respiratory: Normal respiratory effort without tachypnea nor retractions. Breath sounds are clear and equal bilaterally. No wheezes/rales/rhonchi. Gastrointestinal: Soft and non tender. No rebound. No guarding.  Genitourinary: Deferred Musculoskeletal: No deformity Neurologic:  Awake and alert. Non verbal.  Right facial droop.  Sensation intact.  Moving all extremities. Skin:  Skin is warm, dry and intact. No rash noted. ____________________________________________    LABS (pertinent positives/negatives)  CMP wnl except co2 21, glu 110, ast 42, alt 60 INR 0.9 CBC wbc 6.7, hgb 13.1, plt 245  ____________________________________________   EKG  I, Nance Pear, attending physician, personally viewed and interpreted this EKG  EKG Time: 1707 Rate: 92 Rhythm: sinus rhythm Axis: normal Intervals: qtc 475 QRS: nonspecific IVCD ST changes: no st elevation Impression: abnormal ekg   ____________________________________________    RADIOLOGY  CT head Left temporal encephalomalacia. No acute findings  I, Nance Pear, personally discussed these images and results by phone with the on-call radiologist and used this discussion as part of my medical decision making.   ____________________________________________   PROCEDURES  Procedures  CRITICAL CARE Performed by: Nance Pear   Total critical care time: 35 minutes  Critical care time was exclusive of separately billable procedures and treating other patients.  Critical care was necessary to treat or prevent imminent or life-threatening deterioration.  Critical care was time spent personally by me on the following activities: development of treatment plan with  patient and/or surrogate as well as nursing, discussions with consultants, evaluation of patient's response to treatment, examination of patient, obtaining history from patient or surrogate, ordering and performing treatments and interventions, ordering and review of laboratory studies, ordering and review of radiographic studies, pulse oximetry and re-evaluation of patient's condition.  ____________________________________________   INITIAL IMPRESSION / ASSESSMENT AND PLAN / ED COURSE  Pertinent labs & imaging results that were available during my care of the patient were reviewed by me and considered in my medical decision making (see chart for details).   Patient presented to the emergency department today because of difficulty with speech.  She was noted to be nonverbal and have some right sided facial droop upon exam.  Husband did have concern that she might of gotten hit in head by a job exam did not show any head trauma and at this point I think likely patient dropped the jaw.  Given concern for acute onset aphasia code stroke was called.  During that the tele-neurologist evaluation apparently the patient started having seizure-like activity.  Given her history of encephalitis tele-neurologist thought  more likely this was seizure episode than true stroke.  Did not recommend TPA.  Did recommend Ativan and Keppra.  Patient was given these medications and did start to improve.  She was observed in the emergency department for a number of hours with improvement in was able to say 1 or 2 words although still cannot string together sentence.  Discussed with Dr. Rory Percy with neurology at [facility].  Will plan on transferring over to [facility] for further monitoring and evaluation.  Discussed plan with patient and family.   ____________________________________________   FINAL CLINICAL IMPRESSION(S) / ED DIAGNOSES  Final diagnoses:  Seizure-like activity ([place])  Difficulty with speech      Note: This dictation was prepared with Dragon dictation. Any transcriptional errors that result from this process are unintentional     Nance Pear, MD 05/07/19 2232

## 2019-05-07 NOTE — Consult Note (Signed)
[facility] Tele[facility]urology Consult Services   Date of Service:   05/07/2019 16:40:39  Impression:     .  Complex partial status epilepticus  Comments/Sign-Out: While I was watching her she would not speak and she became a little bit less in contact with her surroundings started twitching first on the left side of her face and then both sides of her face and discontinued for well over three minutes and did not stop in the time I signed off. This is most likely complex partial status epilepticus. I advised that she be given a thousand milligrams of Keppra and 1 mg of Ativan be monitored.  Mechanism of Stroke: Not Clear  Metrics: Last Known Well: 05/07/2019 15:30:00 [facility] Notification Time: 05/07/2019 16:40:39 Arrival Time: 05/07/2019 16:20:00 Stamp Time: 05/07/2019 16:40:39 Time First Login Attempt: 05/07/2019 16:46:01 Video Start Time: 05/07/2019 16:46:01  Symptoms: Unable to speak NIHSS Start Assessment Time: 05/07/2019 16:52:00 Patient is not a candidate for tPA. Patient was not deemed candidate for tPA thrombolytics because of She appears to be in status epilepticus. In my opinion this is not a stroke and she should not be treated with tPA.. Video End Time: 05/07/2019 16:58:58  CT head showed no acute hemorrhage or acute core infarct. CT head was reviewed and results were: There is a large area of encephalomalacia involving most of the anterior temporal lobe and medial temporal lobe including the hippocampus. It extends upward into the insula region and possibly the answer limb of the internal capsule.  Clinical Presentation is not Suggestive of Large Vessel Occlusive Disease  Radiologist was not called back for review of advanced imaging because Not indicated ED Physician notified of diagnostic impression and management plan on 05/07/2019 16:57:00  Sign Out:     .  Discussed with Emergency Department  Provider    ------------------------------------------------------------------------------  History of Present Illness: Patient is a 73 year old Female.  Patient was brought by EMS for symptoms of Unable to speak  This is a 73 year old woman who was at her baseline until approximate 1530 when she became unable to speak and she appeared confused. Apparently a container with rice and it fell on her head and she had no overt signs of trauma. She was described as having a right facial droop and right-sided weakness when she came in. 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 She has had continuing difficulties with finding words and naming objects. She has had seizures before. She had been on Keppra but was taken off of it. She has diabetes and hyperlipidemia.  Last seen normal was within 4.5 hours. There is no history of Recent Anticoagulants. There is no history of recent major surgery. There is no history of recent stroke.  Examination: BP(182/86), Pulse(92), Blood Glucose(104) 1A: Level of Consciousness - Alert; keenly responsive + 0 1B: Ask Month and Age - Aphasic + 2 1C: Blink Eyes & Squeeze Hands - Performs 0 Tasks + 2 2: Test Horizontal Extraocular Movements - Normal + 0 3: Test Visual Fields - No Visual Loss + 0 4: Test Facial Palsy (Use Grimace if Obtunded) - Normal symmetry + 0 5A: Test Left Arm Motor Drift - No Drift for 10 Seconds + 0 5B: Test Right Arm Motor Drift - No Drift for 10 Seconds + 0 6A: Test Left Leg Motor Drift - No Drift for 5 Seconds + 0 6B: Test Right Leg Motor Drift - No Drift for 5 Seconds + 0 7: Test  Limb Ataxia (FNF/Heel-Shin) - No Ataxia + 0 8: Test Sensation - Normal; No sensory loss + 0 9: Test Language/Aphasia - Mute/Global Aphasia: No Usable Speech/Auditory Comprehension + 3 10: Test Dysarthria - Mild-Moderate Dysarthria: Slurring but can be understood + 1 11: Test Extinction/Inattention - No abnormality +  0  NIHSS Score: 8  Patient/Family was informed the Neurology Consult would happen via TeleHealth consult by way of interactive audio and video telecommunications and consented to receiving care in this manner.  Due to the immediate potential for life-threatening deterioration due to underlying acute neurologic illness, I spent 35 minutes providing critical care. This time includes time for face to face visit via telemedicine, review of medical records, imaging studies and discussion of findings with providers, the patient and/or family.   Dr Cindie Laroche   [facility] [phone]  Case [id number]

## 2019-05-08 ENCOUNTER — Observation Stay (HOSPITAL_COMMUNITY): Payer: Medicare Other

## 2019-05-08 ENCOUNTER — Inpatient Hospital Stay (HOSPITAL_COMMUNITY)
Admission: AD | Admit: 2019-05-08 | Discharge: 2019-05-12 | DRG: 100 | Disposition: A | Discharge status: Rehabilitation Facility | Payer: Medicare Other | Source: Other Acute Inpatient Hospital | Attending: Family Medicine | Admitting: Family Medicine

## 2019-05-08 DIAGNOSIS — E119 Type 2 diabetes mellitus without complications: Secondary | ICD-10-CM | POA: Diagnosis not present

## 2019-05-08 DIAGNOSIS — E669 Obesity, unspecified: Secondary | ICD-10-CM | POA: Diagnosis not present

## 2019-05-08 DIAGNOSIS — Z7982 Long term (current) use of aspirin: Secondary | ICD-10-CM

## 2019-05-08 DIAGNOSIS — Z8673 Personal history of transient ischemic attack (TIA), and cerebral infarction without residual deficits: Secondary | ICD-10-CM

## 2019-05-08 DIAGNOSIS — I10 Essential (primary) hypertension: Secondary | ICD-10-CM | POA: Diagnosis present

## 2019-05-08 DIAGNOSIS — R29810 Facial weakness: Secondary | ICD-10-CM | POA: Diagnosis present

## 2019-05-08 DIAGNOSIS — Z7984 Long term (current) use of oral hypoglycemic drugs: Secondary | ICD-10-CM | POA: Diagnosis not present

## 2019-05-08 DIAGNOSIS — E785 Hyperlipidemia, unspecified: Secondary | ICD-10-CM | POA: Diagnosis not present

## 2019-05-08 DIAGNOSIS — Z1159 Encounter for screening for other viral diseases: Secondary | ICD-10-CM

## 2019-05-08 DIAGNOSIS — B004 Herpesviral encephalitis: Secondary | ICD-10-CM | POA: Diagnosis not present

## 2019-05-08 DIAGNOSIS — Z87891 Personal history of nicotine dependence: Secondary | ICD-10-CM

## 2019-05-08 DIAGNOSIS — R410 Disorientation, unspecified: Secondary | ICD-10-CM

## 2019-05-08 DIAGNOSIS — G40909 Epilepsy, unspecified, not intractable, without status epilepticus: Secondary | ICD-10-CM | POA: Diagnosis present

## 2019-05-08 DIAGNOSIS — Z887 Allergy status to serum and vaccine status: Secondary | ICD-10-CM

## 2019-05-08 DIAGNOSIS — R4701 Aphasia: Secondary | ICD-10-CM | POA: Diagnosis not present

## 2019-05-08 DIAGNOSIS — Z79890 Hormone replacement therapy (postmenopausal): Secondary | ICD-10-CM | POA: Diagnosis not present

## 2019-05-08 DIAGNOSIS — G0490 Encephalitis and encephalomyelitis, unspecified: Secondary | ICD-10-CM | POA: Diagnosis present

## 2019-05-08 DIAGNOSIS — E1169 Type 2 diabetes mellitus with other specified complication: Secondary | ICD-10-CM | POA: Diagnosis present

## 2019-05-08 DIAGNOSIS — G8191 Hemiplegia, unspecified affecting right dominant side: Secondary | ICD-10-CM | POA: Diagnosis not present

## 2019-05-08 DIAGNOSIS — Z79899 Other long term (current) drug therapy: Secondary | ICD-10-CM

## 2019-05-08 DIAGNOSIS — R569 Unspecified convulsions: Secondary | ICD-10-CM | POA: Diagnosis not present

## 2019-05-08 DIAGNOSIS — Z6830 Body mass index (BMI) 30.0-30.9, adult: Secondary | ICD-10-CM

## 2019-05-08 DIAGNOSIS — R253 Fasciculation: Secondary | ICD-10-CM | POA: Diagnosis present

## 2019-05-08 DIAGNOSIS — E039 Hypothyroidism, unspecified: Secondary | ICD-10-CM | POA: Diagnosis present

## 2019-05-08 HISTORY — DX: Herpesviral encephalitis: B00.4

## 2019-05-08 LAB — GLUCOSE, CAPILLARY
Glucose-Capillary: 103 mg/dL — ABNORMAL HIGH (ref 70–99)
Glucose-Capillary: 110 mg/dL — ABNORMAL HIGH (ref 70–99)
Glucose-Capillary: 138 mg/dL — ABNORMAL HIGH (ref 70–99)

## 2019-05-08 LAB — TSH: TSH: 1.276 u[IU]/mL (ref 0.350–4.500)

## 2019-05-08 LAB — SARS CORONAVIRUS 2 BY RT PCR (HOSPITAL ORDER, PERFORMED IN ~~LOC~~ HOSPITAL LAB): SARS Coronavirus 2: NEGATIVE

## 2019-05-08 LAB — T4, FREE: Free T4: 1.01 ng/dL (ref 0.61–1.12)

## 2019-05-08 MED ORDER — LEVOTHYROXINE SODIUM 100 MCG PO TABS
100.0000 ug | ORAL_TABLET | Freq: Every day | ORAL | Status: DC
  Administered 2019-05-09 – 2019-05-12 (×4): 100 ug via ORAL
  Filled 2019-05-08 (×4): qty 1

## 2019-05-08 MED ORDER — SODIUM CHLORIDE 0.9 % IV SOLN
75.0000 mL/h | INTRAVENOUS | Status: DC
  Administered 2019-05-08: 75 mL/h via INTRAVENOUS

## 2019-05-08 MED ORDER — LEVETIRACETAM IN NACL 500 MG/100ML IV SOLN
500.0000 mg | Freq: Once | INTRAVENOUS | Status: AC
  Administered 2019-05-08: 500 mg via INTRAVENOUS
  Filled 2019-05-08: qty 100

## 2019-05-08 MED ORDER — ACETAMINOPHEN 160 MG/5ML PO SOLN: 650.0000 mg | ORAL | Status: DC | PRN

## 2019-05-08 MED ORDER — ENOXAPARIN SODIUM 40 MG/0.4ML ~~LOC~~ SOLN
40.0000 mg | SUBCUTANEOUS | Status: DC
  Administered 2019-05-08 – 2019-05-12 (×5): 40 mg via SUBCUTANEOUS
  Filled 2019-05-08 (×5): qty 0.4

## 2019-05-08 MED ORDER — ASPIRIN EC 81 MG PO TBEC
81.0000 mg | DELAYED_RELEASE_TABLET | Freq: Every day | ORAL | Status: DC
  Administered 2019-05-09 – 2019-05-12 (×4): 81 mg via ORAL
  Filled 2019-05-08 (×4): qty 1

## 2019-05-08 MED ORDER — PRAVASTATIN SODIUM 10 MG PO TABS
10.0000 mg | ORAL_TABLET | Freq: Every day | ORAL | Status: DC
  Administered 2019-05-08 – 2019-05-11 (×4): 10 mg via ORAL
  Filled 2019-05-08 (×4): qty 1

## 2019-05-08 MED ORDER — STROKE: EARLY STAGES OF RECOVERY BOOK
Freq: Once | Status: AC
  Administered 2019-05-08: 14:00:00

## 2019-05-08 MED ORDER — ACETAMINOPHEN 325 MG PO TABS: 650.0000 mg | ORAL_TABLET | ORAL | Status: DC | PRN

## 2019-05-08 MED ORDER — LEVETIRACETAM IN NACL 1000 MG/100ML IV SOLN
1000.0000 mg | Freq: Once | INTRAVENOUS | Status: AC
  Administered 2019-05-08: 1000 mg via INTRAVENOUS
  Filled 2019-05-08 (×2): qty 100

## 2019-05-08 MED ORDER — LORAZEPAM 2 MG/ML IJ SOLN: 1.0000 mg | INTRAMUSCULAR | Status: DC | PRN

## 2019-05-08 MED ORDER — INSULIN ASPART 100 UNIT/ML ~~LOC~~ SOLN
0.0000 [IU] | Freq: Three times a day (TID) | SUBCUTANEOUS | Status: DC
  Administered 2019-05-09: 13:00:00 3 [IU] via SUBCUTANEOUS
  Administered 2019-05-10: 2 [IU] via SUBCUTANEOUS
  Administered 2019-05-10: 3 [IU] via SUBCUTANEOUS
  Administered 2019-05-10: 2 [IU] via SUBCUTANEOUS
  Administered 2019-05-11 (×3): 1 [IU] via SUBCUTANEOUS
  Administered 2019-05-12 (×2): 2 [IU] via SUBCUTANEOUS

## 2019-05-08 MED ORDER — INSULIN ASPART 100 UNIT/ML ~~LOC~~ SOLN: 0.0000 [IU] | Freq: Every day | SUBCUTANEOUS | Status: DC

## 2019-05-08 MED ORDER — SENNOSIDES-DOCUSATE SODIUM 8.6-50 MG PO TABS: 1.0000 | ORAL_TABLET | Freq: Every evening | ORAL | Status: DC | PRN

## 2019-05-08 MED ORDER — LEVETIRACETAM IN NACL 1000 MG/100ML IV SOLN
1000.0000 mg | Freq: Two times a day (BID) | INTRAVENOUS | Status: DC
  Administered 2019-05-08 – 2019-05-11 (×6): 1000 mg via INTRAVENOUS
  Filled 2019-05-08 (×7): qty 100

## 2019-05-08 MED ORDER — ACETAMINOPHEN 650 MG RE SUPP: 650.0000 mg | RECTAL | Status: DC | PRN

## 2019-05-08 NOTE — Progress Notes (Signed)
PT Cancellation Note  Patient Details Name: Cathy Harrington MRN: [id number] DOB: 05/21/1946   Cancelled Treatment:    Reason Eval/Treat Not Completed: Patient at procedure or test/unavailable   Currently receiving EEG;   Will reattempt;   Roney Marion, PT  Acute Rehabilitation Services Pager [phone] Office [phone]    [place] 05/08/2019, 2:21 PM

## 2019-05-08 NOTE — ED Notes (Signed)
Resting quietly with eyes closed, no acute distress noted. °

## 2019-05-08 NOTE — Progress Notes (Signed)
EEG Completed; Results Pending  

## 2019-05-08 NOTE — Consult Note (Signed)
Neurology Consultation  Reason for Consult: Aphasia  Referring Physician: Dr. Lorin Mercy  History is obtained from: Chart and Son  HPI: Cathy Harrington is a 73 y.o. female with history of with history of DM type 2, hypothyroidism, and suspected viral encephalitis in 9211 complicated by seizures (discharged on Keppra, weaned off) who presents today as a transfer from [facility] following a possible traumatic injury to her head (jar fell on head) and new onset aphasia. At arrival, patient was reported to be confused, aphasic, R facial droop and R sided weakness. 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 After that episode, she was completely mute for about a month but gradually recovered her speech over several months with residual dysnomia per son.   She was on Keppra 500 mg BID for seizures, but was taken off this about 1.5 years ago without seizure recurrence until the episode precipitating the current admission.   On assessment today patient was awake, alert, affect is appropriate, pleasant, speech is dysarthric and nonsensical, not following commands secondary to underlying mixed receptive and expressive aphasia. She appears confused and having RUE alternating finger movements, unsure if they are voluntary or involuntary, patient unable to answer.  On attending exam the patient was alert, with good eye contact, attempted to follow pantomimed commands but had dense expressive and receptive aphasia.   ROS: Unable to obtain due to altered mental status.   Past Medical History:  Diagnosis Date  . Diabetes mellitus without complication ([place])   . Hypothyroidism   . Stroke ([place])    2017 1 week [facility] and 2 weeks [facility] 6 months rehab  .  Thyroid disease    No family history on file.  Social History:   reports that she has quit smoking. She has never used smokeless tobacco. She reports that she does not drink alcohol or use drugs.  Medications  Current Facility-Administered Medications:  .  0.9 %  sodium chloride infusion, 75 mL/hr, Intravenous, Continuous, Karmen Bongo, MD, Last Rate: 75 mL/hr at 05/08/19 1353, 75 mL/hr at 05/08/19 1353 .  acetaminophen (TYLENOL) tablet 650 mg, 650 mg, Oral, Q4H PRN **OR** acetaminophen (TYLENOL) solution 650 mg, 650 mg, Per Tube, Q4H PRN **OR** acetaminophen (TYLENOL) suppository 650 mg, 650 mg, Rectal, Q4H PRN, Karmen Bongo, MD .  Derrill Memo ON 05/09/2019] aspirin EC tablet 81 mg, 81 mg, Oral, Daily, Karmen Bongo, MD .  enoxaparin (LOVENOX) injection 40 mg, 40 mg, Subcutaneous, Q24H, Karmen Bongo, MD .  insulin aspart (novoLOG) injection 0-15 Units, 0-15 Units, Subcutaneous, TID WC, Karmen Bongo, MD .  insulin aspart (novoLOG) injection 0-5 Units, 0-5 Units, Subcutaneous, QHS, Karmen Bongo, MD .  levETIRAcetam (KEPPRA) IVPB 1000 mg/100 mL premix, 1,000 mg, Intravenous, Q12H, Kerney Elbe, MD, Last Rate: 400 mL/hr at 05/08/19 1412, 1,000 mg at 05/08/19 1412 .  levETIRAcetam (KEPPRA) IVPB 1000 mg/100 mL premix, 1,000 mg, Intravenous, Once, Kerney Elbe, MD .  Derrill Memo ON 05/09/2019] levothyroxine (SYNTHROID) tablet 100 mcg, 100 mcg, Oral, Q0600, Karmen Bongo, MD .  LORazepam (ATIVAN) injection 1-2 mg, 1-2 mg, Intravenous, Q2H PRN, Karmen Bongo, MD .  pravastatin (PRAVACHOL) tablet 10 mg, 10 mg, Oral, q1800, Karmen Bongo, MD .  senna-docusate (Senokot-S) tablet 1 tablet, 1 tablet, Oral, QHS PRN, Karmen Bongo,  MD   Exam: Current vital signs: BP (!) 150/73 (BP Location: Right Arm)   Pulse 75   Temp 97.6 F (36.4 C) (Axillary)   Resp 20   SpO2 98%  Vital signs in last 24 hours: Temp:  [97.6 F (36.4 C)-98.6 F (37 C)] 97.6 F (36.4 C) (07/05 1049) Pulse Rate:   [62-94] 75 (07/05 1049) Resp:  [14-29] 20 (07/05 1049) BP: (136-188)/(73-101) 150/73 (07/05 1049) SpO2:  [92 %-100 %] 98 % (07/05 1049) Weight:  [78 kg] 78 kg (07/04 1632)  Physical Exam  Constitutional: Appears well-developed and well-nourished.  Psych: Affect appropriate to situation Eyes: No scleral injection HENT: No OP obstrucion Head: Normocephalic.  Cardiovascular: Normal rate and regular rhythm.  Respiratory: Effort normal, non-labored breathing GI: Soft.  No distension. There is no tenderness.  Skin: WDI  Neuro: Mental Status: Patient is awake, alert, unable to assess orientation due to aphasia.  Patient is unable to give a clear and coherent history due to underlying aphasia. Cranial Nerves: II: Visual Fields are full.  III,IV, VI: EOMI without ptosis or diploplia. Pupils equal, round and reactive to light V: UTA VII: Facial movement is symmetric.  VIII: hearing is intact to voice X: Palat elevates symmetrically XI: Shoulder shrug is symmetric. XII: tongue is midline without atrophy or fasciculations.  Motor: Tone is normal. Bulk is normal. 5/5 strength was present in all four extremities. Sensory: UTA. Roughly symmetric to light touch.  Deep Tendon Reflexes: 2+ and symmetric in the biceps and patellae.  Plantars: Toes are downgoing bilaterally.  Cerebellar: UTA  Labs I have reviewed labs in epic and the results pertinent to this consultation are:  CBC    Component Value Date/Time   WBC 6.7 05/07/2019 1642   RBC 4.66 05/07/2019 1642   HGB 13.1 05/07/2019 1642   HGB 13.4 11/15/2014 2302   HCT 40.7 05/07/2019 1642   HCT 41.3 11/15/2014 2302   PLT 245 05/07/2019 1642   PLT 244 11/15/2014 2302   MCV 87.3 05/07/2019 1642   MCV 86 11/15/2014 2302   MCH 28.1 05/07/2019 1642   MCHC 32.2 05/07/2019 1642   RDW 14.6 05/07/2019 1642   RDW 15.4 (H) 11/15/2014 2302   LYMPHSABS 2.1 05/07/2019 1642   MONOABS 0.8 05/07/2019 1642   EOSABS 0.1 05/07/2019 1642    BASOSABS 0.0 05/07/2019 1642    CMP     Component Value Date/Time   NA 142 05/07/2019 1642   NA 142 11/15/2014 2302   K 4.1 05/07/2019 1642   K 4.0 11/15/2014 2302   CL 111 05/07/2019 1642   CL 107 11/15/2014 2302   CO2 21 (L) 05/07/2019 1642   CO2 27 11/15/2014 2302   GLUCOSE 110 (H) 05/07/2019 1642   GLUCOSE 157 (H) 11/15/2014 2302   BUN 15 05/07/2019 1642   BUN 19 (H) 11/15/2014 2302   CREATININE 0.76 05/07/2019 1642   CREATININE 0.83 11/15/2014 2302   CALCIUM 9.5 05/07/2019 1642   CALCIUM 9.3 11/15/2014 2302   PROT 7.8 05/07/2019 1642   ALBUMIN 4.7 05/07/2019 1642   AST 42 (H) 05/07/2019 1642   ALT 60 (H) 05/07/2019 1642   ALKPHOS 61 05/07/2019 1642   BILITOT 0.5 05/07/2019 1642   GFRNONAA >60 05/07/2019 1642   GFRNONAA >60 11/15/2014 2302   GFRAA >60 05/07/2019 1642   GFRAA >60 11/15/2014 2302   UDS, T4, TSH, lipid panel, A1c pending.  Imaging I have reviewed the images obtained:  CT brain: 1. Extensive left temporal  lobe, insula and also left cingulate gyrus encephalomalacia appears related to the 2017 insult. 2. No acute cortically based infarct or acute intracranial hemorrhage identified. ASPECTS is 10.  EEG: Patient's initial EEG tracings show semi-rhythmic slow spike and wave activity in the left hemisphere that evolves and then dissipates over approximately 30 seconds  Posey Pronto PA-C [facility] [phone]  Assessment: 72 yo F PMH hypothyroidism, DM type 2, viral encephalitis complicated by seizures, presenting with acute onset aphasia and confusion possibly secondary to subclinical seizure. EEG reveals electrographic seizure.   - At OSH, noted to have R facial droop, R facial twitching, and RHB weakness, since resolved. 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 Discharged on Keppra 500 mg BID and weaned off in 2018 without  further seizures.  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  - LTM initiated - MRI brain - Loaded with 1g Keppra at OSH, her Keppra dose scheduled for this afternoon infiltrated and she did not receive a significant amount IV. Will reorder a bolus of 1000 mg IV Keppra now, followed by Keppra 1g BID as maintenance dose. - PT/OT/ST consults - Per [facility] statutes, patients with seizures are not allowed to drive until  they have been seizure-free for six months. Use caution when using heavy equipment or power tools. Avoid working on ladders or at heights. Take showers instead of baths. Ensure the water temperature is not too high on the home water heater. Do not go swimming alone. When caring for infants or small children, sit down when holding, feeding, or changing them to minimize risk of injury to the child in the event you have a seizure. Also, Maintain good sleep hygiene. Avoid alcohol.  I have seen and examined the patient. I have formulated the assessment and plan. 73 year old female with prior HSV encephalitis and seizures. Presents with breakthrough seizure after being off anticonvulsant for 1.5 years. EEG shows seizure activity. Started on Keppra 1000 mg BID. LTM EEG overnight.  Electronically signed: Dr. Kerney Elbe

## 2019-05-08 NOTE — Procedures (Signed)
ELECTROENCEPHALOGRAM REPORT   Patient: Cathy Harrington       Room #: 0S81J EEG No. ID: [id number] Age: 73 y.o.        Sex: female Referring Physician: Lorin Mercy Report Date:  05/08/2019        Interpreting Physician: Alexis Goodell  History: QUANTAVIA FRITH is an 73 y.o. female with aphasia and right facial twitching  Medications:  ASA, Insulin, Keppra, Synthroid, Pravachol  Conditions of Recording:  This is a 21 channel routine scalp EEG performed with bipolar and monopolar montages arranged in accordance to the international 10/20 system of electrode placement. One channel was dedicated to EKG recording.  The patient is in the awake state.  Description:  The waking background activity consists of a low voltage, fairly well organized, 10 Hz alpha activity, seen from the parieto-occipital and posterior temporal regions.  Low voltage fast activity, poorly organized, is seen anteriorly and is at times superimposed on more posterior regions.  A mixture of theta and alpha rhythms are seen from the central and temporal regions.  Also noted are frequent, intermittent left temporal spike and slow wave activity with phase reversal at T3.  At one point during the recording these sharp waves take on the appearance of a PLEDs rhythm with the intervening background activity becoming attenuated.   The patient does not drowse or sleep. Hyperventilation and intermittent photic stimulation were not performed.  IMPRESSION: This is an abnormal electroencephalogram secondary to left midtemporal spike and slow wave activity.  At one point during the recording this left focal activity becomes more PLED-like in character which may possibly suggest some subclinical seizure activity.   Clinical correlation recommended.   Alexis Goodell, MD Neurology [phone] 05/08/2019, 2:52 PM

## 2019-05-08 NOTE — ED Notes (Signed)
emtala reviewed by this RN 

## 2019-05-08 NOTE — H&P (Signed)
History and Physical    CJ BEECHER WUJ:[id number] DOB: Jul 30, 1946 DOA: 05/08/2019  PCP:  Danae Orleans Consultants:  Melrose Nakayama - neurology Patient coming from: Home - lives with husband; NOK: Husband, [phone]; Son (psychiatrist), [phone]  Chief Complaint: Aphasia  HPI: DELISSA SILBA is a 73 y.o. female with medical history significant of T2DM, hypothyroidism, and suspected viral encephalitis in 2017 though CSF was not tested for HSV then, seizure during hospitalization for encephalitis in 2017, was discharged on [place] then, never had another szr, and has not been on any antiepileptic. She had SLP services after that hospitalization for cognitive/linguistic therapy and was discharged on 11/23/18.   She presented to [facility] after a possible traumatic injury (a jar may have fallen and hit her head), and she developed aphasia.  She had some R facial droop and drooling and then developed twitching of her R face.  Teleneurology was consulted and suggested this could be partial complex status epilepticus; she was given Keppra and Ativan and transferred to [facility] for further neurologic evaluation since she did not return to baseline.  She was pleasant and alert at the time of my evaluation but unable to answer questions.  She intermittently follows commands.  I spoke with her son.  This is how she presented 3 years ago.  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  Her temporal lobes are "basically obliterated."  She was aphasic for weeks to a month and slow regained her speech over time.   She was on Keppra for about a year and she was taken off the [place] about 1 year after prior hospitalization without further seizure activity.  Her son spoke with her about an hour before she went to the hospital yesterday and was normal.  She regained about 90% of her function following last hospitalization - the main deficit is her inability to recall names (people, food, places).  Last time this happened, she  reverted to her native language (arabic, [place]); her English is quite good.    ED Course:  [facility] transfer, per Dr. Myna Hidalgo:  She was noted by husband to be acutely aphasic today with right facial droop and drooling. In the ED, she was having twitching of her right face. Vitals with mild HTN. Labs with slight transaminase elevations, CBC w/ diff normal. Head CT with extensive encephalomalacia on left from 2017, no acute findings.   She was treated with 1 g Keppra and 1 mg Ativan. She continued to be aphasic, case was discussed with Dr. Rory Percy of neuro at [facility] who recommended another 1 mg Ativan and continued ED observation, and admission to [facility] if she does not return to baseline.   She has improved, now speaking 2 words at a time, but unable to form sentence. No fever, cough, SOB, or sick-contacts; COVID is pending but she will need [facility] for neuro either way and will need to be repeated here.    Review of Systems: Unable to perform   Past Medical History:  Diagnosis Date  . Diabetes mellitus without complication ([place])   . Hypothyroidism   . Stroke ([place])    2017 1 week [facility] and 2 weeks [facility] 6 months rehab  . Thyroid disease     Past Surgical History:  Procedure Laterality Date  . BACK SURGERY    . BREAST SURGERY    . COLONOSCOPY    . COLONOSCOPY WITH PROPOFOL N/A 01/19/2018   Procedure: COLONOSCOPY WITH PROPOFOL;  Surgeon: Lollie Sails, MD;  Location:  [facility];  Service: Endoscopy;  Laterality: N/A;  . parathyroidectomy      Social History   Socioeconomic History  . Marital status: Married    Spouse name: Not on file  . Number of children: Not on file  . Years of education: Not on file  . Highest education level: Not on file  Occupational History  . Not on file  Social Needs  . Financial resource strain: Not on file  . Food insecurity    Worry: Not on file    Inability: Not on file  . Transportation needs    Medical: Not on file    Non-medical: Not on file   Tobacco Use  . Smoking status: Former Research scientist (life sciences)  . Smokeless tobacco: Never Used  Substance and Sexual Activity  . Alcohol use: No  . Drug use: No  . Sexual activity: Not on file  Lifestyle  . Physical activity    Days per week: Not on file    Minutes per session: Not on file  . Stress: Not on file  Relationships  . Social Herbalist on phone: Not on file    Gets together: Not on file    Attends religious service: Not on file    Active member of club or organization: Not on file    Attends meetings of clubs or organizations: Not on file    Relationship status: Not on file  . Intimate partner violence    Fear of current or ex partner: Not on file    Emotionally abused: Not on file    Physically abused: Not on file    Forced sexual activity: Not on file  Other Topics Concern  . Not on file  Social History Narrative  . Not on file    Allergies  Allergen Reactions  . Tetanus Toxoid Rash    No family history on file.  Prior to Admission medications   Medication Sig Start Date End Date Taking? Authorizing Provider  aspirin EC 81 MG tablet Take 81 mg by mouth daily.    [provider]  glipiZIDE (GLUCOTROL) 5 MG tablet Take 5 mg by mouth daily before breakfast.     [provider]  levothyroxine (SYNTHROID, LEVOTHROID) 100 MCG tablet Take 100 mcg by mouth daily.    [provider]  lovastatin (MEVACOR) 10 MG tablet Take 10 mg by mouth at bedtime.    [provider]  metFORMIN (GLUCOPHAGE) 1000 MG tablet Take 1,000 mg by mouth 2 (two) times daily.    [provider]    Physical Exam: Vitals:   05/08/19 1049  BP: (!) 150/73  Pulse: 75  Resp: 20  Temp: 97.6 F (36.4 C)  TempSrc: Axillary  SpO2: 98%     . General:  Appears awake, alert, pleasant, but only intermittently follows commands . Eyes:  EOMI, normal lids, iris . ENT:  grossly normal hearing, lips & tongue, mmm . Neck:  no LAD, masses or thyromegaly .  Cardiovascular:  RRR, no m/r/g. No LE edema.  Marland Kitchen Respiratory:   CTA bilaterally with no wheezes/rales/rhonchi.  Normal respiratory effort. . Abdomen:  soft, NT, ND, NABS . Skin:  no rash or induration seen on limited exam . Musculoskeletal:  grossly normal tone BUE/BLE, good ROM, no bony abnormality . Psychiatric:  grossly normal affect, one-word responses intermittently . Neurologic:  CN 2-12 grossly intact, moves all extremities in coordinated fashion, follows commands intermittently and so subtle deficits would easily be missed  Radiological Exams on Admission: Ct Head Code Stroke Wo Contrast  Addendum Date: 05/07/2019   ADDENDUM REPORT: 05/07/2019 17:14 ADDENDUM: Study discussed by telephone with Dr. Nance Pear on 05/07/2019 at 1703 hours. Electronically Signed   By: Genevie Ann M.D.   On: 05/07/2019 17:14   Result Date: 05/07/2019 CLINICAL DATA:  Code stroke. 73 year old female reportedly status post blunt trauma, but loss of speech and right side weakness. EXAM: CT HEAD WITHOUT CONTRAST TECHNIQUE: Contiguous axial images were obtained from the base of the skull through the vertex without intravenous contrast. COMPARISON:  Brain MRI 06/18/2016 and earlier. 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 There is also some associated encephalomalacia of the anterior left cingulate gyrus. Associated mild ex vacuo enlargement now of the left lateral ventricle. Associated white matter hypodensity about the left frontal horn. Elsewhere gray-white matter differentiation is preserved. No midline shift, ventriculomegaly, mass effect, evidence of mass lesion, intracranial hemorrhage or evidence of cortically based acute infarction. Vascular: Calcified atherosclerosis at the skull base. No suspicious intracranial vascular hyperdensity. Skull: Stable and negative. Sinuses/Orbits: Visualized paranasal sinuses  and mastoids are stable and well pneumatized. Other: Visualized orbits and scalp soft tissues are within normal limits. ASPECTS [facility] Stroke Program Early CT Score) - Ganglionic level infarction (caudate, lentiform nuclei, internal capsule, insula, M1-M3 cortex): 7 - Supraganglionic infarction (M4-M6 cortex): 3 Total score (0-10 with 10 being normal): 10 (extensive chronic left temporal lobe and insula encephalomalacia). IMPRESSION: 1. Extensive left temporal lobe, insula and also left cingulate gyrus encephalomalacia appears related to the 2017 insult. 2. No acute cortically based infarct or acute intracranial hemorrhage identified. ASPECTS is 10. Electronically Signed: By: Genevie Ann M.D. On: 05/07/2019 16:58    EKG: Independently reviewed.  NSR with rate 92; nonspecific ST changes with no evidence of acute ischemia   Labs on Admission: I have personally reviewed the available labs and imaging studies at the time of the admission.  Pertinent labs:   CO2 21 Glucose 110 AST 42/ALT 60 Normal CBC INR 0.9 COVID negative here and at [facility] yesterday  Assessment/Plan Principal Problem:   Aphasia Active Problems:   Seizure ([place])   Encephalitis   Diabetes mellitus type 2 in obese ([place])   Hypothyroidism (acquired)   Aphasia -Patient with prior h/o encephalitis (?viral) with seizures, previously on Keppra 500mg  BID which was stopped 1-2 years ago without further seizure activity until yesterday -History of ?head trauma today - husband has lower suspicion for this now -She developed acute onset of aphasia -Teleneurology was concerned about partial complex status epilepticus -She was reported to be loaded with 1 gram Keppra and Ativan, although this is not confirmed -She was transferred from [facility] to [facility] for further neurologic evaluation and treatment -Patient placed in observation overnight for further evaluation -Neurology has been consulted to see the patient -Dr. Cheral Marker has ordered 1 gm IV  Keppra BID for now -He has also ordered a now EEG and with consider LTM -She also will need driving restriction for at least 6 months -Seizure precautions -Ativan prn -PT/OT/ST consults ordered -NPO for now since she does not appear to be capable of following commands and safely protecting her airway with a diet -Check UDS  DM -A1c was 7.1 in 6/17, will recheck -Hold Glucotrol and Glucophage -Will cover with moderate-scale SSI  Hypothyroidism -Check TSH and free T4 -Continue Synthroid at current dose for now  HLD -Continue Mevacor (  formulary substitution with Pravastatin) -Check FLP   Note: This patient has been tested and is negative for the novel coronavirus COVID-19.  DVT prophylaxis:  Lovenox  Code Status:   Full - I confirmed this with her son  Family Communication: None present; I called to discuss the patient with her husband Disposition Plan:  Home once clinically improved Consults called: Neurology  Admission status: It is my clinical opinion that referral for OBSERVATION is reasonable and necessary in this patient based on the above information provided. The aforementioned taken together are felt to place the patient at high risk for further clinical deterioration. However it is anticipated that the patient may be medically stable for discharge from the hospital within 24 to 48 hours.    Karmen Bongo MD [facility]   How to contact the [facility] Attending or Consulting provider [place] or covering provider during after hours [place], for this patient?  1. Check the care team in [facility] and look for a) attending/consulting [facility] provider listed and b) the [facility] team listed 2. Log into [email or web address] and use [place]'s universal password to access. If you do not have the password, please contact the hospital operator. 3. Locate the [facility] provider you are looking for under [facility] and page to a number that you can be directly reached. 4. If you still have  difficulty reaching the provider, please page the [facility] (Director on Call) for the Hospitalists listed on amion for assistance.   05/08/2019, 1:08 PM

## 2019-05-08 NOTE — ED Provider Notes (Signed)
Vitals:   05/08/19 0520 05/08/19 0630  BP: (!) 143/80 138/88  Pulse: 70 62  Resp: 20 (!) 22  Temp:    SpO2: 99% 93%    ----------------------------------------- 7:22 AM on 05/08/2019 -----------------------------------------  Patient was asleep.  She arouses to voice.  She is understanding and agreeable with plan to go to [facility] for additional monitoring and seizure evaluation.  Patient is stable for transfer.     Delman Kitten, MD 05/08/19 [phone]

## 2019-05-08 NOTE — ED Notes (Signed)
Pt ambulated to toilet with this RN and Jenetta Downer, Therapist, sports.

## 2019-05-08 NOTE — Progress Notes (Signed)
Patient's initial EEG tracings show semi-rhythmic slow spike and wave activity in the left hemisphere that evolves and then dissipates over approximately 30 seconds. Of note, her Keppra dose scheduled for this afternoon infiltrated and she did not receive a significant amount IV.   Will reorder a bolus of 1000 mg IV Keppra now.   Electronically signed: Dr. Kerney Elbe

## 2019-05-08 NOTE — ED Notes (Signed)
Patient resting quietly with eyes closed, no acute distress noted.

## 2019-05-08 NOTE — ED Notes (Signed)
Husband called and given update that we are still awaiting a bed assignment at [facility]  Husband verbalized understanding.

## 2019-05-08 NOTE — Progress Notes (Signed)
LTM started; educated nurse on event button. Dr Deborah Chalk notified.

## 2019-05-08 NOTE — Evaluation (Addendum)
Speech Language Pathology Evaluation Patient Details Name: Cathy Harrington MRN: [id number] DOB: February 22, 1946 Today's Date: 05/08/2019 Time: 0160-1093 SLP Time Calculation (min) (ACUTE ONLY): 23 min  Problem List:  Patient Active Problem List   Diagnosis Date Noted  . Aphasia 05/08/2019  . Diabetes mellitus type 2 in obese ([place]) 05/08/2019  . Hypothyroidism (acquired) 05/08/2019  . Altered mental status   . Pyrexia   . Seizure ([place])   . Encephalitis   . Seizures ([place]) 04/24/2016  . Acute respiratory failure ([place]) 04/24/2016   Past Medical History:  Past Medical History:  Diagnosis Date  . Diabetes mellitus without complication ([place])   . Hypothyroidism   . Stroke ([place])    2017 1 week [facility] and 2 weeks [facility] 6 months rehab  . Thyroid disease    Past Surgical History:  Past Surgical History:  Procedure Laterality Date  . BACK SURGERY    . BREAST SURGERY    . COLONOSCOPY    . COLONOSCOPY WITH PROPOFOL N/A 01/19/2018   Procedure: COLONOSCOPY WITH PROPOFOL;  Surgeon: Lollie Sails, MD;  Location: [facility] ENDOSCOPY;  Service: Endoscopy;  Laterality: N/A;  . parathyroidectomy     HPI:  This is a 73 year old woman who speaks 4 -5 languages fluently. (Completely fluent in [place], but she is [place] and her native language is Arabic) who was at her baseline until she became unable to speak and she appeared confused. Apparently a mostly empty container of [employer] fell on her head but she had no overt signs of trauma. She was described as having a right facial droop and right-sided weakness when she was admitted. 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 She had severe aphasia at that time and spent several months in rehab in [place], husband is worried about travelling again to a rehab facility. She mostly recovered but has had mild persistent difficulties with finding words and remembering names. She has had seizures before. MD observed seizure in  room with pt and she was loaded with Keppra.    Assessment / Plan / Recommendation Clinical Impression  Pt demonstrates a severe aphasia with expressive and receptive deficits. Pt is unable to follow any verbal only commands or identify objects. She recognizes intonation of Y/N questions and responds with Y but without comprehension. Expressive language is characterized by repetition of 3 words regardless of context - naming objects pictures, commenting or asking questions- Pt says an unintelligible word beginning with R, then [place] then [place].   Pt was unable to be cued to repeat, correct or count. Furthermore she struggled to recognize and use familiar objects including a comb, a spoon and a cracker. She was able to use her right hand in tasks but was somewhat neglectful of it and required visual and tactile cueing. She will need assist with basic ADLS such a feeding. Husband reports when she was sick before and had aphasia she was at times more responsive in Arabic.  Will f/u for further diagnostic testing but f/u with SLP needed at next level of care, TBD with futher interdisciplenary assessment.     SLP Assessment  SLP Recommendation/Assessment: Patient needs continued Speech Lanaguage Pathology Services SLP Visit Diagnosis: Aphasia (R47.01)    Follow Up Recommendations       Frequency and Duration min 2x/week  2 weeks      SLP Evaluation Cognition  Overall Cognitive Status: Impaired/Different from baseline Arousal/Alertness: Awake/alert Orientation Level: (unable to verbalize) Attention: Focused;Sustained  Focused Attention: Appears intact Sustained Attention: Appears intact Awareness: Impaired Awareness Impairment: Intellectual impairment;Emergent impairment Problem Solving: Impaired Problem Solving Impairment: Functional basic;Verbal basic       Comprehension  Auditory Comprehension Overall Auditory Comprehension: Impaired Yes/No Questions: Impaired Basic  Biographical Questions: 0-25% accurate Commands: Impaired One Step Basic Commands: 0-24% accurate Visual Recognition/Discrimination Discrimination: Exceptions to [facility] Common Objects: Unable to indentify Reading Comprehension Reading Status: Not tested    Expression Verbal Expression Overall Verbal Expression: Impaired Initiation: Impaired Automatic Speech: ([place]) Level of Generative/Spontaneous Verbalization: Phrase;Word Repetition: Impaired Level of Impairment: Word level Naming: Impairment Confrontation: Impaired Common Objects: Unable to indentify Verbal Errors: Not aware of errors(stereotypy vs perseveration) Written Expression Dominant Hand: Right Written Expression: Not tested   Oral / Motor  Oral Motor/Sensory Function Overall Oral Motor/Sensory Function: Other (comment)(could not follow oral motor commands, appears WNL) Motor Speech Overall Motor Speech: Appears within functional limits for tasks assessed Respiration: Within functional limits Phonation: Normal Resonance: Within functional limits Articulation: Within functional limitis Intelligibility: Unable to assess (comment)   GO                   Herbie Baltimore, MA [place] Pager [phone] Office [phone]  Lynann Beaver 05/08/2019, 2:00 PM

## 2019-05-08 NOTE — ED Notes (Signed)
Patient is awake, appears alert but remains aphasic.

## 2019-05-08 NOTE — Progress Notes (Addendum)
Patient arrived via [facility] from [facility]no assigned MD; [facility] admissions notified; awaiting MD and orders; patient placed into the bed; oriented to room and unit routine; bed low and locked and bed alarm set. Spoke with husband and son after arrival.

## 2019-05-08 NOTE — ED Notes (Signed)
Patient continues to rest quietly with eyes closed, no acute distress noted.

## 2019-05-08 NOTE — ED Notes (Signed)
Report given to Levada Dy, RN at [facility]

## 2019-05-08 NOTE — Evaluation (Signed)
Clinical/Bedside Swallow Evaluation Patient Details  Name: Cathy Harrington MRN: [id number] Date of Birth: 17-Aug-1946  Today's Date: 05/08/2019 Time: SLP Start Time (ACUTE ONLY): 1300 SLP Stop Time (ACUTE ONLY): 1323 SLP Time Calculation (min) (ACUTE ONLY): 23 min  Past Medical History:  Past Medical History:  Diagnosis Date  . Diabetes mellitus without complication ([place])   . Hypothyroidism   . Stroke ([place])    2017 1 week [facility] and 2 weeks [facility] 6 months rehab  . Thyroid disease    Past Surgical History:  Past Surgical History:  Procedure Laterality Date  . BACK SURGERY    . BREAST SURGERY    . COLONOSCOPY    . COLONOSCOPY WITH PROPOFOL N/A 01/19/2018   Procedure: COLONOSCOPY WITH PROPOFOL;  Surgeon: Lollie Sails, MD;  Location: [facility] ENDOSCOPY;  Service: Endoscopy;  Laterality: N/A;  . parathyroidectomy     HPI:  This is a 73 year old woman who was at her baseline until she became unable to speak and she appeared confused. Apparently a container with rice fell on her head and she had no overt signs of trauma. She was described as having a right facial droop and right-sided weakness when she came in. 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 She has had continuing difficulties with finding words and naming objects. She has had seizures before. MD observed seizure in room with pt and she was loaded with Keppra.    Assessment / Plan / Recommendation Clinical Impression  Pt demonstrates ability to masticate, drink and swallow without impairment. She does need assist to use utensils and recognize food and carry out motor plan for eating. Full supervision with meals. No SLP f/u needed for swallowing, will f/u for cognitive linguistic needs. see next note.  SLP Visit Diagnosis: Dysphagia, oropharyngeal phase (R13.12)    Aspiration Risk  Mild aspiration risk    Diet Recommendation Regular;Thin liquid   Liquid Administration via:  Cup;Straw Medication Administration: Whole meds with liquid Supervision: Staff to assist with self feeding;Full supervision/cueing for compensatory strategies Compensations: Slow rate;Small sips/bites Postural Changes: Seated upright at 90 degrees    Other  Recommendations     Follow up Recommendations        Frequency and Duration            Prognosis        Swallow Study   General HPI: This is a 73 year old woman who was at her baseline until she became unable to speak and she appeared confused. Apparently a container with rice fell on her head and she had no overt signs of trauma. She was described as having a right facial droop and right-sided weakness when she came in. 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 She has had continuing difficulties with finding words and naming objects. She has had seizures before. MD observed seizure in room with pt and she was loaded with Keppra.  Type of Study: Bedside Swallow Evaluation Previous Swallow Assessment: none Diet Prior to this Study: NPO Temperature Spikes Noted: No Respiratory Status: Room air History of Recent Intubation: No Behavior/Cognition: Alert;Cooperative;Requires cueing Oral Cavity Assessment: Within Functional Limits Oral Care Completed by SLP: No Oral Cavity - Dentition: Adequate natural dentition Vision: Functional for self-feeding Self-Feeding Abilities: Able to feed self;Needs assist Patient Positioning: Upright in bed Baseline Vocal Quality: Normal Volitional Cough: Cognitively unable to elicit Volitional Swallow: Unable to elicit    Oral/Motor/Sensory  Function Overall Oral Motor/Sensory Function: Other (comment)(could not follow oral motor commands, appears WNL)   Ice Chips Ice chips: Not tested   Thin Liquid Thin Liquid: Within functional limits Presentation: Cup;Straw;Self Fed    Nectar Thick Nectar Thick Liquid: Not tested   Honey Thick Honey Thick Liquid: Not  tested   Puree Puree: Within functional limits Presentation: Spoon Other Comments: needs assist   Solid     Solid: Within functional limits Presentation: (needs asssit)     Herbie Baltimore, MA [place] Pager [phone] Office [phone]  Shantavia Jha, Katherene Ponto 05/08/2019,1:28 PM

## 2019-05-08 NOTE — ED Notes (Signed)
Husband at bedside. Given room information for pt transfer. Consent for transfer signed.

## 2019-05-08 NOTE — ED Provider Notes (Signed)
-----------------------------------------   6:58 AM on 05/08/2019 -----------------------------------------   Blood pressure 138/88, pulse 62, temperature 98.6 F (37 C), temperature source Oral, resp. rate (!) 22, weight 78 kg, SpO2 93 %.  The patient is calm and cooperative at this time.  There have been no acute events since the last update.  Awaiting bed assignment at and transportation to [facility].   Hinda Kehr, MD 05/08/19 [phone]

## 2019-05-08 NOTE — ED Notes (Signed)
Report given to [facility]. ETA 20 minutes. ?

## 2019-05-09 DIAGNOSIS — S069X1S Unspecified intracranial injury with loss of consciousness of 30 minutes or less, sequela: Secondary | ICD-10-CM | POA: Diagnosis not present

## 2019-05-09 DIAGNOSIS — Z833 Family history of diabetes mellitus: Secondary | ICD-10-CM | POA: Diagnosis not present

## 2019-05-09 DIAGNOSIS — M549 Dorsalgia, unspecified: Secondary | ICD-10-CM | POA: Diagnosis present

## 2019-05-09 DIAGNOSIS — G40901 Epilepsy, unspecified, not intractable, with status epilepticus: Secondary | ICD-10-CM | POA: Diagnosis present

## 2019-05-09 DIAGNOSIS — E119 Type 2 diabetes mellitus without complications: Secondary | ICD-10-CM | POA: Diagnosis present

## 2019-05-09 DIAGNOSIS — S069X0S Unspecified intracranial injury without loss of consciousness, sequela: Secondary | ICD-10-CM | POA: Diagnosis not present

## 2019-05-09 DIAGNOSIS — G8929 Other chronic pain: Secondary | ICD-10-CM | POA: Diagnosis present

## 2019-05-09 DIAGNOSIS — R4189 Other symptoms and signs involving cognitive functions and awareness: Secondary | ICD-10-CM | POA: Diagnosis present

## 2019-05-09 DIAGNOSIS — R253 Fasciculation: Secondary | ICD-10-CM | POA: Diagnosis present

## 2019-05-09 DIAGNOSIS — R29810 Facial weakness: Secondary | ICD-10-CM | POA: Diagnosis present

## 2019-05-09 DIAGNOSIS — Z7984 Long term (current) use of oral hypoglycemic drugs: Secondary | ICD-10-CM | POA: Diagnosis not present

## 2019-05-09 DIAGNOSIS — S06890D Other specified intracranial injury without loss of consciousness, subsequent encounter: Secondary | ICD-10-CM | POA: Diagnosis present

## 2019-05-09 DIAGNOSIS — G40909 Epilepsy, unspecified, not intractable, without status epilepticus: Secondary | ICD-10-CM | POA: Diagnosis present

## 2019-05-09 DIAGNOSIS — Z1159 Encounter for screening for other viral diseases: Secondary | ICD-10-CM | POA: Diagnosis not present

## 2019-05-09 DIAGNOSIS — Z87891 Personal history of nicotine dependence: Secondary | ICD-10-CM | POA: Diagnosis not present

## 2019-05-09 DIAGNOSIS — E669 Obesity, unspecified: Secondary | ICD-10-CM | POA: Diagnosis present

## 2019-05-09 DIAGNOSIS — B004 Herpesviral encephalitis: Secondary | ICD-10-CM | POA: Diagnosis present

## 2019-05-09 DIAGNOSIS — R569 Unspecified convulsions: Secondary | ICD-10-CM | POA: Diagnosis present

## 2019-05-09 DIAGNOSIS — W208XXD Other cause of strike by thrown, projected or falling object, subsequent encounter: Secondary | ICD-10-CM | POA: Diagnosis present

## 2019-05-09 DIAGNOSIS — S069X2S Unspecified intracranial injury with loss of consciousness of 31 minutes to 59 minutes, sequela: Secondary | ICD-10-CM | POA: Diagnosis not present

## 2019-05-09 DIAGNOSIS — Z6830 Body mass index (BMI) 30.0-30.9, adult: Secondary | ICD-10-CM | POA: Diagnosis not present

## 2019-05-09 DIAGNOSIS — G8191 Hemiplegia, unspecified affecting right dominant side: Secondary | ICD-10-CM | POA: Diagnosis present

## 2019-05-09 DIAGNOSIS — R269 Unspecified abnormalities of gait and mobility: Secondary | ICD-10-CM | POA: Diagnosis present

## 2019-05-09 DIAGNOSIS — G9389 Other specified disorders of brain: Secondary | ICD-10-CM | POA: Diagnosis present

## 2019-05-09 DIAGNOSIS — S069X0A Unspecified intracranial injury without loss of consciousness, initial encounter: Secondary | ICD-10-CM | POA: Diagnosis not present

## 2019-05-09 DIAGNOSIS — R4701 Aphasia: Secondary | ICD-10-CM | POA: Diagnosis present

## 2019-05-09 DIAGNOSIS — E039 Hypothyroidism, unspecified: Secondary | ICD-10-CM | POA: Diagnosis present

## 2019-05-09 DIAGNOSIS — E1169 Type 2 diabetes mellitus with other specified complication: Secondary | ICD-10-CM | POA: Diagnosis not present

## 2019-05-09 DIAGNOSIS — Z8661 Personal history of infections of the central nervous system: Secondary | ICD-10-CM | POA: Diagnosis not present

## 2019-05-09 DIAGNOSIS — Z79899 Other long term (current) drug therapy: Secondary | ICD-10-CM | POA: Diagnosis not present

## 2019-05-09 DIAGNOSIS — E785 Hyperlipidemia, unspecified: Secondary | ICD-10-CM | POA: Diagnosis present

## 2019-05-09 DIAGNOSIS — Z8673 Personal history of transient ischemic attack (TIA), and cerebral infarction without residual deficits: Secondary | ICD-10-CM | POA: Diagnosis not present

## 2019-05-09 DIAGNOSIS — M545 Low back pain: Secondary | ICD-10-CM | POA: Diagnosis present

## 2019-05-09 DIAGNOSIS — R413 Other amnesia: Secondary | ICD-10-CM | POA: Diagnosis present

## 2019-05-09 DIAGNOSIS — R748 Abnormal levels of other serum enzymes: Secondary | ICD-10-CM | POA: Diagnosis present

## 2019-05-09 DIAGNOSIS — R740 Nonspecific elevation of levels of transaminase and lactic acid dehydrogenase [LDH]: Secondary | ICD-10-CM | POA: Diagnosis present

## 2019-05-09 DIAGNOSIS — I10 Essential (primary) hypertension: Secondary | ICD-10-CM | POA: Diagnosis present

## 2019-05-09 DIAGNOSIS — Z887 Allergy status to serum and vaccine status: Secondary | ICD-10-CM | POA: Diagnosis not present

## 2019-05-09 DIAGNOSIS — Z7982 Long term (current) use of aspirin: Secondary | ICD-10-CM | POA: Diagnosis not present

## 2019-05-09 DIAGNOSIS — F419 Anxiety disorder, unspecified: Secondary | ICD-10-CM | POA: Diagnosis present

## 2019-05-09 DIAGNOSIS — Z79890 Hormone replacement therapy (postmenopausal): Secondary | ICD-10-CM | POA: Diagnosis not present

## 2019-05-09 LAB — GLUCOSE, CAPILLARY
Glucose-Capillary: 127 mg/dL — ABNORMAL HIGH (ref 70–99)
Glucose-Capillary: 194 mg/dL — ABNORMAL HIGH (ref 70–99)
Glucose-Capillary: 106 mg/dL — ABNORMAL HIGH (ref 70–99)

## 2019-05-09 LAB — LIPID PANEL
Cholesterol: 145 mg/dL (ref 0–200)
HDL: 44 mg/dL (ref >40)
LDL Cholesterol: 81 mg/dL (ref 0–99)
Total CHOL/HDL Ratio: 3.3 RATIO
Triglycerides: 101 mg/dL (ref <150)
VLDL: 20 mg/dL (ref 0–40)

## 2019-05-09 LAB — RAPID URINE DRUG SCREEN, HOSP PERFORMED
Amphetamines: NOT DETECTED
Barbiturates: NOT DETECTED
Benzodiazepines: NOT DETECTED
Cocaine: NOT DETECTED
Opiates: NOT DETECTED
Tetrahydrocannabinol: NOT DETECTED

## 2019-05-09 LAB — HEMOGLOBIN A1C
Hgb A1c MFr Bld: 7.2 % — ABNORMAL HIGH (ref 4.8–5.6)
Mean Plasma Glucose: 159.94 mg/dL

## 2019-05-09 MED ORDER — VALPROATE SODIUM 500 MG/5ML IV SOLN
500.0000 mg | Freq: Two times a day (BID) | INTRAVENOUS | Status: DC
  Administered 2019-05-10 (×2): 500 mg via INTRAVENOUS
  Filled 2019-05-09 (×4): qty 5

## 2019-05-09 MED ORDER — VALPROATE SODIUM 500 MG/5ML IV SOLN
2000.0000 mg | Freq: Once | INTRAVENOUS | Status: AC
  Administered 2019-05-09: 2000 mg via INTRAVENOUS
  Filled 2019-05-09: qty 20

## 2019-05-09 NOTE — Progress Notes (Signed)
PROGRESS NOTE    Cathy Harrington  GEX:[id number] DOB: Mar 16, 1946 DOA: 05/08/2019 PCP: System, Pcp Not In   Brief Narrative: 73 y.o. 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 She had SLP services after that hospitalization for cognitive/linguistic therapy and was discharged on 11/23/18.   She presented to [facility] after a possible traumatic injury (a jar may have fallen and hit her head), and she developed aphasia.  She had some R facial droop and drooling and then developed twitching of her R face.  Teleneurology was consulted and suggested this could be partial complex status epilepticus; she was given Keppra and Ativan and transferred to [facility] for further neurologic evaluation since she did not return to baseline.  She was pleasant and alert at the time of my evaluation but unable to answer questions.  She intermittently follows commands.  I spoke with her son.  This is how she presented 3 years ago.  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  Her temporal lobes are "basically obliterated."  She was aphasic for weeks to a month and slow regained her speech over time.   She was on Keppra for about a year and she was taken off the [place] about 1 year after prior hospitalization without further seizure activity.  Her son spoke with her about an hour before she went to the hospital yesterday and was normal.  She regained about 90% of her function following last hospitalization - the main deficit is her inability to recall names (people, food, places).  Last time this happened, she reverted to her native language (arabic, [place]); her English is quite good.   ED Course:  [facility] transfer, per Dr. Myna Hidalgo:  She was noted by husband to be acutely aphasic today  with right facial droop and drooling. In the ED, she was having twitching of her right face. Vitals with mild HTN. Labs with slight transaminase elevations, CBC w/ diff normal. Head CT with extensive encephalomalacia on left from 2017, no acute findings.   She was treated with 1 g Keppra and 1 mg Ativan. She continued to be aphasic, case was discussed with Dr. Rory Percy of neuro at [facility] who recommended another 1 mg Ativan and continued ED observation, and admission to [facility] if she does not return to baseline.   She has improved, now speaking 2 words at a time, but unable to form sentence. No fever, cough, SOB, or sick-contacts; COVID is pending but she will need [facility] for neuro either way and will need to be repeated here.    Assessment & Plan:   Principal Problem:   Aphasia Active Problems:   Seizure ([place])   Encephalitis   Diabetes mellitus type 2 in obese (HCC)   Hypothyroidism (acquired)   Aphasia presented to the outside hospital with right facial droop right facial twitching and FACS which has been resolved.  EEG consistent with seizure.  Patient was on [place] as an outpatient which has been stopped a year and a half ago by her physician.  Discussed with patients son.  According to him patient probably had a seizure and then dropped a jar on the floor and most likely she did not have acute.fall on her head.  Continue Keppra. -She was transferred from [facility] to [facility] for further  neurologic evaluation and treatment -She also will need driving restriction for at least 6 months -Seizure precautions -Ativan prn -PT/OT/ST consults ordered pending this has not been done yet since she is on continuous EEG monitoring. -Though her speech is better it is not completely back to baseline she does speak [place] she is not speaking any English now she was pulled something in Arabic.  Has not gotten out of bed since admission to the hospital due to EEG monitoring.  Baseline she is walks around does all her ADLs  by herself.  -Patient needs ongoing hospital stay due to continuous EEG monitoring and PT OT evaluation prior to discharge.   DM -A1c was 7.1 in 6/17, hemoglobin A1c 7.2 today. -Hold Glucotrol and Glucophage -Will cover with moderate-scale SSI  Hypothyroidism continue Synthroid TSH 1.2.  HLD -Continue Mevacor (formulary substitution with Pravastatin) -Check FLP  DVT prophylaxis:  Lovenox  Code Status:   Full  Family Communication: Discussed with patient's son disposition Plan:  Home once clinically improved Consults called: Neurology   Estimated body mass index is 30.47 kg/m as calculated from the following:   Height as of 01/19/18: 5\' 3"  (1.6 m).   Weight as of 05/07/19: 78 kg.    Subjective: Patient resting in bed smiling and talking in a baby language moving all extremities still some weakness in the left upper extremity  Objective: Vitals:   05/08/19 2200 05/09/19 0000 05/09/19 0400 05/09/19 0812  BP: 129/73 (!) 148/80 (!) 141/76 (!) 141/69  Pulse: 76 72 72 76  Resp: 18 19 18 18   Temp: 98.6 F (37 C) 98.6 F (37 C) 98.6 F (37 C) 97.9 F (36.6 C)  TempSrc: Oral Oral Oral Oral  SpO2: 97% 98% 98% 95%    Intake/Output Summary (Last 24 hours) at 05/09/2019 1136 Last data filed at 05/09/2019 0900 Gross per 24 hour  Intake 1210 ml  Output --  Net 1210 ml   There were no vitals filed for this visit.  Examination:  General exam: Appears calm and comfortable  Respiratory system: Clear to auscultation. Respiratory effort normal. Cardiovascular system: S1 & S2 heard, RRR. No JVD, murmurs, rubs, gallops or clicks. No pedal edema. Gastrointestinal system: Abdomen is nondistended, soft and nontender. No organomegaly or masses felt. Normal bowel sounds heard. Central nervous system: Awake speaking Arabic moves all extremities extremities: Trace bilateral pitting edema Skin: No rashes, lesions or ulcers    Data Reviewed: I have personally reviewed following labs and  imaging studies  CBC: Recent Labs  Lab 05/07/19 1642  WBC 6.7  NEUTROABS 3.6  HGB 13.1  HCT 40.7  MCV 87.3  PLT 578   Basic Metabolic Panel: Recent Labs  Lab 05/07/19 1642  NA 142  K 4.1  CL 111  CO2 21*  GLUCOSE 110*  BUN 15  CREATININE 0.76  CALCIUM 9.5   GFR: CrCl cannot be calculated (Unknown ideal weight.). Liver Function Tests: Recent Labs  Lab 05/07/19 1642  AST 42*  ALT 60*  ALKPHOS 61  BILITOT 0.5  PROT 7.8  ALBUMIN 4.7   No results for input(s): LIPASE, AMYLASE in the last 168 hours. No results for input(s): AMMONIA in the last 168 hours. Coagulation Profile: Recent Labs  Lab 05/07/19 1642  INR 0.9   Cardiac Enzymes: No results for input(s): CKTOTAL, CKMB, CKMBINDEX, TROPONINI in the last 168 hours. BNP (last 3 results) No results for input(s): PROBNP in the last 8760 hours. HbA1C: Recent Labs    05/09/19 0724  HGBA1C 7.2*   CBG: Recent Labs  Lab 05/07/19 1632 05/08/19 0727 05/08/19 1019 05/08/19 1724 05/08/19 2146  GLUCAP 104* 103* 127* 110* 138*   Lipid Profile: Recent Labs    05/09/19 0724  CHOL 145  HDL 44  LDLCALC 81  TRIG 101  CHOLHDL 3.3   Thyroid Function Tests: Recent Labs    05/08/19 1505  TSH 1.276  FREET4 1.01   Anemia Panel: No results for input(s): VITAMINB12, FOLATE, FERRITIN, TIBC, IRON, RETICCTPCT in the last 72 hours. Sepsis Labs: No results for input(s): PROCALCITON, LATICACIDVEN in the last 168 hours.  Recent Results (from the past 240 hour(s))  SARS Coronavirus 2 (CEPHEID - Performed in [place] hospital lab), Hosp Order     Status: None   Collection Time: 05/08/19 11:14 AM   Specimen: Nasopharyngeal Swab  Result Value Ref Range Status   SARS Coronavirus 2 NEGATIVE NEGATIVE Final    Comment: (NOTE) If result is NEGATIVE SARS-CoV-2 target nucleic acids are NOT DETECTED. The SARS-CoV-2 RNA is generally detectable in upper and lower  respiratory specimens during the acute phase of  infection. The lowest  concentration of SARS-CoV-2 viral copies this assay can detect is 250  copies / mL. A negative result does not preclude SARS-CoV-2 infection  and should not be used as the sole basis for treatment or other  patient management decisions.  A negative result may occur with  improper specimen collection / handling, submission of specimen other  than nasopharyngeal swab, presence of viral mutation(s) within the  areas targeted by this assay, and inadequate number of viral copies  (<250 copies / mL). A negative result must be combined with clinical  observations, patient history, and epidemiological information. If result is POSITIVE SARS-CoV-2 target nucleic acids are DETECTED. The SARS-CoV-2 RNA is generally detectable in upper and lower  respiratory specimens dur ing the acute phase of infection.  Positive  results are indicative of active infection with SARS-CoV-2.  Clinical  correlation with patient history and other diagnostic information is  necessary to determine patient infection status.  Positive results do  not rule out bacterial infection or co-infection with other viruses. If result is PRESUMPTIVE POSTIVE SARS-CoV-2 nucleic acids MAY BE PRESENT.   A presumptive positive result was obtained on the submitted specimen  and confirmed on repeat testing.  While 2019 novel coronavirus  (SARS-CoV-2) nucleic acids may be present in the submitted sample  additional confirmatory testing may be necessary for epidemiological  and / or clinical management purposes  to differentiate between  SARS-CoV-2 and other Sarbecovirus currently known to infect humans.  If clinically indicated additional testing with an alternate test  methodology [phone]) is advised. The SARS-CoV-2 RNA is generally  detectable in upper and lower respiratory sp ecimens during the acute  phase of infection. The expected result is Negative. Fact Sheet for Patients:   StrictlyIdeas.no Fact Sheet for Healthcare Providers: BankingDealers.co.za This test is not yet approved or cleared by the [place] FDA and has been authorized for detection and/or diagnosis of SARS-CoV-2 by FDA under an Emergency Use Authorization (EUA).  This EUA will remain in effect (meaning this test can be used) for the duration of the COVID-19 declaration under Section 564(b)(1) of the Act, 21 U.S.C. section 360bbb-3(b)(1), unless the authorization is terminated or revoked sooner. Performed at [facility], [place] [address], [place], [place] [address]          Radiology Studies: Ct Head Code Stroke Wo Contrast  Addendum  Date: 05/07/2019   ADDENDUM REPORT: 05/07/2019 17:14 ADDENDUM: Study discussed by telephone with Dr. Nance Pear on 05/07/2019 at 1703 hours. Electronically Signed   By: Genevie Ann M.D.   On: 05/07/2019 17:14   Result Date: 05/07/2019 CLINICAL DATA:  Code stroke. 73 year old female reportedly status post blunt trauma, but loss of speech and right side weakness. EXAM: CT HEAD WITHOUT CONTRAST TECHNIQUE: Contiguous axial images were obtained from the base of the skull through the vertex without intravenous contrast. COMPARISON:  Brain MRI 06/18/2016 and earlier. 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 There is also some associated encephalomalacia of the anterior left cingulate gyrus. Associated mild ex vacuo enlargement now of the left lateral ventricle. Associated white matter hypodensity about the left frontal horn. Elsewhere gray-white matter differentiation is preserved. No midline shift, ventriculomegaly, mass effect, evidence of mass lesion, intracranial hemorrhage or evidence of cortically based acute infarction. Vascular: Calcified atherosclerosis at the skull base. No suspicious intracranial vascular  hyperdensity. Skull: Stable and negative. Sinuses/Orbits: Visualized paranasal sinuses and mastoids are stable and well pneumatized. Other: Visualized orbits and scalp soft tissues are within normal limits. ASPECTS [facility] Stroke Program Early CT Score) - Ganglionic level infarction (caudate, lentiform nuclei, internal capsule, insula, M1-M3 cortex): 7 - Supraganglionic infarction (M4-M6 cortex): 3 Total score (0-10 with 10 being normal): 10 (extensive chronic left temporal lobe and insula encephalomalacia). IMPRESSION: 1. Extensive left temporal lobe, insula and also left cingulate gyrus encephalomalacia appears related to the 2017 insult. 2. No acute cortically based infarct or acute intracranial hemorrhage identified. ASPECTS is 10. Electronically Signed: By: Genevie Ann M.D. On: 05/07/2019 16:58        Scheduled Meds:  aspirin EC  81 mg Oral Daily   enoxaparin (LOVENOX) injection  40 mg Subcutaneous Q24H   insulin aspart  0-15 Units Subcutaneous TID WC   insulin aspart  0-5 Units Subcutaneous QHS   levothyroxine  100 mcg Oral Q0600   pravastatin  10 mg Oral q1800   Continuous Infusions:  sodium chloride 75 mL/hr (05/08/19 1353)   levETIRAcetam 1,000 mg (05/09/19 0338)     LOS: 0 days    Georgette Shell, MD [facility]  If 7PM-7AM, please contact night-coverage [email or web address] Password [email or web address] 05/09/2019, 11:36 AM

## 2019-05-09 NOTE — Progress Notes (Addendum)
Subjective: Patient was sitting in bed, appeared comfortable. Did respond with good morning after hearing good morning.  Son reports that patient speaks fluent english, she was more comfortable in Arabic when she had encephalitis in 2017.   Exam: Vitals:   05/09/19 0400 05/09/19 0812  BP: (!) 141/76 (!) 141/69  Pulse: 72 76  Resp: 18 18  Temp: 98.6 F (37 C) 97.9 F (36.6 C)  SpO2: 98% 95%   Gen: In bed, NAD Resp: non-labored breathing, no acute distress Abd: soft, nt  Neuro: MS: Awake, alert, not able to answer questions, occasionally follows commands/mimics behavior CN: PEERLA, EOMI, extrudes tongue. Limited due to not following commands Motor: Moves all extremities, at least 4/5 but limited due to not following commands Sensory: Responds to painful stimulation DTR:2+ in upper and lower extremities Planter: Downgoing bilaterally  Pertinent Labs: UDS: Negative  LTM: This day 1 of continuous EEG monitoring with simultaneous video monitoring did not record any clinical or subclinical seizures.  Background activities were abnormal for several reasons.  #1 background activity slowing across left hemisphere suggestive of neuronal dysfunction on a structural vascular or degenerative basis.  Improved during second half of the recording.  #2 superimposed left mid temporal spikes as well as periodic  epileptiform discharges suggestive of cortical irritability in the left temporal region.  Continuous monitoring is recommended to ensure improvement.  Clinical correlation is advised.  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 She has not had any episodes since that time.   She  presented after a possible traumatic injury with confusion, aphasic, right facial droop and right sided weakness. 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 She had been placed on LTM which showed improvement in the activity. Today she still has expressive aphasia, exam appears unchanged from yesterdays exam however was able to say 1-2 words.   Recommendations: 1) Continue Keppra 1 g BID 2) Continue LTM, possibly discontinue tomorrow  3) Obtain MRI 4) Will continue to follow   Cathy Harrington, M.D. PGY2 05/09/2019 12:20 PM

## 2019-05-09 NOTE — Progress Notes (Signed)
LTM maintenance completed; fixed A1 and P3; no skin breakdown was seen.

## 2019-05-09 NOTE — Procedures (Signed)
ncephalogram report- LTM with VIDEO   Beginning time: 05/08/19 at 34 Ending time: 05/09/19 at 0730  CPT/type : 95720  Day of study: day 1    Technical Description: The EEG was performed using standard setting per the guidelines of American Clinical Neurophysiology Society (ACNS).    A minimum of 21 electrodes were placed on scalp according to the International 10-20 or/and 10-10 Systems. Supplemental electrodes were placed as needed. Single EKG electrode was also used to detect cardiac arrhythmia. Patient's behavior was continuously recorded on video simultaneously with EEG. A minimum of 16 channels were used for data display. Each epoch of study was reviewed manually daily and as needed using standard referential and bipolar montages. Computerized quantitative EEG analysis (such as compressed spectral array analysis, trending, automated spike & seizure detection) were used as indicated.    Spike detection: ON  Seizure detection: ON   This  continuous  EEG monitoring with simultaneous video monitoring was performed for this patient with spells convulsions to rule out subclinical seizures. There was no pushbutton activations events during this recording.  Background activities marked by 9 to 10 cps waking background activities across right hemisphere.  Persistent left hemispheric slowing present throughout the recording although improved during second half of the recording.  Superimposed left mid temporal sharp waves and spikes as well as periodic epileptiform discharges maximum negativity at the left mid temporal region present throughout the recording activated by drowsiness and sleep.  No clinical or subclinical seizures.   Clinical interpretation: This day 1 of continuous EEG monitoring with simultaneous video monitoring did not record any clinical or subclinical seizures.  Background activities were abnormal for several reasons.  #1 background activity slowing across left hemisphere  suggestive of neuronal dysfunction on a structural vascular or degenerative basis.  Improved during second half of the recording.  #2 superimposed left mid temporal spikes as well as periodic  epileptiform discharges suggestive of cortical irritability in the left temporal region.  Continuous monitoring is recommended to ensure improvement.  Clinical correlation is advised.

## 2019-05-09 NOTE — Evaluation (Signed)
Occupational Therapy Evaluation Patient Details Name: Cathy Harrington MRN: [id number] DOB: Dec 29, 1945 Today's Date: 05/09/2019    History of Present Illness Pt is a 73yo female admitted with aphasia. PMH: DM2, hypothyroidism, HSV encephalitis in 2017,Pt admitted to [facility] s/p possible TBI from a jar falling on her head and then developing pahasa. Pt with Noted R facial droop and twictchin of R face. Pt now on continuous EEG with noted abnomalities in L hemisphere.   Clinical Impression   Pt admitted with above. She demonstrates the below listed deficits and will benefit from continued OT to maximize safety and independence with BADLs.  Pt presents to OT with impaired communication - she is currently speaking Arabic, and per spouse perseverating on a word that means, a wood slab placed between mattresses.  She will follow one step motor commands when gestural cues provided.  She demonstrates ideational apraxia - is not able to demonstrate use of comb or toothbrush, and when hand over hand assist provided to brush teeth, she attempts to eat the toothpaste.   Currently, she requires max - total A for ADLs, and min A for functional transfers.  She lives with spouse, who is very supportive, and was fully independent PTA, including driving.  Anticipate she will need CIR level therapies at discharge.       Follow Up Recommendations  CIR    Equipment Recommendations  None recommended by OT    Recommendations for Other Services       Precautions / Restrictions Precautions Precautions: Fall Restrictions Weight Bearing Restrictions: No      Mobility Bed Mobility Overal bed mobility: Needs Assistance Bed Mobility: Supine to Sit     Supine to sit: Min assist     General bed mobility comments: requires assist to initiate and gestures   Transfers                      Balance                                           ADL either performed or assessed with clinical  judgement   ADL Overall ADL's : Needs assistance/impaired Eating/Feeding: Minimal assistance;Bed level   Grooming: Wash/dry hands;Wash/dry face;Oral care;Brushing hair;Total assistance;Bed level Grooming Details (indicate cue type and reason): Pt does not recognize and is not able to demonstrate use of comb or toothbrush.  When hand over hand assist provided, she was unable to brush her teeth, and procceded to try to eat the toothpaste  Upper Body Bathing: Total assistance;Bed level   Lower Body Bathing: Total assistance;Bed level   Upper Body Dressing : Total assistance;Bed level   Lower Body Dressing: Total assistance;Bed level   Toilet Transfer: Minimal assistance;Stand-pivot;BSC   Toileting- Clothing Manipulation and Hygiene: Maximal assistance;Sit to/from stand       Functional mobility during ADLs: Minimal assistance General ADL Comments: Pt limited by communication deficits as well as ideational apraxia.  LT EEG monitoring also makes it difficult to engage pt in full ADL      Vision   Additional Comments: Pt unable to participate in formal visual assessment      Perception Perception Perception Tested?: No Comments: unable to accurately assess    Praxis Praxis Praxis tested?: Deficits Deficits: Initiation;Perseveration Praxis-Other Comments: Pt does not recognize how to use a comb or toothbrush.  Attempted to eat the toothpaste  Pertinent Vitals/Pain Pain Assessment: 0-10 Faces Pain Scale: No hurt     Hand Dominance Right   Extremity/Trunk Assessment Upper Extremity Assessment Upper Extremity Assessment: Generalized weakness   Lower Extremity Assessment Lower Extremity Assessment: Defer to PT evaluation       Communication Communication Communication: Receptive difficulties;Expressive difficulties(pt currently is speakin in Arabic, but not making sense )   Cognition Arousal/Alertness: Awake/alert Behavior During Therapy: Impulsive Overall Cognitive  Status: Difficult to assess                                 General Comments: Pt demonstrates significant communication deficits.  She is currently speaking Arabic, per spouse, but her language, for the most part does not make sense. He reports she keeps repeating the word for a slab of wood used between the mattresses.   She was able to say her name in [place] x 1 when asked.  She will follow simple motor commands if gestures provided, but does not follow them if only provided verbally.  She also demonstrates significant ideational apraxia    General Comments  Pt on LTM EEG.  Spouse present     Exercises     Shoulder Instructions      Home Living Family/patient expects to be discharged to:: Private residence Living Arrangements: Spouse/significant other Available Help at Discharge: Family Type of Home: House Home Access: Stairs to enter Technical brewer of Steps: 4 Entrance Stairs-Rails: Right Home Layout: Laundry or work area in basement;Able to live on main level with bedroom/bathroom;Two level Alternate Level Stairs-Number of Steps: flight Alternate Level Stairs-Rails: Right Bathroom Shower/Tub: Occupational psychologist: Standard     Home Equipment: None          Prior Functioning/Environment Level of Independence: Independent        Comments: short distance driving, completely indep        OT Problem List: Decreased activity tolerance;Impaired balance (sitting and/or standing);Decreased coordination;Decreased cognition;Decreased safety awareness      OT Treatment/Interventions: Self-care/ADL training;Neuromuscular education;DME and/or AE instruction;Visual/perceptual remediation/compensation;Cognitive remediation/compensation;Therapeutic activities;Patient/family education;Balance training    OT Goals(Current goals can be found in the care plan section) Acute Rehab OT Goals Patient Stated Goal: to understand what she is trying to say   OT Goal Formulation: With family Time For Goal Achievement: 05/23/19 Potential to Achieve Goals: Good ADL Goals Pt Will Perform Eating: with set-up;with supervision;sitting Pt Will Perform Grooming: with min assist;standing Pt Will Perform Upper Body Bathing: with supervision;with set-up;sitting Pt Will Perform Lower Body Bathing: with min assist;sit to/from stand Pt Will Perform Upper Body Dressing: with min assist;sitting Pt Will Perform Lower Body Dressing: with min assist;sit to/from stand Pt Will Transfer to Toilet: with min guard assist;ambulating;grab bars Pt Will Perform Toileting - Clothing Manipulation and hygiene: with min assist;sit to/from stand  OT Frequency: Min 2X/week   Barriers to D/C:            Co-evaluation              AM-PAC OT "6 Clicks" Daily Activity     Outcome Measure Help from another person eating meals?: A Little Help from another person taking care of personal grooming?: Total Help from another person toileting, which includes using toliet, bedpan, or urinal?: A Lot Help from another person bathing (including washing, rinsing, drying)?: A Lot Help from another person to put on and taking off regular upper body clothing?: Total Help  from another person to put on and taking off regular lower body clothing?: Total 6 Click Score: 10   End of Session Nurse Communication: Mobility status  Activity Tolerance: Patient tolerated treatment well Patient left: in bed;with call bell/phone within reach;with family/visitor present  OT Visit Diagnosis: Apraxia (R48.2);Cognitive communication deficit (R41.841)                Time: 9470-7615 OT Time Calculation (min): 21 min Charges:  OT General Charges $OT Visit: 1 Visit OT Evaluation $OT Eval Moderate Complexity: 1 Mod  Lucille Passy, OTR/L Acute Rehabilitation Services Pager [phone] Office [phone]   Lucille Passy M 05/09/2019, 3:36 PM

## 2019-05-09 NOTE — Evaluation (Signed)
Physical Therapy Evaluation Patient Details Name: Cathy Harrington MRN: [id number] DOB: 12/22/1945 Today's Date: 05/09/2019   History of Present Illness  Pt is a 73yo female admitted with aphasia. PMH: DM2, hypothyroidism, HSV encephalitis in 2017,Pt admitted to [facility] s/p possible TBI from a jar falling on her head and then developing pahasa. Pt with Noted R facial droop and twictchin of R face. Pt now on continuous EEG with noted abnomalities in L hemisphere.  Clinical Impression  Pt presenting with above. Mobility limited by continuous EEG today. Pt also speaking in Arabic but is fluent in [place] typically (pt is fluent in 4 different languages). Spoke with patients son who states that this is exactly what happened back in 2017. Son spoke to patient on the phone and stated the patient was not speaking intelligently, that is was mostly jargon despite the fluidity. Pt with impaired command follow, impulsivity, decreased insight to deficits, aphasic, impaired sequencing, and ideational apraxia. Recommending CIR upon d/c. Patient went to [facility] rehab back in 2017, this is the families preference as the son works at [employer] and she has family there. Acute PT to cont to follow.    Follow Up Recommendations CIR(however prefers [facility] rehab (son works there))    Equipment Recommendations  (TBD)    Recommendations for Other Services Rehab consult     Precautions / Restrictions Precautions Precautions: Fall Precaution Comments: as reverted back to Arabic Restrictions Weight Bearing Restrictions: No      Mobility  Bed Mobility Overal bed mobility: Needs Assistance Bed Mobility: Supine to Sit     Supine to sit: Min assist     General bed mobility comments: with demonstration pt initiated transfer to EOB and requiring more tactile cues to complete task than physical assist  Transfers Overall transfer level: Needs assistance Equipment used: 1 person hand held assist Transfers: Sit to/from [employer] Sit to Stand: Min assist Stand pivot transfers: Min assist       General transfer comment: minA for safety, pt with decreased insight to deficts and EEG wires attached to her  Ambulation/Gait             General Gait Details: unable due to EEG  Stairs            Wheelchair Mobility    Modified Rankin (Stroke Patients Only) Modified Rankin (Stroke Patients Only) Pre-Morbid Rankin Score: No significant disability Modified Rankin: Moderate disability     Balance Overall balance assessment: Needs assistance Sitting-balance support: Feet supported;No upper extremity supported Sitting balance-Leahy Scale: Fair     Standing balance support: No upper extremity supported Standing balance-Leahy Scale: Fair Standing balance comment: pt impulsive and reaching for chair, needs physical assist for safetly                             Pertinent Vitals/Pain Pain Assessment: 0-10 Faces Pain Scale: No hurt    Home Living Family/patient expects to be discharged to:: Private residence Living Arrangements: Spouse/significant other Available Help at Discharge: Family Type of Home: House Home Access: Stairs to enter Entrance Stairs-Rails: Right Entrance Stairs-Number of Steps: 4 Home Layout: Laundry or work area in basement;Able to live on main level with bedroom/bathroom;Two level Home Equipment: None      Prior Function Level of Independence: Independent         Comments: short distance driving, completely indep     Hand Dominance   Dominant Hand: Right  Extremity/Trunk Assessment   Upper Extremity Assessment Upper Extremity Assessment: Generalized weakness(appears to have ideational apraxia in R UE)    Lower Extremity Assessment Lower Extremity Assessment: Generalized weakness    Cervical / Trunk Assessment Cervical / Trunk Assessment: Normal  Communication   Communication: Receptive difficulties;Expressive  difficulties(speaking in Arabic)  Cognition Arousal/Alertness: Awake/alert Behavior During Therapy: Impulsive Overall Cognitive Status: Impaired/Different from baseline Area of Impairment: Safety/judgement;Problem solving;Memory;Following commands                     Memory: Decreased short-term memory Following Commands: Follows one step commands inconsistently Safety/Judgement: Decreased awareness of deficits;Decreased awareness of safety   Problem Solving: Slow processing;Decreased initiation;Difficulty sequencing;Requires verbal cues;Requires tactile cues General Comments: pt speaking in another language, son reports "she reverted back to Arabic" last time this happened. Pt able to follow demonstration like sitting up on EOB and getting to chari but no other commands, did not return demonstrate fine motor tasks with hands or marching in sitting position      General Comments General comments (skin integrity, edema, etc.): pt on continuous EEG    Exercises     Assessment/Plan    PT Assessment Patient needs continued PT services  PT Problem List Decreased strength;Decreased range of motion;Decreased activity tolerance;Decreased balance;Decreased mobility;Decreased coordination;Decreased cognition;Decreased knowledge of use of DME;Decreased safety awareness       PT Treatment Interventions DME instruction;Gait training;Stair training;Functional mobility training;Therapeutic activities;Therapeutic exercise;Balance training;Neuromuscular re-education;Cognitive remediation;Patient/family education    PT Goals (Current goals can be found in the Care Plan section)  Acute Rehab PT Goals Patient Stated Goal: didn't state PT Goal Formulation: With family Time For Goal Achievement: 05/23/19 Potential to Achieve Goals: Good    Frequency Min 4X/week   Barriers to discharge        Co-evaluation               AM-PAC PT "6 Clicks" Mobility  Outcome Measure Help needed  turning from your back to your side while in a flat bed without using bedrails?: A Little Help needed moving from lying on your back to sitting on the side of a flat bed without using bedrails?: A Little Help needed moving to and from a bed to a chair (including a wheelchair)?: A Little Help needed standing up from a chair using your arms (e.g., wheelchair or bedside chair)?: A Little Help needed to walk in hospital room?: A Lot Help needed climbing 3-5 steps with a railing? : A Lot 6 Click Score: 16    End of Session Equipment Utilized During Treatment: Gait belt Activity Tolerance: Patient tolerated treatment well Patient left: in chair;with call bell/phone within reach;with chair alarm set Nurse Communication: Mobility status PT Visit Diagnosis: Unsteadiness on feet (R26.81);Difficulty in walking, not elsewhere classified (R26.2)    Time: 9476-5465 PT Time Calculation (min) (ACUTE ONLY): 40 min   Charges:   PT Evaluation $PT Eval Moderate Complexity: 1 Mod PT Treatments $Gait Training: 8-22 mins        Kittie Plater, PT, DPT Acute Rehabilitation Services Pager #: [phone] Office #: [phone]   Berline Lopes 05/09/2019, 10:23 AM

## 2019-05-09 NOTE — Progress Notes (Addendum)
Rehab Admissions Coordinator Note:  Patient was screened by Cleatrice Burke for appropriateness for an Inpatient Acute Rehab Consult per PT recs. Noted family prefers [facility] therefore we will not consult at this time.  Cleatrice Burke RN MSN 05/09/2019, 10:34 AM  I can be reached at [phone].

## 2019-05-09 NOTE — Progress Notes (Signed)
  Speech Language Pathology Treatment: Cognitive-Linquistic(Aphasia )  Patient Details Name: Cathy Harrington MRN: [id number] DOB: [birth year] Today's Date: 05/09/2019 Time: 5176-1607 SLP Time Calculation (min) (ACUTE ONLY): 43 min  Assessment / Plan / Recommendation Clinical Impression   Pt is a multilingual speaker of English, Arabic, [place], and Hebrew. At the beginning of the session the pt was communicating in [place] and produced some short incomplete phrases in [place] such as, "My husband American since he's", "his sister", and "[place]". However, she then began speaking and responding in another language which was perceived/presumed, by this monolingual [place] language speaker, to possibly be Arabic. [employer] Arabic Interpreter, Cathy Harrington (ID [phone]) was therefore used for interpretation and translation for the rest of the session. She achieved 0% accuracy with confronational naming, automatic sequences, object identification, 1-step commands despite mod-max cues but she demonstrated 20% accuracy with simple yes/no questions and demonstrated use of one object.   Her performance during strutured lanaguage tasks did not improve with use of the  interpreter; however, spontaneous speech was more meaningful with fewer syntactic errors in Arabic than in [place]. With multiple attempts and  additional processing time throughout the session, pt was able to restructure the phrase "Gaspar Cola" to "My youngest sister in studied at a university in [place]". Upon further inquiry about this sister she also stated, "My younger sister she married before me and has so much money". Utterances with reduced meaning such as, "My mother, my hair, the chewing gum" were still noted but she could have been referring to EEG leads which were stuck in her hair. The interpreter questioned whether the pt may have been speaking an [place] dialect due to her not understanding some of the words but from her description  of the errors, they may have been phonemic and neologistic paraphasias. SLP will continue to follow pt and may use an [place] interpreter during the next session to further investigate this possibility.     HPI HPI: This is a 73 year old woman who was at her baseline until she became unable to speak and she appeared confused. Apparently a container with rice fell on her head and she had no overt signs of trauma. She was described as having a right facial droop and right-sided weakness when she came in. 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 She has had continuing difficulties with finding words and naming objects. She has had seizures before. MD observed seizure in room with pt and she was loaded with Keppra.       SLP Plan  Continue with current plan of care       Recommendations                   Follow up Recommendations: Inpatient Rehab SLP Visit Diagnosis: Aphasia (R47.01) Plan: Continue with current plan of care       Gabrella Harrington I. Cathy Harrington, [place], [place] Office number [phone] Pager [place] 05/09/2019, 4:54 PM

## 2019-05-10 ENCOUNTER — Telehealth: Payer: Self-pay | Admitting: Emergency Medicine

## 2019-05-10 ENCOUNTER — Inpatient Hospital Stay (HOSPITAL_COMMUNITY): Payer: Medicare Other

## 2019-05-10 ENCOUNTER — Other Ambulatory Visit: Payer: Self-pay

## 2019-05-10 ENCOUNTER — Encounter (HOSPITAL_COMMUNITY): Payer: Self-pay | Admitting: *Deleted

## 2019-05-10 LAB — GLUCOSE, CAPILLARY
Glucose-Capillary: 132 mg/dL — ABNORMAL HIGH (ref 70–99)
Glucose-Capillary: 138 mg/dL — ABNORMAL HIGH (ref 70–99)
Glucose-Capillary: 162 mg/dL — ABNORMAL HIGH (ref 70–99)
Glucose-Capillary: 135 mg/dL — ABNORMAL HIGH (ref 70–99)
Glucose-Capillary: 127 mg/dL — ABNORMAL HIGH (ref 70–99)
Glucose-Capillary: 198 mg/dL — ABNORMAL HIGH (ref 70–99)
Glucose-Capillary: 140 mg/dL — ABNORMAL HIGH (ref 70–99)
Glucose-Capillary: 183 mg/dL — ABNORMAL HIGH (ref 70–99)

## 2019-05-10 LAB — NOVEL CORONAVIRUS, NAA (HOSP ORDER, SEND-OUT TO REF LAB; TAT 18-24 HRS): SARS-CoV-2, NAA: NOT DETECTED

## 2019-05-10 LAB — VALPROIC ACID LEVEL: Valproic Acid Lvl: 46 ug/mL — ABNORMAL LOW (ref 50.0–100.0)

## 2019-05-10 NOTE — Progress Notes (Signed)
Inpatient Rehabilitation Admissions Coordinator  I clarified with RN CM, Kelli, that pt had previously been to outpt rehab at [facility]. Family would like pt to be considered for inpt rehab at [facility]. We will follow up tomorrow.  Danne Baxter, RN, MSN Rehab Admissions Coordinator [phone] 05/10/2019 6:44 PM

## 2019-05-10 NOTE — Telephone Encounter (Signed)
Called patient to inform of negative covid 19 result.  Left message.

## 2019-05-10 NOTE — Procedures (Signed)
LTM-EEG Report  HISTORY: Continuous video-EEG monitoring performed for 73 year old with aphasia, confusion.  ACQUISITION: International 10-20 system for electrode placement; 18 channels with additional eyes linked to ipsilateral ears and EKG. Additional T1-T2 electrodes were used. Continuous video recording obtained.   EEG NUMBER:  MEDICATIONS:  Day 2: see EMR   DAY #2: from 0730 05/09/19 to 0730 05/10/19 BACKGROUND: An overall medium voltage continuous recording with good spontaneous variability and reactivity. Waking background consisted of a medium voltage 9 Hz posterior dominant rhythm bilaterally with low voltage beta activity in the bilateral frontocentral regions and some medium voltage theta activity diffusely. Sleep was captured with normal stage II sleep architecture. There were occasional runs of focal slowing in the left temporal region. 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 No seizures were seen.

## 2019-05-10 NOTE — Progress Notes (Signed)
Subjective: Patient was sitting in bed, appeared comfortable today. She was able to say some words, including "woke up", "last night", "better", and "at 8". Was not forming clear sentences but was able to follow some commands.   Husband was at bedside later that day, he states that their son spoke with the patient this morning and that he noted some improvement in her speech. He feels that she seems a little better but that she is not back to her baseline.   Exam: Vitals:   05/10/19 0423 05/10/19 0750  BP: (!) 160/87 (!) 160/79  Pulse: 74 73  Resp: 18 16  Temp: 98 F (36.7 C) 98.3 F (36.8 C)  SpO2: 100% 98%   Gen: In bed, NAD Resp: non-labored breathing, no acute distress Abd: soft, nt  Neuro: MS: Awake, alert, not answering questions, more verbal this morning, able to intermittently follow some commands but only gestured commands. Perseverating on certain words.  CN: PERRLA, EOMI, no facial asymmetry, sensation unable to assess. Limited due to not following commands Motor: Moves all extremities, at least 4/5 but limited due to not following commands Sensory: Responds to painful stimuli DTR: 2+ in upper and lower extremities Planter: Downgoing bilaterally  Pertinent Labs:  LTM-EEG: DAY #2: from 0730 05/09/19 to 0730 05/10/19 BACKGROUND: An overall medium voltage continuous recording with good spontaneous variability and reactivity. Waking background consisted of a medium voltage 9 Hz posterior dominant rhythm bilaterally with low voltage beta activity in the bilateral frontocentral regions and some medium voltage theta activity diffusely. Sleep was captured with normal stage II sleep architecture. There were occasional runs of focal slowing in the left temporal region. 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 No seizures were seen.  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 She has not had any episodes since that time.   She presented after a possible traumatic injury with confusion, aphasic, right facial droop and right sided weakness. 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 She continued to have discharges on EEG yesterday, so she was loaded with depakote at 2g at [facility] last night. This morning patient is still having receptive aphasia, she is able to say some words today, seems improved from yesterday. LTM showed no evidence of continued seizures, however did show focal slowing with epileptiform discharges in left mid temporal region.   Given her slow response and that she had not had any issues prior to the trauma there is concern that there could be another underlying condition in addition to her seizures. Will need brain MRI today.   Recommendations: 1) Continue Keppra 1 g BID 2) Continue depakote 500 mg BID, s/p 2g depakote load, f/u depakote level today 3) Discontinue LTM 4) Order MRI brain today 4) Will continue to follow  Asencion Noble, M.D. PGY2 05/10/2019 9:03 AM

## 2019-05-10 NOTE — Progress Notes (Signed)
LTM maint. A2 no skin breakdown.

## 2019-05-10 NOTE — Progress Notes (Signed)
Physical Therapy Treatment Patient Details Name: TAQUITA DEMBY MRN: [id number] DOB: [birth year] Today's Date: 05/10/2019    History of Present Illness Pt is a 73yo female admitted with aphasia. PMH: DM2, hypothyroidism, HSV encephalitis in 2017,Pt admitted to [facility] s/p possible TBI from a jar falling on her head and then developing pahasa. Pt with Noted R facial droop and twictchin of R face. Pt now on continuous EEG with noted abnomalities in L hemisphere.    PT Comments    Patient seen for mobility progression. Pt presents with impaired communication and balance and requires grossly min A for gait training with increased assist at times for balance. Continue to recommend CIR level therapies for further skilled PT services to maximize independence and safety with mobility.    Follow Up Recommendations  CIR     Equipment Recommendations  (TBD)    Recommendations for Other Services Rehab consult     Precautions / Restrictions Precautions Precautions: Fall Precaution Comments: has reverted back to Arabic Restrictions Weight Bearing Restrictions: No    Mobility  Bed Mobility Overal bed mobility: Needs Assistance Bed Mobility: Supine to Sit     Supine to sit: Min guard     General bed mobility comments: min guard assist for safety and increased time and effort needed  Transfers Overall transfer level: Needs assistance Equipment used: (assist at trunk with gait belt ) Transfers: Sit to/from Stand Sit to Stand: Min assist         General transfer comment: assist for balance  Ambulation/Gait Ambulation/Gait assistance: Min assist;Mod assist Gait Distance (Feet): 120 Feet Assistive device: 1 person hand held assist(assist at trunk with gait belt) Gait Pattern/deviations: Step-through pattern;Decreased stride length;Drifts right/left   Gait velocity interpretation: <1.31 ft/sec, indicative of household ambulator General Gait Details: assist for balance especially with  head turns and directional changes   Stairs             Wheelchair Mobility    Modified Rankin (Stroke Patients Only) Modified Rankin (Stroke Patients Only) Pre-Morbid Rankin Score: No significant disability Modified Rankin: Moderately severe disability     Balance Overall balance assessment: Needs assistance Sitting-balance support: Feet supported;No upper extremity supported Sitting balance-Leahy Scale: Good     Standing balance support: No upper extremity supported Standing balance-Leahy Scale: Fair                              Cognition Arousal/Alertness: Awake/alert Behavior During Therapy: Impulsive Overall Cognitive Status: Impaired/Different from baseline Area of Impairment: Safety/judgement;Problem solving;Memory;Following commands                     Memory: Decreased short-term memory Following Commands: Follows one step commands inconsistently Safety/Judgement: Decreased awareness of deficits;Decreased awareness of safety   Problem Solving: Slow processing;Decreased initiation;Difficulty sequencing;Requires verbal cues;Requires tactile cues General Comments: pt speaking in English at times however not answering questions consistently (answering one way when therapists asks and a different answer when husband asks in Arabic); pt has some confusion      Exercises      General Comments General comments (skin integrity, edema, etc.): spouse present throughout and assisting with interpretation      Pertinent Vitals/Pain Pain Assessment: No/denies pain(pt asked in Arabic)    Home Living                      Prior Function  PT Goals (current goals can now be found in the care plan section) Acute Rehab PT Goals Patient Stated Goal: didn't state Progress towards PT goals: Progressing toward goals    Frequency    Min 4X/week      PT Plan Current plan remains appropriate    Co-evaluation               AM-PAC PT "6 Clicks" Mobility   Outcome Measure  Help needed turning from your back to your side while in a flat bed without using bedrails?: A Little Help needed moving from lying on your back to sitting on the side of a flat bed without using bedrails?: A Little Help needed moving to and from a bed to a chair (including a wheelchair)?: A Little Help needed standing up from a chair using your arms (e.g., wheelchair or bedside chair)?: A Little Help needed to walk in hospital room?: A Little Help needed climbing 3-5 steps with a railing? : A Lot 6 Click Score: 17    End of Session Equipment Utilized During Treatment: Gait belt Activity Tolerance: Patient tolerated treatment well Patient left: in chair;with call bell/phone within reach;with chair alarm set;with nursing/sitter in room;with family/visitor present Nurse Communication: Mobility status PT Visit Diagnosis: Unsteadiness on feet (R26.81);Difficulty in walking, not elsewhere classified (R26.2)     Time: 1610-9604 PT Time Calculation (min) (ACUTE ONLY): 25 min  Charges:  $Gait Training: 23-37 mins                     Earney Navy, PTA Acute Rehabilitation Services Pager: [phone] Office: [phone]     Darliss Cheney 05/10/2019, 1:52 PM

## 2019-05-10 NOTE — Progress Notes (Signed)
PROGRESS NOTE    Cathy Harrington  PHX:[id number] DOB: [birth year] DOA: 05/08/2019 PCP: System, Pcp Not In   Brief Narrative: 73 y.o. 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 She had SLP services after that hospitalization for cognitive/linguistic therapy and was discharged on 11/23/18.   She presented to [facility] after a possible traumatic injury (a jar may have fallen and hit her head), and she developed aphasia.  She had some R facial droop and drooling and then developed twitching of her R face.  Teleneurology was consulted and suggested this could be partial complex status epilepticus; she was given Keppra and Ativan and transferred to [facility] for further neurologic evaluation since she did not return to baseline.  She was pleasant and alert at the time of my evaluation but unable to answer questions.  She intermittently follows commands.  I spoke with her son.  This is how she presented 3 years ago.  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  Her temporal lobes are "basically obliterated."  She was aphasic for weeks to a month and slow regained her speech over time.   She was on Keppra for about a year and she was taken off the [place] about 1 year after prior hospitalization without further seizure activity.  Her son spoke with her about an hour before she went to the hospital yesterday and was normal.  She regained about 90% of her function following last hospitalization - the main deficit is her inability to recall names (people, food, places).  Last time this happened, she reverted to her native language (arabic, [place]); her English is quite good.   ED Course:  [facility] transfer, per Dr. Myna Hidalgo:  She was noted by husband to be acutely aphasic today  with right facial droop and drooling. In the ED, she was having twitching of her right face. Vitals with mild HTN. Labs with slight transaminase elevations, CBC w/ diff normal. Head CT with extensive encephalomalacia on left from 2017, no acute findings.   She was treated with 1 g Keppra and 1 mg Ativan. She continued to be aphasic, case was discussed with Dr. Rory Percy of neuro at [facility] who recommended another 1 mg Ativan and continued ED observation, and admission to [facility] if she does not return to baseline.   She has improved, now speaking 2 words at a time, but unable to form sentence. No fever, cough, SOB, or sick-contacts; COVID is pending but she will need [facility] for neuro either way and will need to be repeated here.    Assessment & Plan:   Principal Problem:   Aphasia Active Problems:   Seizure ([place])   Encephalitis   Diabetes mellitus type 2 in obese (HCC)   Hypothyroidism (acquired)   Aphasia presented to the outside hospital with right facial droop right facial twitching and aphasia.  Not back to baseline yet.  EEG consistent with seizure.  Patient was on [place] as an outpatient which has been stopped a year and a half ago by her physician.She was transferred from [facility] to [facility] for further neurologic evaluation and treatment -PT/OT/ST consults ordered pending this has not been done yet since she is on continuous EEG monitoring has been just stopped today. -Though her speech is better it is not completely  back to baseline .  She said she is feeling better today and keeps repeating the word better.  MRI ordered by neurology. -Patient was started on Depakote 500 twice daily in addition to Keppra 1 g twice daily due to her ongoing difficulty in speech and confusion.  Follow-up MRI today.  DM -A1c was 7.1 in 6/17, hemoglobin A1c 7.2 today. -Hold Glucotrol and Glucophage -Will cover with moderate-scale SSI  Hypothyroidism continue Synthroid TSH 1.2.  HLD -Continue Mevacor (formulary  substitution with Pravastatin) -Check FLP  DVT prophylaxis:  Lovenox  Code Status:   Full  Family Communication: Discussed with patient's son disposition Plan:   Await PT OT eval and follow-up MRI today patient may be able to go home tomorrow if she remains stable overnight.    Consults called: Neurology   Estimated body mass index is 30.47 kg/m as calculated from the following:   Height as of 01/19/18: 5\' 3"  (1.6 m).   Weight as of 05/07/19: 78 kg.    Subjective: Patient resting in bed smiling and talking in English but the words are still broken however she said she feels better today  Objective: Vitals:   05/09/19 2346 05/10/19 0423 05/10/19 0750 05/10/19 1203  BP: 138/72 (!) 160/87 (!) 160/79 140/83  Pulse: 75 74 73 90  Resp: 18 18 16 16   Temp: 98.4 F (36.9 C) 98 F (36.7 C) 98.3 F (36.8 C) 98.1 F (36.7 C)  TempSrc: Oral Oral Axillary Oral  SpO2: 97% 100% 98% 98%    Intake/Output Summary (Last 24 hours) at 05/10/2019 1224 Last data filed at 05/10/2019 0514 Gross per 24 hour  Intake 1090 ml  Output -  Net 1090 ml   There were no vitals filed for this visit.  Examination:  General exam: Appears calm and comfortable  Respiratory system: Clear to auscultation. Respiratory effort normal. Cardiovascular system: S1 & S2 heard, RRR. No JVD, murmurs, rubs, gallops or clicks. No pedal edema. Gastrointestinal system: Abdomen is nondistended, soft and nontender. No organomegaly or masses felt. Normal bowel sounds heard. Central nervous system: Awake speaking Arabic moves all extremities dysarthria extremities: Trace bilateral pitting edema Skin: No rashes, lesions or ulcers    Data Reviewed: I have personally reviewed following labs and imaging studies  CBC: Recent Labs  Lab 05/07/19 1642  WBC 6.7  NEUTROABS 3.6  HGB 13.1  HCT 40.7  MCV 87.3  PLT 314   Basic Metabolic Panel: Recent Labs  Lab 05/07/19 1642  NA 142  K 4.1  CL 111  CO2 21*  GLUCOSE 110*   BUN 15  CREATININE 0.76  CALCIUM 9.5   GFR: CrCl cannot be calculated (Unknown ideal weight.). Liver Function Tests: Recent Labs  Lab 05/07/19 1642  AST 42*  ALT 60*  ALKPHOS 61  BILITOT 0.5  PROT 7.8  ALBUMIN 4.7   No results for input(s): LIPASE, AMYLASE in the last 168 hours. No results for input(s): AMMONIA in the last 168 hours. Coagulation Profile: Recent Labs  Lab 05/07/19 1642  INR 0.9   Cardiac Enzymes: No results for input(s): CKTOTAL, CKMB, CKMBINDEX, TROPONINI in the last 168 hours. BNP (last 3 results) No results for input(s): PROBNP in the last 8760 hours. HbA1C: Recent Labs    05/09/19 0724  HGBA1C 7.2*   CBG: Recent Labs  Lab 05/08/19 1724 05/08/19 2146 05/09/19 1210 05/09/19 1650 05/10/19 0748  GLUCAP 110* 138* 194* 106* 127*   Lipid Profile: Recent Labs    05/09/19 0724  CHOL  145  HDL 44  LDLCALC 81  TRIG 101  CHOLHDL 3.3   Thyroid Function Tests: Recent Labs    05/08/19 1505  TSH 1.276  FREET4 1.01   Anemia Panel: No results for input(s): VITAMINB12, FOLATE, FERRITIN, TIBC, IRON, RETICCTPCT in the last 72 hours. Sepsis Labs: No results for input(s): PROCALCITON, LATICACIDVEN in the last 168 hours.  Recent Results (from the past 240 hour(s))  Novel Coronavirus,NAA,(SEND-OUT TO REF LAB - TAT 24-48 hrs); Hosp Order     Status: None   Collection Time: 05/07/19  7:34 PM   Specimen: Nasopharyngeal Swab; Respiratory  Result Value Ref Range Status   SARS-CoV-2, NAA NOT DETECTED NOT DETECTED Final    Comment: (NOTE) This test was developed and its performance characteristics determined by [employer]. This test has not been FDA cleared or approved. This test has been authorized by FDA under an Emergency Use Authorization (EUA). This test is only authorized for the duration of time the declaration that circumstances exist justifying the authorization of the emergency use of in vitro diagnostic tests for detection of  SARS-CoV-2 virus and/or diagnosis of COVID-19 infection under section 564(b)(1) of the Act, 21 U.S.C. 654YTK-3(T)(4), unless the authorization is terminated or revoked sooner. When diagnostic testing is negative, the possibility of a false negative result should be considered in the context of a patient's recent exposures and the presence of clinical signs and symptoms consistent with COVID-19. An individual without symptoms of COVID-19 and who is not shedding SARS-CoV-2 virus would expect to have a negative (not detected) result in this assay. Performed  At: [facility] [address] [place], [place] [id number] Rush Farmer MD ZG:[id number]    [place]  Final    Comment: Performed at [facility], [address] [place], [place] [address]  SARS Coronavirus 2 (CEPHEID - Performed in [facility] hospital lab), Hosp Order     Status: None   Collection Time: 05/08/19 11:14 AM   Specimen: Nasopharyngeal Swab  Result Value Ref Range Status   SARS Coronavirus 2 NEGATIVE NEGATIVE Final    Comment: (NOTE) If result is NEGATIVE SARS-CoV-2 target nucleic acids are NOT DETECTED. The SARS-CoV-2 RNA is generally detectable in upper and lower  respiratory specimens during the acute phase of infection. The lowest  concentration of SARS-CoV-2 viral copies this assay can detect is 250  copies / mL. A negative result does not preclude SARS-CoV-2 infection  and should not be used as the sole basis for treatment or other  patient management decisions.  A negative result may occur with  improper specimen collection / handling, submission of specimen other  than nasopharyngeal swab, presence of viral mutation(s) within the  areas targeted by this assay, and inadequate number of viral copies  (<250 copies / mL). A negative result must be combined with clinical  observations, patient history, and epidemiological information. If result is POSITIVE SARS-CoV-2  target nucleic acids are DETECTED. The SARS-CoV-2 RNA is generally detectable in upper and lower  respiratory specimens dur ing the acute phase of infection.  Positive  results are indicative of active infection with SARS-CoV-2.  Clinical  correlation with patient history and other diagnostic information is  necessary to determine patient infection status.  Positive results do  not rule out bacterial infection or co-infection with other viruses. If result is PRESUMPTIVE POSTIVE SARS-CoV-2 nucleic acids MAY BE PRESENT.   A presumptive positive result was obtained on the submitted specimen  and confirmed on repeat testing.  While 2019 novel coronavirus  (SARS-CoV-2) nucleic acids may be present in the submitted sample  additional confirmatory testing may be necessary for epidemiological  and / or clinical management purposes  to differentiate between  SARS-CoV-2 and other Sarbecovirus currently known to infect humans.  If clinically indicated additional testing with an alternate test  methodology [phone]) is advised. The SARS-CoV-2 RNA is generally  detectable in upper and lower respiratory sp ecimens during the acute  phase of infection. The expected result is Negative. Fact Sheet for Patients:  StrictlyIdeas.no Fact Sheet for Healthcare Providers: BankingDealers.co.za This test is not yet approved or cleared by the [place] FDA and has been authorized for detection and/or diagnosis of SARS-CoV-2 by FDA under an Emergency Use Authorization (EUA).  This EUA will remain in effect (meaning this test can be used) for the duration of the COVID-19 declaration under Section 564(b)(1) of the Act, 21 U.S.C. section 360bbb-3(b)(1), unless the authorization is terminated or revoked sooner. Performed at [facility], [place] [address], [place], [place] [address]          Radiology Studies: No results found.      Scheduled Meds:  . aspirin EC  81 mg Oral Daily  . enoxaparin (LOVENOX) injection  40 mg Subcutaneous Q24H  . insulin aspart  0-15 Units Subcutaneous TID WC  . insulin aspart  0-5 Units Subcutaneous QHS  . levothyroxine  100 mcg Oral Q0600  . pravastatin  10 mg Oral q1800   Continuous Infusions: . sodium chloride 75 mL/hr (05/08/19 1353)  . levETIRAcetam 1,000 mg (05/10/19 1156)  . valproate sodium       LOS: 1 day    Georgette Shell, MD [facility]  If 7PM-7AM, please contact night-coverage [email or web address] Password [facility] 05/10/2019, 12:24 PM

## 2019-05-11 DIAGNOSIS — E669 Obesity, unspecified: Secondary | ICD-10-CM

## 2019-05-11 DIAGNOSIS — E039 Hypothyroidism, unspecified: Secondary | ICD-10-CM

## 2019-05-11 DIAGNOSIS — E1169 Type 2 diabetes mellitus with other specified complication: Secondary | ICD-10-CM

## 2019-05-11 LAB — BASIC METABOLIC PANEL
Anion gap: 10 (ref 5–15)
BUN: 13 mg/dL (ref 8–23)
CO2: 23 mmol/L (ref 22–32)
Calcium: 9.3 mg/dL (ref 8.9–10.3)
Chloride: 107 mmol/L (ref 98–111)
Creatinine, Ser: 0.78 mg/dL (ref 0.44–1.00)
GFR calc Af Amer: >60 mL/min (ref >60)
GFR calc non Af Amer: >60 mL/min (ref >60)
Glucose, Bld: 185 mg/dL — ABNORMAL HIGH (ref 70–99)
Potassium: 4 mmol/L (ref 3.5–5.1)
Sodium: 140 mmol/L (ref 135–145)

## 2019-05-11 LAB — GLUCOSE, CAPILLARY
Glucose-Capillary: 121 mg/dL — ABNORMAL HIGH (ref 70–99)
Glucose-Capillary: 122 mg/dL — ABNORMAL HIGH (ref 70–99)
Glucose-Capillary: 125 mg/dL — ABNORMAL HIGH (ref 70–99)
Glucose-Capillary: 190 mg/dL — ABNORMAL HIGH (ref 70–99)

## 2019-05-11 LAB — VALPROIC ACID LEVEL: Valproic Acid Lvl: 70 ug/mL (ref 50.0–100.0)

## 2019-05-11 MED ORDER — LORAZEPAM 1 MG PO TABS
1.0000 mg | ORAL_TABLET | Freq: Three times a day (TID) | ORAL | Status: DC
  Administered 2019-05-11 – 2019-05-12 (×4): 1 mg via ORAL
  Filled 2019-05-11 (×4): qty 1

## 2019-05-11 MED ORDER — VALPROIC ACID 250 MG PO CAPS
500.0000 mg | ORAL_CAPSULE | Freq: Two times a day (BID) | ORAL | Status: DC
  Administered 2019-05-11: 500 mg via ORAL
  Filled 2019-05-11: qty 2

## 2019-05-11 MED ORDER — VALPROIC ACID 250 MG PO CAPS
750.0000 mg | ORAL_CAPSULE | Freq: Two times a day (BID) | ORAL | Status: DC
  Administered 2019-05-11 – 2019-05-12 (×2): 750 mg via ORAL
  Filled 2019-05-11 (×2): qty 3

## 2019-05-11 MED ORDER — LEVETIRACETAM 500 MG PO TABS
1000.0000 mg | ORAL_TABLET | Freq: Two times a day (BID) | ORAL | Status: DC
  Administered 2019-05-11 – 2019-05-12 (×3): 1000 mg via ORAL
  Filled 2019-05-11 (×3): qty 2

## 2019-05-11 NOTE — Progress Notes (Signed)
Physical Therapy Treatment Patient Details Name: Cathy Harrington MRN: [id number] DOB: [birth year] Today's Date: 05/11/2019    History of Present Illness Pt is a 73yo female admitted with aphasia. PMH: DM2, hypothyroidism, HSV encephalitis in 2017,Pt admitted to [facility] s/p possible TBI from a jar falling on her head and then developing pahasa. Pt with Noted R facial droop and twictchin of R face. Pt now on continuous EEG with noted abnomalities in L hemisphere.    PT Comments    Patient seen for mobility progression. Pt is making progress toward PT goals. Continues to present with unsteady gait and cognitive/communication deficits. Continue to progress as tolerated.    Follow Up Recommendations  CIR     Equipment Recommendations  None recommended by PT    Recommendations for Other Services       Precautions / Restrictions Precautions Precautions: Fall Precaution Comments: has reverted back to Arabic Restrictions Weight Bearing Restrictions: No    Mobility  Bed Mobility Overal bed mobility: Needs Assistance Bed Mobility: Supine to Sit     Supine to sit: Supervision     General bed mobility comments: for safety; pt has no awarness of IV line   Transfers Overall transfer level: Needs assistance Equipment used: (assist at trunk with gait belt ) Transfers: Sit to/from Stand Sit to Stand: Min guard         General transfer comment: min guard for safety  Ambulation/Gait Ambulation/Gait assistance: Min assist;Min guard Gait Distance (Feet): 160 Feet Assistive device: (assist at trunk with gait belt) Gait Pattern/deviations: Step-through pattern;Decreased stride length;Drifts right/left     General Gait Details: narrow BOS and unsteady especially with challenges to balance; assist for balance    Stairs             Wheelchair Mobility    Modified Rankin (Stroke Patients Only) Modified Rankin (Stroke Patients Only) Pre-Morbid Rankin Score: No significant  disability Modified Rankin: Moderately severe disability     Balance Overall balance assessment: Needs assistance Sitting-balance support: Feet supported;No upper extremity supported Sitting balance-Leahy Scale: Good     Standing balance support: No upper extremity supported Standing balance-Leahy Scale: Fair                              Cognition Arousal/Alertness: Awake/alert Behavior During Therapy: WFL for tasks assessed/performed Overall Cognitive Status: Difficult to assess Area of Impairment: Safety/judgement;Problem solving;Memory;Following commands                     Memory: Decreased short-term memory Following Commands: Follows one step commands inconsistently Safety/Judgement: Decreased awareness of deficits;Decreased awareness of safety   Problem Solving: Slow processing;Decreased initiation;Difficulty sequencing;Requires verbal cues General Comments: per husband, pt still has some confusion when he is speaking to her in Arabic; pt speaking in English at times however not answering questions consistently (answering one way when therapists asks and a different answer when husband asks in Arabic)      Exercises      General Comments        Pertinent Vitals/Pain Pain Assessment: No/denies pain    Home Living                      Prior Function            PT Goals (current goals can now be found in the care plan section) Progress towards PT goals: Progressing toward goals  Frequency    Min 4X/week      PT Plan Current plan remains appropriate    Co-evaluation              AM-PAC PT "6 Clicks" Mobility   Outcome Measure  Help needed turning from your back to your side while in a flat bed without using bedrails?: None Help needed moving from lying on your back to sitting on the side of a flat bed without using bedrails?: None Help needed moving to and from a bed to a chair (including a wheelchair)?: A  Little Help needed standing up from a chair using your arms (e.g., wheelchair or bedside chair)?: A Little Help needed to walk in hospital room?: A Little Help needed climbing 3-5 steps with a railing? : A Little 6 Click Score: 20    End of Session Equipment Utilized During Treatment: Gait belt Activity Tolerance: Patient tolerated treatment well Patient left: in chair;with call bell/phone within reach;with chair alarm set;with family/visitor present Nurse Communication: Mobility status PT Visit Diagnosis: Unsteadiness on feet (R26.81);Difficulty in walking, not elsewhere classified (R26.2)     Time: 6314-9702 PT Time Calculation (min) (ACUTE ONLY): 23 min  Charges:  $Gait Training: 23-37 mins                     Earney Navy, PTA Acute Rehabilitation Services Pager: [phone] Office: [phone]     Darliss Cheney 05/11/2019, 4:13 PM

## 2019-05-11 NOTE — Progress Notes (Signed)
Subjective: This morning patient was able to say good morning, and said "I am fine", " Better", "language". She then started to perseverate on talking about the computer at home, that is in arabic. Used a Optometrist for Arabic with still little response.  Husband reports that she is doing a little better today.   Exam: Vitals:   05/11/19 0410 05/11/19 0829  BP: 135/73 111/74  Pulse: 79 82  Resp: 15 15  Temp: 98.2 F (36.8 C) 98.2 F (36.8 C)  SpO2: 98% 99%   Gen: In bed, NAD Resp: non-labored breathing, no acute distress Abd: soft, nt  Neuro: MS: Awake, alert, still only occasionally answering questions, not following commands, not able to identify objects, able to follow gestured commands, still preseverating on specific words but had increased word formation and initally able to say that her language was better CN: PERRLA, EOMI, no facial asymmetry, unable to assess sensation, limited due to not following commands Motor: Moves all extremities, at least 4/5 but limited due to not following commands  Sensory: Responds to painful stimuli in upper and lower extremities DTR: 2+ in upper and lower extremities Planter: Downgoing bilaterally  Pertinent Labs:  LTM-EEG from 7/6-7/7 SUMMARY: This was an abnormal continuous video EEG due to focal slowing with epileptiform discharges in the left mid temporal region, 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 No seizures were seen.  MRI brain 05/10/19:  IMPRESSION: 1. No acute intracranial abnormality. 2. 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 3. Otherwise unremarkable brain MRI for age.   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 Has extensive region of encephalomalacia in her left temporal and  frontal region from prior HSV encephalitis, she had been mute for about a month at that time. She has not had any episodes since that time.   She presented after a possible traumatic injury with confusion, aphasic, right facial droop and right sided weakness. 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 She was continuing to have discharges on her LTM so she was loaded with depakote. She has been having some improvement in her speech however still having receptive aphasia and not following commands. 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 Overall this appears to still be consistent with seizure disorder, however she is having a very slow improvement so she may still be having seizures that were not found on EEG. Depakote level low at 46.   Recommendations: 1) Continue Keppra 1 g BID 2) Increase depakote 750 mg BID, s/p 2g depakote load 3) Add ativan 1 mg q8 hours for now to see if this would help with her progress 4) Will continue to follow  Asencion Noble, M.D. PGY2 05/11/2019 8:56 AM

## 2019-05-11 NOTE — Plan of Care (Signed)
Pt is progressing toward desired goals

## 2019-05-11 NOTE — Progress Notes (Signed)
Inpatient Rehabilitation Admissions Coordinator  Inpatient Rehab Consult received. I met with patient at the bedside with her spouse for rehabilitation assessment. We discussed goals and expectations of an inpatient rehab admission.  I also contacted her son, Dr. Meryl Crutch to discuss my assessment., Pt is mobilizing well with therapy as I observed her in hallway with PTA. Cognitive and speech deficits remain. I will discuss with Dr. Letta Pate and follow up tomorrow with our recommendations for rehab venue.  Danne Baxter, RN, MSN Rehab Admissions Coordinator [phone] 05/11/2019 2:14 PM

## 2019-05-11 NOTE — Progress Notes (Signed)
Speech Language Pathology Treatment: Cognitive-Linquistic(Aphasia)  Patient Details Name: Cathy Harrington MRN: [id number] DOB: [birth year] Today's Date: 05/11/2019 Time: 0175-1025 SLP Time Calculation (min) (ACUTE ONLY): 34 min  Assessment / Plan / Recommendation Clinical Impression  Pt's husband and son were contacted via phone and were educated regarding the pt's progress during the session compared to that which was demonstrated on 05/09/19. Both parties expressed their desire and hope for the pt to go to CIR and the pt's husband stated that his second preference would be for [facility] outpatient therapy center if this is possible. Pt's husband and son stated that they were grateful for the call and both verbalized understanding regarding all areas of education.  The [employer] interpreter system was taken to the room in anticipation of the pt communicating in Arabic/Armenian but the entire session was conducted in [place] and she was noted to only produce sentences in English during the session. She independently produced social phrases such as "Good bye" and "how are you?". She participated in conversation with the SLP but requests for clarification were needed due to the telegraphic nature of some sentences. At the onset of the session she stated, "I came home Sunday at night I was fine all day then it happened." When asked what happened, she stated, "All of a sudden half an hour it started it happened in half an hour or less than hour [place] dizzy". When this was relayed to the pt's son, he indicated that the pt's intent was likely to communicate her symptoms just prior to admission. Pt was also able to list items which she asked her husband to bring to the hospital by stating, "Magazine, walker, underwear to bring I told him to bring...my husband will bring in". It is also noteworthy, that the pt was able to communicate to the SLP that her husband was her neighbor in [place] when they were  children, that their parents were friends, that he was 66 years younger than she, and that he was a professor in [place]. Upon verification with the pt's son, it was clarified that the pt's husband is actually 65 years older than she and that he was a professor at [employer] but all other information provided was accurate. She achieved 33% accuracy with picture identification from a field of four and 60% accuracy with identification from a field of two increasing to 80% accuracy with semantic cues. She responded to complex yes/no questions with mod cues and re-phrasing. When encouraged regarding her progress the pt stated, "I'm improving a little bit." SLP will continue to follow pt.    HPI HPI: This is a 73 year old woman who was at her baseline until she became unable to speak and she appeared confused. Apparently a container with rice fell on her head and she had no overt signs of trauma. She was described as having a right facial droop and right-sided weakness when she came in. 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 She has had continuing difficulties with finding words and naming objects. She has had seizures before. MD observed seizure in room with pt and she was loaded with Keppra.       SLP Plan  Continue with current plan of care       Recommendations                   Follow up Recommendations: Inpatient Rehab SLP Visit Diagnosis: Aphasia (R47.01) Plan: Continue with current plan of  care       [place] I. Hardin Negus, [place], [place] Office number [phone] Pager [place] 05/11/2019, 3:50 PM

## 2019-05-11 NOTE — Progress Notes (Signed)
PROGRESS NOTE    Cathy Harrington  QHU:[id number] DOB: Mar 19, 1946 DOA: 05/08/2019 PCP: System, Pcp Not In   Brief Narrative: Cathy Harrington is a 73 y.o. femalewith medical history significant ofT2DM, hypothyroidism, and suspected viral encephalitis in 2017 though CSF was not tested for HSV then, seizure during hospitalization for encephalitis in 2017. Patient presented secondary to possible traumatic brain injury and subsequent development of aphasia with associated right facial droop and facial twitching. Concern for partial seizure and patient started on AEDs; confirmed on LTM.   Assessment & Plan:   Principal Problem:   Aphasia Active Problems:   Seizure ([place])   Encephalitis   Diabetes mellitus type 2 in obese ([place])   Hypothyroidism (acquired)   Aphasia Neurology on board. Leading diagnosis is sequela of seizure disorder. Prior history of HSV encephalitis affecting temporal lobes. Patient started on Keppra, followed by Depakote load/Depakote dosing secondary to continued discharges seen on LTM. Neurology now recommending to increase Depakote and have started Ativan scheduled to see if there is faster improvement of presentation. -Discontinue IV fluids; BMP today -PT recommending CIR  Seizure disorder Secondary to history of HSV encephalitis. Patient is not on medication as an outpatient. Medications as mentioned above  Diabetes mellitus, type 2 Hemoglobin A1C of 7.2%. Patient is on glipizide and metformin as an outpatient. -Continue SSI  Hypothyroidism TSH of 1.2 -Continue Synthroid  Hyperlipidemia Patient is on lovastatin as an outpatient -Continue pravastatin (formulary substitution)    DVT prophylaxis: Lovenox Code Status:   Code Status: Full Code Family Communication: None Disposition Plan: Discharge possibly to CIR once neurology has signed off   Consultants:   Neurology  Procedures:   LTM  Antimicrobials:  None    Subjective: No  concerns  Objective: Vitals:   05/10/19 1600 05/10/19 1941 05/10/19 2355 05/11/19 0410  BP: 138/74 (!) 155/81 (!) 143/72 135/73  Pulse: 85 83 84 79  Resp: 18 15 17 15   Temp: 98.3 F (36.8 C) 98 F (36.7 C) 98.4 F (36.9 C) 98.2 F (36.8 C)  TempSrc: Oral Oral Oral Oral  SpO2: 100% 98% 97% 98%    Intake/Output Summary (Last 24 hours) at 05/11/2019 0747 Last data filed at 05/10/2019 1500 Gross per 24 hour  Intake 2253.17 ml  Output --  Net 2253.17 ml   There were no vitals filed for this visit.  Examination:  General exam: Appears calm and comfortable Respiratory system: Clear to auscultation. Respiratory effort normal. Cardiovascular system: S1 & S2 heard, RRR. No murmurs, rubs, gallops or clicks. Gastrointestinal system: Abdomen is nondistended, soft and nontender. No organomegaly or masses felt. Normal bowel sounds heard. Central nervous system: Alert and oriented to self. Repeats "[place]" for most questions but answers somewhat appropriately for other questions. Extremities: No edema. No calf tenderness Skin: No cyanosis. No rashes Psychiatry: Judgement and insight appear impaired. Mood & affect appropriate.     Data Reviewed: I have personally reviewed following labs and imaging studies  CBC: Recent Labs  Lab 05/07/19 1642  WBC 6.7  NEUTROABS 3.6  HGB 13.1  HCT 40.7  MCV 87.3  PLT 465   Basic Metabolic Panel: Recent Labs  Lab 05/07/19 1642  NA 142  K 4.1  CL 111  CO2 21*  GLUCOSE 110*  BUN 15  CREATININE 0.76  CALCIUM 9.5   GFR: CrCl cannot be calculated (Unknown ideal weight.). Liver Function Tests: Recent Labs  Lab 05/07/19 1642  AST 42*  ALT 60*  ALKPHOS 61  BILITOT 0.5  PROT 7.8  ALBUMIN 4.7   No results for input(s): LIPASE, AMYLASE in the last 168 hours. No results for input(s): AMMONIA in the last 168 hours. Coagulation Profile: Recent Labs  Lab 05/07/19 1642  INR 0.9   Cardiac Enzymes: No results for input(s): CKTOTAL,  CKMB, CKMBINDEX, TROPONINI in the last 168 hours. BNP (last 3 results) No results for input(s): PROBNP in the last 8760 hours. HbA1C: Recent Labs    05/09/19 0724  HGBA1C 7.2*   CBG: Recent Labs  Lab 05/10/19 0748 05/10/19 1153 05/10/19 1704 05/10/19 2123 05/11/19 0605  GLUCAP 127* 198* 140* 183* 121*   Lipid Profile: Recent Labs    05/09/19 0724  CHOL 145  HDL 44  LDLCALC 81  TRIG 101  CHOLHDL 3.3   Thyroid Function Tests: Recent Labs    05/08/19 1505  TSH 1.276  FREET4 1.01   Anemia Panel: No results for input(s): VITAMINB12, FOLATE, FERRITIN, TIBC, IRON, RETICCTPCT in the last 72 hours. Sepsis Labs: No results for input(s): PROCALCITON, LATICACIDVEN in the last 168 hours.  Recent Results (from the past 240 hour(s))  Novel Coronavirus,NAA,(SEND-OUT TO REF LAB - TAT 24-48 hrs); Hosp Order     Status: None   Collection Time: 05/07/19  7:34 PM   Specimen: Nasopharyngeal Swab; Respiratory  Result Value Ref Range Status   SARS-CoV-2, NAA NOT DETECTED NOT DETECTED Final    Comment: (NOTE) This test was developed and its performance characteristics determined by [employer]. This test has not been FDA cleared or approved. This test has been authorized by FDA under an Emergency Use Authorization (EUA). This test is only authorized for the duration of time the declaration that circumstances exist justifying the authorization of the emergency use of in vitro diagnostic tests for detection of SARS-CoV-2 virus and/or diagnosis of COVID-19 infection under section 564(b)(1) of the Act, 21 U.S.C. 854OEV-0(J)(5), unless the authorization is terminated or revoked sooner. When diagnostic testing is negative, the possibility of a false negative result should be considered in the context of a patient's recent exposures and the presence of clinical signs and symptoms consistent with COVID-19. An individual without symptoms of COVID-19 and who is not shedding  SARS-CoV-2 virus would expect to have a negative (not detected) result in this assay. Performed  At: [facility] [address] [place], [place] [id number] Rush Farmer MD HB:[id number]    [place]  Final    Comment: Performed at [facility], [place] [place],  [address]  SARS Coronavirus 2 (CEPHEID - Performed in [facility] hospital lab), Hosp Order     Status: None   Collection Time: 05/08/19 11:14 AM   Specimen: Nasopharyngeal Swab  Result Value Ref Range Status   SARS Coronavirus 2 NEGATIVE NEGATIVE Final    Comment: (NOTE) If result is NEGATIVE SARS-CoV-2 target nucleic acids are NOT DETECTED. The SARS-CoV-2 RNA is generally detectable in upper and lower  respiratory specimens during the acute phase of infection. The lowest  concentration of SARS-CoV-2 viral copies this assay can detect is 250  copies / mL. A negative result does not preclude SARS-CoV-2 infection  and should not be used as the sole basis for treatment or other  patient management decisions.  A negative result may occur with  improper specimen collection / handling, submission of specimen other  than nasopharyngeal swab, presence of viral mutation(s) within the  areas targeted by this assay, and inadequate number of viral copies  (<250 copies / mL). A  negative result must be combined with clinical  observations, patient history, and epidemiological information. If result is POSITIVE SARS-CoV-2 target nucleic acids are DETECTED. The SARS-CoV-2 RNA is generally detectable in upper and lower  respiratory specimens dur ing the acute phase of infection.  Positive  results are indicative of active infection with SARS-CoV-2.  Clinical  correlation with patient history and other diagnostic information is  necessary to determine patient infection status.  Positive results do  not rule out bacterial infection or co-infection with other viruses. If result is  PRESUMPTIVE POSTIVE SARS-CoV-2 nucleic acids MAY BE PRESENT.   A presumptive positive result was obtained on the submitted specimen  and confirmed on repeat testing.  While 2019 novel coronavirus  (SARS-CoV-2) nucleic acids may be present in the submitted sample  additional confirmatory testing may be necessary for epidemiological  and / or clinical management purposes  to differentiate between  SARS-CoV-2 and other Sarbecovirus currently known to infect humans.  If clinically indicated additional testing with an alternate test  methodology [phone]) is advised. The SARS-CoV-2 RNA is generally  detectable in upper and lower respiratory sp ecimens during the acute  phase of infection. The expected result is Negative. Fact Sheet for Patients:  StrictlyIdeas.no Fact Sheet for Healthcare Providers: BankingDealers.co.za This test is not yet approved or cleared by the [place] FDA and has been authorized for detection and/or diagnosis of SARS-CoV-2 by FDA under an Emergency Use Authorization (EUA).  This EUA will remain in effect (meaning this test can be used) for the duration of the COVID-19 declaration under Section 564(b)(1) of the Act, 21 U.S.C. section 360bbb-3(b)(1), unless the authorization is terminated or revoked sooner. Performed at [facility], [place] [address] [place], [place] [address]          Radiology Studies: Dg Chest 2 View  Result Date: 05/10/2019 CLINICAL DATA:  MRI clearance. Confusion without chest pain. History of stroke. EXAM: CHEST - 2 VIEW COMPARISON:  None. FINDINGS: The lung volumes are somewhat low. There may be a left-sided pleural effusion with adjacent atelectasis. There is no pneumothorax. There is no unexpected metallic foreign body. There are mild degenerative changes throughout the thoracic spine. IMPRESSION: 1. Low lung volumes with probable atelectasis at the left lung base. 2. No unexpected  metallic foreign body. Electronically Signed   By: Constance Holster M.D.   On: 05/10/2019 21:45   Mr Brain Wo Contrast  Result Date: 05/10/2019 CLINICAL DATA:  Initial evaluation for acute aphasia, encephalopathy. EXAM: MRI HEAD WITHOUT CONTRAST TECHNIQUE: Multiplanar, multiecho pulse sequences of the brain and surrounding structures were obtained without intravenous contrast. COMPARISON:  Prior CT from 05/07/2019. FINDINGS: Brain: Cerebral volume within normal limits for age. Chronic encephalomalacia and gliosis involving the anterior left insula and anterior left temporal lobe, stable. Focal encephalomalacia involving the anterior left cingulate also unchanged. No abnormal foci of restricted diffusion to suggest acute or subacute ischemia. Gray-white matter differentiation maintained. No other areas of chronic cortical infarction. No evidence for acute intracranial hemorrhage. No mass lesion, midline shift or mass effect. Ex vacuo dilatation of the temporal horn of the left lateral ventricle related to the left temporal encephalomalacia. No hydrocephalus. No extra-axial fluid collection. Pituitary gland suprasellar region normal. Midline structures intact. Vascular: Major intracranial vascular flow voids are well maintained. Skull and upper cervical spine: Craniocervical junction within normal limits. Upper cervical spine normal. Bone marrow signal intensity within normal limits. No scalp soft tissue abnormality. Sinuses/Orbits: Globes and orbital soft tissues within  normal limits. Left maxillary sinus retention cyst noted. Paranasal sinuses are otherwise largely clear. No significant mastoid effusion. Inner ear structures grossly normal. Other: None. IMPRESSION: 1. No acute intracranial abnormality. 2. 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 3. Otherwise unremarkable brain MRI for age. Electronically Signed   By: Jeannine Boga  M.D.   On: 05/10/2019 22:32        Scheduled Meds:  aspirin EC  81 mg Oral Daily   enoxaparin (LOVENOX) injection  40 mg Subcutaneous Q24H   insulin aspart  0-15 Units Subcutaneous TID WC   insulin aspart  0-5 Units Subcutaneous QHS   levothyroxine  100 mcg Oral Q0600   pravastatin  10 mg Oral q1800   Continuous Infusions:  sodium chloride 75 mL/hr (05/08/19 1353)   levETIRAcetam 1,000 mg (05/11/19 0104)   valproate sodium 500 mg (05/10/19 2319)     LOS: 2 days     Cordelia Poche, MD [facility] 05/11/2019, 7:47 AM  If 7PM-7AM, please contact night-coverage [email or web address]

## 2019-05-12 ENCOUNTER — Encounter (HOSPITAL_COMMUNITY): Payer: Self-pay

## 2019-05-12 ENCOUNTER — Inpatient Hospital Stay (HOSPITAL_COMMUNITY)
Admission: RE | Admit: 2019-05-12 | Discharge: 2019-05-19 | DRG: 945 | Disposition: A | Discharge status: Home / Self Care | Payer: Medicare Other | Source: Intra-hospital | Attending: Physical Medicine & Rehabilitation | Admitting: Physical Medicine & Rehabilitation

## 2019-05-12 DIAGNOSIS — Z833 Family history of diabetes mellitus: Secondary | ICD-10-CM

## 2019-05-12 DIAGNOSIS — M545 Low back pain: Secondary | ICD-10-CM | POA: Diagnosis present

## 2019-05-12 DIAGNOSIS — G9389 Other specified disorders of brain: Secondary | ICD-10-CM | POA: Diagnosis present

## 2019-05-12 DIAGNOSIS — R740 Nonspecific elevation of levels of transaminase and lactic acid dehydrogenase [LDH]: Secondary | ICD-10-CM | POA: Diagnosis present

## 2019-05-12 DIAGNOSIS — Z87891 Personal history of nicotine dependence: Secondary | ICD-10-CM | POA: Diagnosis not present

## 2019-05-12 DIAGNOSIS — R748 Abnormal levels of other serum enzymes: Secondary | ICD-10-CM | POA: Diagnosis present

## 2019-05-12 DIAGNOSIS — R4701 Aphasia: Secondary | ICD-10-CM | POA: Diagnosis present

## 2019-05-12 DIAGNOSIS — R413 Other amnesia: Secondary | ICD-10-CM | POA: Diagnosis present

## 2019-05-12 DIAGNOSIS — S069X1S Unspecified intracranial injury with loss of consciousness of 30 minutes or less, sequela: Secondary | ICD-10-CM | POA: Diagnosis not present

## 2019-05-12 DIAGNOSIS — S06890D Other specified intracranial injury without loss of consciousness, subsequent encounter: Secondary | ICD-10-CM | POA: Diagnosis present

## 2019-05-12 DIAGNOSIS — G8929 Other chronic pain: Secondary | ICD-10-CM | POA: Diagnosis present

## 2019-05-12 DIAGNOSIS — R569 Unspecified convulsions: Secondary | ICD-10-CM | POA: Diagnosis present

## 2019-05-12 DIAGNOSIS — M549 Dorsalgia, unspecified: Secondary | ICD-10-CM | POA: Diagnosis present

## 2019-05-12 DIAGNOSIS — S069X0A Unspecified intracranial injury without loss of consciousness, initial encounter: Secondary | ICD-10-CM | POA: Diagnosis not present

## 2019-05-12 DIAGNOSIS — G40901 Epilepsy, unspecified, not intractable, with status epilepticus: Secondary | ICD-10-CM | POA: Diagnosis present

## 2019-05-12 DIAGNOSIS — R269 Unspecified abnormalities of gait and mobility: Secondary | ICD-10-CM | POA: Diagnosis present

## 2019-05-12 DIAGNOSIS — Z79899 Other long term (current) drug therapy: Secondary | ICD-10-CM

## 2019-05-12 DIAGNOSIS — Z8661 Personal history of infections of the central nervous system: Secondary | ICD-10-CM

## 2019-05-12 DIAGNOSIS — Z7982 Long term (current) use of aspirin: Secondary | ICD-10-CM

## 2019-05-12 DIAGNOSIS — S069XAA Unspecified intracranial injury with loss of consciousness status unknown, initial encounter: Secondary | ICD-10-CM | POA: Diagnosis present

## 2019-05-12 DIAGNOSIS — W208XXD Other cause of strike by thrown, projected or falling object, subsequent encounter: Secondary | ICD-10-CM | POA: Diagnosis present

## 2019-05-12 DIAGNOSIS — F419 Anxiety disorder, unspecified: Secondary | ICD-10-CM | POA: Diagnosis present

## 2019-05-12 DIAGNOSIS — S069X0S Unspecified intracranial injury without loss of consciousness, sequela: Secondary | ICD-10-CM | POA: Diagnosis not present

## 2019-05-12 DIAGNOSIS — E039 Hypothyroidism, unspecified: Secondary | ICD-10-CM | POA: Diagnosis present

## 2019-05-12 DIAGNOSIS — Z7984 Long term (current) use of oral hypoglycemic drugs: Secondary | ICD-10-CM

## 2019-05-12 DIAGNOSIS — G40909 Epilepsy, unspecified, not intractable, without status epilepticus: Secondary | ICD-10-CM

## 2019-05-12 DIAGNOSIS — E119 Type 2 diabetes mellitus without complications: Secondary | ICD-10-CM | POA: Diagnosis present

## 2019-05-12 DIAGNOSIS — Z887 Allergy status to serum and vaccine status: Secondary | ICD-10-CM

## 2019-05-12 DIAGNOSIS — R4189 Other symptoms and signs involving cognitive functions and awareness: Secondary | ICD-10-CM | POA: Diagnosis present

## 2019-05-12 DIAGNOSIS — R7401 Elevation of levels of liver transaminase levels: Secondary | ICD-10-CM

## 2019-05-12 DIAGNOSIS — S069X9A Unspecified intracranial injury with loss of consciousness of unspecified duration, initial encounter: Secondary | ICD-10-CM | POA: Diagnosis present

## 2019-05-12 DIAGNOSIS — S069X2S Unspecified intracranial injury with loss of consciousness of 31 minutes to 59 minutes, sequela: Secondary | ICD-10-CM | POA: Diagnosis not present

## 2019-05-12 LAB — GLUCOSE, CAPILLARY
Glucose-Capillary: 136 mg/dL — ABNORMAL HIGH (ref 70–99)
Glucose-Capillary: 147 mg/dL — ABNORMAL HIGH (ref 70–99)
Glucose-Capillary: 159 mg/dL — ABNORMAL HIGH (ref 70–99)
Glucose-Capillary: 181 mg/dL — ABNORMAL HIGH (ref 70–99)

## 2019-05-12 MED ORDER — PROCHLORPERAZINE 25 MG RE SUPP
12.5000 mg | Freq: Four times a day (QID) | RECTAL | Status: DC | PRN
  Filled 2019-05-12: qty 1

## 2019-05-12 MED ORDER — ACETAMINOPHEN 325 MG PO TABS: 325.0000 mg | ORAL_TABLET | ORAL | Status: DC | PRN

## 2019-05-12 MED ORDER — GUAIFENESIN-DM 100-10 MG/5ML PO SYRP: 5.0000 mL | ORAL_SOLUTION | Freq: Four times a day (QID) | ORAL | Status: DC | PRN

## 2019-05-12 MED ORDER — LORAZEPAM 1 MG PO TABS: 1.0000 mg | ORAL_TABLET | Freq: Three times a day (TID) | ORAL | 0 refills | Status: DC

## 2019-05-12 MED ORDER — POLYETHYLENE GLYCOL 3350 17 G PO PACK
17.0000 g | PACK | Freq: Every day | ORAL | Status: DC | PRN
  Administered 2019-05-13 – 2019-05-18 (×2): 17 g via ORAL
  Filled 2019-05-12 (×2): qty 1

## 2019-05-12 MED ORDER — DIVALPROEX SODIUM 250 MG PO DR TAB: 750.0000 mg | DELAYED_RELEASE_TABLET | Freq: Three times a day (TID) | ORAL | Status: DC

## 2019-05-12 MED ORDER — DIPHENHYDRAMINE HCL 12.5 MG/5ML PO ELIX: 12.5000 mg | ORAL_SOLUTION | Freq: Four times a day (QID) | ORAL | Status: DC | PRN

## 2019-05-12 MED ORDER — LEVOTHYROXINE SODIUM 100 MCG PO TABS
100.0000 ug | ORAL_TABLET | Freq: Every day | ORAL | Status: DC
  Administered 2019-05-13 – 2019-05-19 (×7): 100 ug via ORAL
  Filled 2019-05-12 (×7): qty 1

## 2019-05-12 MED ORDER — ASPIRIN EC 81 MG PO TBEC
81.0000 mg | DELAYED_RELEASE_TABLET | Freq: Every day | ORAL | Status: DC
  Administered 2019-05-13 – 2019-05-19 (×7): 81 mg via ORAL
  Filled 2019-05-12 (×7): qty 1

## 2019-05-12 MED ORDER — SENNOSIDES-DOCUSATE SODIUM 8.6-50 MG PO TABS
1.0000 | ORAL_TABLET | Freq: Every evening | ORAL | Status: DC | PRN
  Administered 2019-05-18: 1 via ORAL
  Filled 2019-05-12: qty 1

## 2019-05-12 MED ORDER — BISACODYL 10 MG RE SUPP
10.0000 mg | Freq: Every day | RECTAL | Status: DC | PRN
  Administered 2019-05-19: 10 mg via RECTAL
  Filled 2019-05-12: qty 1

## 2019-05-12 MED ORDER — TRAZODONE HCL 50 MG PO TABS: 25.0000 mg | ORAL_TABLET | Freq: Every evening | ORAL | Status: DC | PRN

## 2019-05-12 MED ORDER — LEVETIRACETAM 500 MG PO TABS
1000.0000 mg | ORAL_TABLET | Freq: Two times a day (BID) | ORAL | Status: DC
  Administered 2019-05-12 – 2019-05-16 (×8): 1000 mg via ORAL
  Filled 2019-05-12 (×8): qty 2

## 2019-05-12 MED ORDER — PRAVASTATIN SODIUM 10 MG PO TABS
10.0000 mg | ORAL_TABLET | Freq: Every day | ORAL | Status: DC
  Administered 2019-05-12 – 2019-05-18 (×7): 10 mg via ORAL
  Filled 2019-05-12 (×7): qty 1

## 2019-05-12 MED ORDER — LEVETIRACETAM 1000 MG PO TABS: 1000.0000 mg | ORAL_TABLET | Freq: Two times a day (BID) | ORAL | Status: DC

## 2019-05-12 MED ORDER — VALPROIC ACID 250 MG PO CAPS: 750.0000 mg | ORAL_CAPSULE | Freq: Two times a day (BID) | ORAL | Status: DC

## 2019-05-12 MED ORDER — INSULIN ASPART 100 UNIT/ML ~~LOC~~ SOLN
0.0000 [IU] | Freq: Every day | SUBCUTANEOUS | Status: DC
  Administered 2019-05-13 – 2019-05-14 (×2): 2 [IU] via SUBCUTANEOUS

## 2019-05-12 MED ORDER — LORAZEPAM 2 MG/ML IJ SOLN: 1.0000 mg | INTRAMUSCULAR | Status: DC | PRN

## 2019-05-12 MED ORDER — FLEET ENEMA 7-19 GM/118ML RE ENEM: 1.0000 | ENEMA | Freq: Once | RECTAL | Status: DC | PRN

## 2019-05-12 MED ORDER — METFORMIN HCL 500 MG PO TABS
500.0000 mg | ORAL_TABLET | Freq: Two times a day (BID) | ORAL | Status: DC
  Administered 2019-05-12 – 2019-05-19 (×14): 500 mg via ORAL
  Filled 2019-05-12 (×14): qty 1

## 2019-05-12 MED ORDER — ALUM & MAG HYDROXIDE-SIMETH 200-200-20 MG/5ML PO SUSP: 30.0000 mL | ORAL | Status: DC | PRN

## 2019-05-12 MED ORDER — ENOXAPARIN SODIUM 40 MG/0.4ML ~~LOC~~ SOLN
40.0000 mg | SUBCUTANEOUS | Status: DC
  Administered 2019-05-13 – 2019-05-18 (×6): 40 mg via SUBCUTANEOUS
  Filled 2019-05-12 (×6): qty 0.4

## 2019-05-12 MED ORDER — PROCHLORPERAZINE MALEATE 5 MG PO TABS: 5.0000 mg | ORAL_TABLET | Freq: Four times a day (QID) | ORAL | Status: DC | PRN

## 2019-05-12 MED ORDER — LORAZEPAM 1 MG PO TABS
1.0000 mg | ORAL_TABLET | Freq: Three times a day (TID) | ORAL | Status: AC
  Administered 2019-05-12: 1 mg via ORAL
  Filled 2019-05-12: qty 1

## 2019-05-12 MED ORDER — DIVALPROEX SODIUM 250 MG PO DR TAB: 750.0000 mg | DELAYED_RELEASE_TABLET | Freq: Two times a day (BID) | ORAL | Status: DC

## 2019-05-12 MED ORDER — INSULIN ASPART 100 UNIT/ML ~~LOC~~ SOLN
0.0000 [IU] | Freq: Three times a day (TID) | SUBCUTANEOUS | Status: DC
  Administered 2019-05-12: 3 [IU] via SUBCUTANEOUS
  Administered 2019-05-13 (×2): 2 [IU] via SUBCUTANEOUS
  Administered 2019-05-14: 3 [IU] via SUBCUTANEOUS
  Administered 2019-05-14: 2 [IU] via SUBCUTANEOUS
  Administered 2019-05-15 – 2019-05-16 (×2): 3 [IU] via SUBCUTANEOUS
  Administered 2019-05-17: 2 [IU] via SUBCUTANEOUS
  Administered 2019-05-18: 3 [IU] via SUBCUTANEOUS
  Administered 2019-05-18 – 2019-05-19 (×2): 2 [IU] via SUBCUTANEOUS

## 2019-05-12 MED ORDER — PROCHLORPERAZINE EDISYLATE 10 MG/2ML IJ SOLN: 5.0000 mg | Freq: Four times a day (QID) | INTRAMUSCULAR | Status: DC | PRN

## 2019-05-12 MED ORDER — VALPROIC ACID 250 MG PO CAPS
750.0000 mg | ORAL_CAPSULE | Freq: Two times a day (BID) | ORAL | Status: DC
  Administered 2019-05-12 – 2019-05-17 (×10): 750 mg via ORAL
  Filled 2019-05-12 (×10): qty 3

## 2019-05-12 MED ORDER — LIDOCAINE 5 % EX PTCH
1.0000 | MEDICATED_PATCH | CUTANEOUS | Status: DC
  Administered 2019-05-13 – 2019-05-19 (×7): 1 via TRANSDERMAL
  Filled 2019-05-12 (×7): qty 1

## 2019-05-12 NOTE — Progress Notes (Signed)
Occupational Therapy Treatment Patient Details Name: Cathy Harrington MRN: [id number] DOB: Oct 24, 1946 Today's Date: 05/12/2019    History of present illness Pt is a 73yo female admitted with aphasia. PMH: DM2, hypothyroidism, HSV encephalitis in 2017,Pt admitted to [facility] s/p possible TBI from a jar falling on her head and then developing pahasa. Pt with Noted R facial droop and twictchin of R face. Pt now on continuous EEG with noted abnomalities in L hemisphere.   OT comments  Upon entering the room, pt supine in bed with husband, Yvetta Coder, present in room to assist with interpretation. Pt only needing min cuing for routine tasks in room such as washing hands, grooming, toileting needs. Activities requiring increased challenge and problem solving with require more cuing. Pt overall min guard without use of AD this session as well. Pt making progress towards goals and continues to benefit from OT intervention.  Follow Up Recommendations  CIR    Equipment Recommendations  None recommended by OT    Recommendations for Other Services      Precautions / Restrictions Precautions Precautions: Fall Precaution Comments: has reverted back to Arabic       Mobility Bed Mobility Overal bed mobility: Needs Assistance Bed Mobility: Supine to Sit     Supine to sit: Supervision        Transfers Overall transfer level: Needs assistance Equipment used: None Transfers: Sit to/from Stand Sit to Stand: Min guard Stand pivot transfers: Min guard       General transfer comment: min guard for safety    Balance Overall balance assessment: Needs assistance Sitting-balance support: Feet supported;No upper extremity supported Sitting balance-Leahy Scale: Good     Standing balance support: No upper extremity supported Standing balance-Leahy Scale: Fair Standing balance comment: min guard for balance without use of AD                           ADL either performed or assessed with  clinical judgement   ADL       Grooming: Wash/dry face;Wash/dry hands;Oral care;Standing;Supervision/safety Grooming Details (indicate cue type and reason): pt required min cuing/gestures to initiate                 Toilet Transfer: Min guard;Ambulation   Toileting- Clothing Manipulation and Hygiene: Min guard;Sit to/from stand         General ADL Comments: min guard overall for standing balance and transfer without use of AD. When asked if needed to use bathroom pt responds with , " Better go before accident".               Cognition Arousal/Alertness: Awake/alert Behavior During Therapy: WFL for tasks assessed/performed Overall Cognitive Status: History of cognitive impairments - at baseline(memory only per husband in room) Area of Impairment: Safety/judgement;Problem solving;Memory;Following commands          Memory: Decreased short-term memory Following Commands: Follows one step commands inconsistently Safety/Judgement: Decreased awareness of deficits;Decreased awareness of safety   Problem Solving: Slow processing;Decreased initiation;Difficulty sequencing;Requires verbal cues General Comments: per husband, pt still has some confusion when speaking in Arabic but it is getting better. When given a choice of two she is able to answer correctly 75% of the time. She appears to do much better with novel/routine tasks.                   Pertinent Vitals/ Pain       Pain Assessment: Faces Faces Pain Scale:  No hurt  Home Living   Living Arrangements: Spouse/significant other(adult daughter/ Autistic ( FIna)) Available Help at Discharge: Family;Available 24 hours/day(spouse, Yvetta Coder, will be caregiver) Type of Home: House       Home Layout: Two level;Laundry or work area in basement;Able to live on main level with bedroom/bathroom(full basement with IT sales professional) Alternate Level Stairs-Number of Steps: flight 21 steps down to full basement  wiht laundry and Scientist, research (medical) Accessibility: Yes How Accessible: Accessible via walker        Lives With: Spouse;Daughter        Frequency  Min 2X/week        Progress Toward Goals  OT Goals(current goals can now be found in the care plan section)  Progress towards OT goals: Progressing toward goals  Acute Rehab OT Goals Patient Stated Goal: to go to rehab OT Goal Formulation: With family Time For Goal Achievement: 05/23/19 Potential to Achieve Goals: Good  Plan Discharge plan remains appropriate       AM-PAC OT "6 Clicks" Daily Activity     Outcome Measure   Help from another person eating meals?: A Little Help from another person taking care of personal grooming?: A Little Help from another person toileting, which includes using toliet, bedpan, or urinal?: A Little Help from another person bathing (including washing, rinsing, drying)?: A Little Help from another person to put on and taking off regular upper body clothing?: A Little Help from another person to put on and taking off regular lower body clothing?: A Little 6 Click Score: 18    End of Session    OT Visit Diagnosis: Apraxia (R48.2);Cognitive communication deficit (R41.841)   Activity Tolerance Patient tolerated treatment well   Patient Left in bed;with call bell/phone within reach;with family/visitor present   Nurse Communication Mobility status        Time: 8341-9622 OT Time Calculation (min): 25 min  Charges: OT General Charges $OT Visit: 1 Visit OT Treatments $Self Care/Home Management : 23-37 mins   BradsherLatanya Presser, MS, OTR/L 05/12/2019, 12:37 PM

## 2019-05-12 NOTE — TOC Transition Note (Signed)
Transition of Care [facility]) - CM/SW Discharge Note   Patient Details  Name: Cathy Harrington MRN: [id number] Date of Birth: November 08, 1945  [facility] of [facility]) CM/SW Contact:  Pollie Friar, RN Phone Number: 05/12/2019, 12:27 PM   Clinical Narrative:    Pt is discharging to CIR today. CM signing off.   Final next level of care: IP Rehab Facility Barriers to Discharge: No Barriers Identified   Patient Goals and CMS Choice        Discharge Placement                       Discharge Plan and Services                                     Social Determinants of Health (SDOH) Interventions     Readmission Risk Interventions No flowsheet data found.

## 2019-05-12 NOTE — Progress Notes (Signed)
°Kirsteins, Andrew E, MD  °Physician  °Physical Medicine and Rehabilitation  °PMR Pre-admission  °Signed  °Date of Service:  05/12/2019 10:53 AM  °  °  °Related encounter: Admission (Current) from 05/08/2019 in [place] [facility]  °  °  °Signed    °  °  ° °Show:Clear all °[x]Manual[x]Template[]Copied ° °Added by: °[x]Boyette, Barbara G, RN[x]Kirsteins, Andrew E, MD ° °[]Hover for details °PMR Admission Coordinator Pre-Admission Assessment °  °Patient: Cathy Harrington is an 72 y.o., female °MRN: [id number] °DOB: 06/24/1946 °Height:   5'5" °Weight:   172 lbs °  °Insurance Information °HMO:     PPO:      PCP:      IPA:      80/20:      OTHER: no HMO °PRIMARY: Medicare a and b      Policy#: [id number]      Subscriber: pt °Benefits:  Phone #: passport one online     Name: 7/9 °Eff. Date: 09/04/2011     Deduct: $1408      Out of Pocket Max: none      Life Max: none °CIR: 100%      SNF: 20 full days °Outpatient: 80%     Co-Pay: 20% °Home Health: 100%      Co-Pay: none °DME: 80%     Co-Pay: 20% °Providers: pt choice ° °SECONDARY: AARP supplement      Policy#: [id number]      Subscriber: pt °  °Medicaid Application Date:       Case Manager:  °Disability Application Date:       Case Worker:  °  °The “Data Collection Information Summary” for patients in Inpatient Rehabilitation Facilities with attached “Privacy Act Statement-Health Care Records” was provided and verbally reviewed with: Family °  °Emergency Contact Information °        °Contact Information   °  Name Relation Home Work Mobile  °  Novakovich,RUDOLF T Spouse [phone]   [phone]  °  Stambaugh, Theodore Son     [phone]  °   ° °  °Current Medical History  °Patient Admitting Diagnosis: TBI °  °History of Present Illness: 72 year old female with history of DM type 2, hypothyroidism, and 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 Present to [facility] on 05/08/2019 following a possible traumatic injury to her head where a jar of rice fell on her  head while cooking. New onset aphasia. Patient reported to be confused, aphasic right facial droop and right  Sided weakness. 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  °  °Patient with history of extensive region of encephalomalacia in her temporal and frontal region form prior HSV encephalitis in 2017. After that episode patient was completely mute for about a month but gradually recovered her speech over several months with residual dysnomia. She was on Keppra for seizures, but was taken off med about 1.5 years ago without seizure recurrence until this episode precipitating this current admission.  °  °Neurology felt leading diagnosis is sequela of seizure disorder. Patient began on Keppra, followed by Depakote load/Depakote dosing secondary to continued discharges seen on LTM. Neurology now recommending to increase Depakote and have started Ativan to see if faster improvement of presentation. Appears some improvement but still saying repetitive words/questions.  °  °  °Patient's medical record from [facility]  has been reviewed by the rehabilitation admission coordinator and physician. °  °Past Medical History  °    °Past Medical History:  °Diagnosis Date  °•   Diabetes mellitus without complication (HCC)    °• Hypothyroidism    °• Stroke (HCC)    °  2017 1 week [facility] and 2 weeks [facility] 6 months rehab  °• Thyroid disease    ° °  °Family History   °family history is not on file. °  °Prior Rehab/Hospitalizations °Has the patient had prior rehab or hospitalizations prior to admission? Yes °2017 inpatient acute Rehab at [facility] followed by outpatient therapy for a few months °Has the patient had major surgery during 100 days prior to admission? No             °  °Current Medications ° °Current Facility-Administered Medications:  °•  acetaminophen (TYLENOL) tablet 650 mg, 650 mg, Oral, Q4H PRN **OR**  acetaminophen (TYLENOL) solution 650 mg, 650 mg, Per Tube, Q4H PRN **OR** acetaminophen (TYLENOL) suppository 650 mg, 650 mg, Rectal, Q4H PRN, Yates, Jennifer, MD °•  aspirin EC tablet 81 mg, 81 mg, Oral, Daily, Yates, Jennifer, MD, 81 mg at 05/12/19 1126 °•  enoxaparin (LOVENOX) injection 40 mg, 40 mg, Subcutaneous, Q24H, Yates, Jennifer, MD, 40 mg at 05/11/19 1326 °•  insulin aspart (novoLOG) injection 0-15 Units, 0-15 Units, Subcutaneous, TID WC, Yates, Jennifer, MD, 2 Units at 05/12/19 0714 °•  insulin aspart (novoLOG) injection 0-5 Units, 0-5 Units, Subcutaneous, QHS, Yates, Jennifer, MD °•  levETIRAcetam (KEPPRA) tablet 1,000 mg, 1,000 mg, Oral, BID, [place]k, Yujing Z, RPH, 1,000 mg at 05/12/19 1127 °•  levothyroxine (SYNTHROID) tablet 100 mcg, 100 mcg, Oral, Q0600, Yates, Jennifer, MD, 100 mcg at 05/12/19 0512 °•  LORazepam (ATIVAN) injection 1-2 mg, 1-2 mg, Intravenous, Q2H PRN, Yates, Jennifer, MD °•  LORazepam (ATIVAN) tablet 1 mg, 1 mg, Oral, Q8H, Krienke, Marissa M, MD, 1 mg at 05/12/19 0512 °•  pravastatin (PRAVACHOL) tablet 10 mg, 10 mg, Oral, q1800, Yates, Jennifer, MD, 10 mg at 05/11/19 1719 °•  senna-docusate (Senokot-S) tablet 1 tablet, 1 tablet, Oral, QHS PRN, Yates, Jennifer, MD °•  valproic acid (DEPAKENE) 250 MG capsule 750 mg, 750 mg, Oral, BID, Krienke, Marissa M, MD, 750 mg at 05/12/19 1126 °  °Patients Current Diet:  °   °Diet Order   °       °        °    Diet heart healthy/carb modified Room service appropriate? No; Fluid consistency: Thin  Diet effective 1000    °   °   °  °   ° °  °Precautions / Restrictions °Precautions °Precautions: Fall °Precaution Comments: has reverted back to Arabic °Restrictions °Weight Bearing Restrictions: No  °  °Has the patient had 2 or more falls or a fall with injury in the past year? No °  °Prior Activity Level °Community (5-7x/wk): Independent without AD; driving, cooking, cleaning, etc °  °Prior Functional Level °Self Care: Did the patient need help  bathing, dressing, using the toilet or eating? Independent °  °Indoor Mobility: Did the patient need assistance with walking from room to room (with or without device)? Independent °  °Stairs: Did the patient need assistance with internal or external stairs (with or without device)? Independent °  °Functional Cognition: Did the patient need help planning regular tasks such as shopping or remembering to take medications? Independent °  °Home Assistive Devices / Equipment °Home Assistive Devices/Equipment: None °Home Equipment: None °  °Prior Device Use: Indicate devices/aids used by the patient prior to current illness, exacerbation or injury? None of the above °  °Current Functional Level °Cognition °  Arousal/Alertness: Awake/alert °Overall Cognitive Status: Difficult to assess °Difficult to assess due to: Impaired communication, Non-English speaking °Orientation Level: Oriented to person °Following Commands: Follows   Following Commands: Follows one step commands inconsistently Safety/Judgement: Decreased awareness of deficits, Decreased awareness of safety General Comments: per husband, pt still has some confusion when he is speaking to her in Arabic; pt speaking in English at times however not answering questions consistently (answering one way when therapists asks and a different answer when husband asks in Arabic) Attention: Focused, Sustained Focused Attention: Appears intact Sustained Attention: Appears intact Awareness: Impaired Awareness Impairment: Intellectual impairment, Emergent impairment Problem Solving: Impaired Problem Solving Impairment: Functional basic, Verbal basic    Extremity Assessment (includes Sensation/Coordination)  Upper Extremity Assessment: Generalized weakness  Lower Extremity Assessment: Defer to PT evaluation    ADLs  Overall ADL's : Needs assistance/impaired Eating/Feeding: Minimal assistance, Bed level Grooming: Wash/dry hands, Wash/dry face, Oral care, Brushing hair, Total assistance,  Bed level Grooming Details (indicate cue type and reason): Pt does not recognize and is not able to demonstrate use of comb or toothbrush.  When hand over hand assist provided, she was unable to brush her teeth, and procceded to try to eat the toothpaste  Upper Body Bathing: Total assistance, Bed level Lower Body Bathing: Total assistance, Bed level Upper Body Dressing : Total assistance, Bed level Lower Body Dressing: Total assistance, Bed level Toilet Transfer: Minimal assistance, Stand-pivot, BSC Toileting- Clothing Manipulation and Hygiene: Maximal assistance, Sit to/from stand Functional mobility during ADLs: Minimal assistance General ADL Comments: Pt limited by communication deficits as well as ideational apraxia.  LT EEG monitoring also makes it difficult to engage pt in full ADL     Mobility  Overal bed mobility: Needs Assistance Bed Mobility: Supine to Sit Supine to sit: Supervision General bed mobility comments: for safety; pt has no awarness of IV line     Transfers  Overall transfer level: Needs assistance Equipment used: (assist at trunk with gait belt ) Transfers: Sit to/from Stand Sit to Stand: Min guard Stand pivot transfers: Min assist General transfer comment: min guard for safety    Ambulation / Gait / Stairs / Wheelchair Mobility  Ambulation/Gait Ambulation/Gait assistance: Min assist, Counsellor (Feet): 160 Feet Assistive device: (assist at trunk with gait belt) Gait Pattern/deviations: Step-through pattern, Decreased stride length, Drifts right/left General Gait Details: narrow BOS and unsteady especially with challenges to balance; assist for balance  Gait velocity interpretation: <1.31 ft/sec, indicative of household ambulator    Posture / Balance Balance Overall balance assessment: Needs assistance Sitting-balance support: Feet supported, No upper extremity supported Sitting balance-Leahy Scale: Good Standing balance support: No  upper extremity supported Standing balance-Leahy Scale: Fair Standing balance comment: pt impulsive and reaching for chair, needs physical assist for safetly    Special needs/care consideration BiPAP/CPAP  N/a CPM  N/a Continuous Drip IV  N/a Dialysis  N/a Life Vest  N/a Oxygen  N/a Special Bed  High fall risk; low bed with floor pads Trach Size  N/a Wound Vac n/a Skin intact Bowel mgmt:  Continent LBM 7/6 Bladder mgmt: incontinent; external catheter Diabetic mgmt:  Hgb A1c 7.2 Behavioral consideration  High fall risk; impulsive and decreased safety awareness. Spouse Geoffery Spruce approved to visit daily. He is aware that this will be assessed daily to be able to continue by Nursing and Physician staff Chemo/radiation  N/a Native Arabic language. [place] nationality. Speaks Hebrew due to working in [employer] in [place]. Pta spoke fluent [place]. Ebbie Ridge I have asked spouse to be present 8 am until 3 pm first day of evals for interpret and then will be reevaled if he can  continue to be present   Previous Home Environment  Living Arrangements: Spouse/significant other(adult daughter/ Autistic Sales promotion account executive))  Lives With: Spouse, Daughter Available Help at Discharge: Family, Available 24 hours/day(spouse, Yvetta Coder, will be caregiver) Type of Home: House Home Layout: Two level, Laundry or work area in basement, Able to live on main level with bedroom/bathroom(full basement with laundry and pantry storage) Alternate Level Stairs-Rails: Right Alternate Level Stairs-Number of Steps: flight 21 steps down to full basement wiht laundry and pantry storage Home Access: Stairs to enter Entrance Stairs-Rails: Right Entrance Stairs-Number of Steps: 4 Bathroom Shower/Tub: Multimedia programmer: Standard Bathroom Accessibility: Yes How Accessible: Accessible via [place]: No  Discharge Living Setting Plans for Discharge Living Setting: Patient's home, Lives with  (comment)(spouse and adult autistic daughter, Roney Mans) Type of Home at Discharge: House Discharge Home Layout: Two level, Laundry or work area in basement, Able to live on main level with bedroom/bathroom(basement with IT sales professional) Alternate Level Stairs-Rails: Right Alternate Level Stairs-Number of Steps: 21 steps down to basement Discharge Home Access: Stairs to enter Entrance Stairs-Rails: Right Entrance Stairs-Number of Steps: 4 Discharge Bathroom Shower/Tub: Walk-in shower Discharge Bathroom Toilet: Standard Discharge Bathroom Accessibility: Yes How Accessible: Accessible via walker Does the patient have any problems obtaining your medications?: No  Social/Family/Support Systems Patient Roles: Spouse, Parent Contact Information: spouse, "Geoffery Spruce" Yvetta Coder Anticipated Caregiver: spouse Anticipated Caregiver's Contact Information: cell [phone] Ability/Limitations of Caregiver: no limitations Caregiver Availability: 24/7 Discharge Plan Discussed with Primary Caregiver: Yes Is Caregiver In Agreement with Plan?: Yes Does Caregiver/Family have Issues with Lodging/Transportation while Pt is in Rehab?: No(spouse has been coming daily for 4 to 6 hrs due to patient c) cognition and language issues  Goals/Additional Needs Patient/Family Goal for Rehab: superivsion PT, OT, and SLP Expected length of stay: [facility] 7 to 10 days Cultural Considerations: Native language Arabic. Born in [place], [place] fluent [place], [place], [place] and Hebrew. Equipment Needs: high fall risk due to decreased safety awareness Special Service Needs: SPouse daily visitng due to cognition and Language Additional Information: Patient adult daughter, Roney Mans who is autistic is in [place] after [place]. PAtient is unaware that she is a patient . Spouse is aware as well as brother, [name] Pt/Family Agrees to Admission and willing to participate: Yes Program Orientation Provided & Reviewed with Pt/Caregiver  Including Roles  & Responsibilities: Yes Additional Information Needs: son, Jameah Rouser a psychiatrist at [facility] patietn requests both he and her spouse kept in communication  Decrease burden of Care through IP rehab admission: n/a  Possible need for SNF placement upon discharge: not anticipated  Patient Condition: I have reviewed medical records from [facility] , spoken with CM, and patient and family member, son, [name]. I met with patient at the bedside for inpatient rehabilitation assessment.  Patient will benefit from ongoing PT, OT and SLP, can actively participate in 3 hours of therapy a day 5 days of the week, and can make measurable gains during the admission.  Patient will also benefit from the coordinated team approach during an Inpatient Acute Rehabilitation admission.  The patient will receive intensive therapy as well as Rehabilitation physician, nursing, social worker, and care management interventions.  Due to bladder management, bowel management, safety, skin/wound care, disease management, medication administration, pain management and patient education the patient requires 24 hour a day rehabilitation nursing.  The patient is currently min assist with mobility and basic ADLs.  Discharge setting and therapy post discharge at home with outpatient is anticipated.  Patient has agreed to participate in the Acute Inpatient Rehabilitation Program and will admit today.  Preadmission Screen Completed By:  Cleatrice Burke RN MSN 05/12/2019 12:01 PM ______________________________________________________________________   Discussed status with Dr. Letta Pate on  05/12/2019 at 1200 and received approval for admission today.  Admission Coordinator:  Cleatrice Burke, RN MSN, time  1200 Date  05/12/2019   Assessment/Plan: Diagnosis: TBI with gait disorder and aphasia 1. Does the need for close, 24 hr/day Medical supervision in concert with the patient's rehab needs make it  unreasonable for this patient to be served in a less intensive setting? Yes 2. 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 Due to bladder management, bowel management, safety, skin/wound care, disease management, medication administration, pain management and patient education, does the patient require 24 hr/day rehab nursing? Yes 4. Does the patient require coordinated care of a physician, rehab nurse, PT (1-2 hrs/day, 5 days/week), OT (1-2 hrs/day, 5 days/week) and SLP (.5-1 hrs/day, 5 days/week) to address physical and functional deficits in the context of the above medical diagnosis(es)? Yes Addressing deficits in the following areas: balance, endurance, locomotion, strength, transferring, bowel/bladder control, bathing, dressing, feeding, grooming, toileting, cognition, speech, language, swallowing and psychosocial support 5. Can the patient actively participate in an intensive therapy program of at least 3 hrs of therapy 5 days a week? Yes 6. The potential for patient to make measurable gains while on inpatient rehab is good 7. Anticipated functional outcomes upon discharge from inpatients are: supervision PT, supervision OT, supervision SLP 8. Estimated rehab length of stay to reach the above functional goals is: 7 to 10 days 9. Anticipated D/C setting: Home 10. Anticipated post D/C treatments: [facility] 11. Overall Rehab/Functional Prognosis: good  MD Signature: Charlett Blake M.D. [facility] FAAPM&R (Sports Med, Neuromuscular Med) Diplomate Am Board of Electrodiagnostic Med         Revision History

## 2019-05-12 NOTE — Progress Notes (Signed)
Subjective: Ms. Braddock was sitting up in bed eating breakfast.  Initially patient was stating that she was doing better, and her speech was improving.  She was able to recognize a pen and that she was in the hospital however after further evaluation she started repeating her name, medical hospital, and pen.   On evaluation later this morning patient was awake, husband at bedside who reported that she had spoken with her family members in arabic on the phone. He states that she still has been improving.    Exam: Vitals:   05/12/19 0344 05/12/19 0732  BP: (!) 132/100 137/70  Pulse: 79 76  Resp: 16 16  Temp:  97.7 F (36.5 C)  SpO2: 99% 100%   Gen: Sitting up in bed, no acute distress, appears comfortable Resp: Nonlabored breathing, no acute distress Abd: Soft, nontender, nondistended  Neuro: MS: Awake, alert, now occasionally following commands with no gesturing needed, improvement in her sentence formation but still perseverating on specific words CN: Visual acuity grossly intact, PERRLA, EOMI, no facial asymmetry, unable to assess sensation, tongue extrusion with no fasciculations, hearing grossly intact, still limited due to only intermittently following commands Motor:Moves all extremities, at least 4/5 but limited to only ocassionally following commands  Sensory: Sensation to light touch in upper and lower extremities DTR:  2+ in upper and lower extremities Planter: Downgoing bilaterally   Impression: This is a 73 year old female with a history of DM type 2, hypothroidism, 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 She has not had any episodes since that time.   She presented after a possible traumatic injury with confusion, aphasic, right facial droop  and right sided weakness. 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 She was continuing to have discharges on her LTM so she was loaded with depakote. 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   Started low dose ativan yesterday and increased depakote 750 mg twice daily.  Depakote level yesterday was 70.  Today patient continues to improve however continues to do so at a very slow pace, overall would recommend continuing current regimen. Likely will have slow progress and unclear what function she will be able to recover.   Recommendations: 1) Continue Keppra 1 g BID 2) Continue Depakote 750 mg BID, s/p 2g depakote load 3) Continue Ativan 1 mg q8 hours for one more day, discontinue tomorrow 4) Stable per neuro standpoint  Asencion Noble, M.D. PGY2 05/12/2019 8:27 AM

## 2019-05-12 NOTE — PMR Pre-admission (Signed)
PMR Admission Coordinator Pre-Admission Assessment  Patient: Cathy Harrington is an 73 y.o., female MRN: [id number] DOB: 09-Sep-1946 Height:   5'5" Weight:   172 lbs  Insurance Information HMO:     PPO:      PCP:      IPA:      80/20:      OTHER: no HMO PRIMARY: Medicare a and b      Policy#: [id number]      Subscriber: pt Benefits:  Phone #: passport one online     Name: 7/9 Eff. Date: 09/04/2011     Deduct: $1408      Out of Pocket Max: none      Life Max: none CIR: 100%      SNF: 20 full days Outpatient: 80%     Co-Pay: 20% Home Health: 100%      Co-Pay: none DME: 80%     Co-Pay: 20% Providers: pt choice  SECONDARY: AARP supplement      Policy#: [id number]      Subscriber: pt  Medicaid Application Date:       Case Manager:  Disability Application Date:       Case Worker:   The "Data Collection Information Summary" for patients in Inpatient Rehabilitation Facilities with attached "Privacy Act [place] Records" was provided and verbally reviewed with: Family  Emergency Contact Information Contact Information    Name Relation Home Work [place] [phone]  [phone]   Andreah, Goheen   [phone]      Current Medical History  Patient Admitting Diagnosis: TBI  History of Present Illness: 73 year old female with history of DM type 2, hypothyroidism, and suspected viral encephalitis 1937 complicated by seizures. Present to [facility] on [facility]/2019 following a possible traumatic injury to her head where a jar of rice fell on her head while cooking. New onset aphasia. Patient reported to be confused, aphasic right facial droop and right  Sided weakness. 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   Patient with history of extensive region of encephalomalacia in her temporal and frontal region form prior HSV encephalitis in 2017.  After that episode patient was completely mute for about a month but gradually recovered her speech over several months with residual dysnomia. She was on Keppra for seizures, but was taken off med about 1.5 years ago without seizure recurrence until this episode precipitating this current admission.   Neurology felt leading diagnosis is sequela of seizure disorder. Patient began on Keppra, followed by Depakote load/Depakote dosing secondary to continued discharges seen on LTM. Neurology now recommending to increase Depakote and have started Ativan to see if faster improvement of presentation. Appears some improvement but still saying repetitive words/questions.    Patient's medical record from [facility]  has been reviewed by the rehabilitation admission coordinator and physician.  Past Medical History  Past Medical History:  Diagnosis Date  . Diabetes mellitus without complication ([place])   . Hypothyroidism   . Stroke ([place])    2017 1 week [facility] and 2 weeks [facility] 6 months rehab  . Thyroid disease     Family History   family history is not on file.  Prior Rehab/Hospitalizations Has the patient had prior rehab or hospitalizations prior to admission? Yes 2017 inpatient acute Rehab at [facility] followed by outpatient therapy for a few months Has the patient had major surgery during 100  days prior to admission? No   Current Medications  Current Facility-Administered Medications:  .  acetaminophen (TYLENOL) tablet 650 mg, 650 mg, Oral, Q4H PRN **OR** acetaminophen (TYLENOL) solution 650 mg, 650 mg, Per Tube, Q4H PRN **OR** acetaminophen (TYLENOL) suppository 650 mg, 650 mg, Rectal, Q4H PRN, Karmen Bongo, MD .  aspirin EC tablet 81 mg, 81 mg, Oral, Daily, Karmen Bongo, MD, 81 mg at 05/12/19 1126 .  enoxaparin (LOVENOX) injection 40 mg, 40 mg, Subcutaneous, Q24H, Karmen Bongo, MD, 40 mg at 05/11/19 1326 .  insulin aspart (novoLOG) injection 0-15 Units, 0-15 Units, Subcutaneous, TID WC,  Karmen Bongo, MD, 2 Units at 05/12/19 [phone] .  insulin aspart (novoLOG) injection 0-5 Units, 0-5 Units, Subcutaneous, QHS, Karmen Bongo, MD .  levETIRAcetam (KEPPRA) tablet 1,000 mg, 1,000 mg, Oral, BID, [place], Yujing Z, RPH, 1,000 mg at 05/12/19 1127 .  levothyroxine (SYNTHROID) tablet 100 mcg, 100 mcg, Oral, Q0600, Karmen Bongo, MD, 100 mcg at 05/12/19 0512 .  LORazepam (ATIVAN) injection 1-2 mg, 1-2 mg, Intravenous, Q2H PRN, Karmen Bongo, MD .  LORazepam (ATIVAN) tablet 1 mg, 1 mg, Oral, Q8H, Asencion Noble, MD, 1 mg at 05/12/19 0512 .  pravastatin (PRAVACHOL) tablet 10 mg, 10 mg, Oral, q1800, Karmen Bongo, MD, 10 mg at 05/11/19 1719 .  senna-docusate (Senokot-S) tablet 1 tablet, 1 tablet, Oral, QHS PRN, Karmen Bongo, MD .  valproic acid (DEPAKENE) 250 MG capsule 750 mg, 750 mg, Oral, BID, Sherry Ruffing, Marissa M, MD, 750 mg at 05/12/19 1126  Patients Current Diet:  Diet Order            Diet heart healthy/carb modified Room service appropriate? No; Fluid consistency: Thin  Diet effective 1000              Precautions / Restrictions Precautions Precautions: Fall Precaution Comments: has reverted back to Arabic Restrictions Weight Bearing Restrictions: No   Has the patient had 2 or more falls or a fall with injury in the past year? No  Prior Activity Level Community (5-7x/wk): Independent without AD; driving, cooking, cleaning, etc  Prior Functional Level Self Care: Did the patient need help bathing, dressing, using the toilet or eating? Independent  Indoor Mobility: Did the patient need assistance with walking from room to room (with or without device)? Independent  Stairs: Did the patient need assistance with internal or external stairs (with or without device)? Independent  Functional Cognition: Did the patient need help planning regular tasks such as shopping or remembering to take medications? Independent  Home Assistive Devices / Equipment Home  Assistive Devices/Equipment: None Home Equipment: None  Prior Device Use: Indicate devices/aids used by the patient prior to current illness, exacerbation or injury? None of the above  Current Functional Level Cognition  Arousal/Alertness: Awake/alert Overall Cognitive Status: Difficult to assess Difficult to assess due to: Impaired communication, Non-English speaking Orientation Level: Oriented to person Following Commands: Follows one step commands inconsistently Safety/Judgement: Decreased awareness of deficits, Decreased awareness of safety General Comments: per husband, pt still has some confusion when he is speaking to her in Arabic; pt speaking in English at times however not answering questions consistently (answering one way when therapists asks and a different answer when husband asks in Arabic) Attention: Focused, Sustained Focused Attention: Appears intact Sustained Attention: Appears intact Awareness: Impaired Awareness Impairment: Intellectual impairment, Emergent impairment Problem Solving: Impaired Problem Solving Impairment: Functional basic, Verbal basic    Extremity Assessment (includes Sensation/Coordination)  Upper Extremity Assessment: Generalized weakness  Lower Extremity Assessment: Defer to  PT evaluation    ADLs  Overall ADL's : Needs assistance/impaired Eating/Feeding: Minimal assistance, Bed level Grooming: Wash/dry hands, Wash/dry face, Oral care, Brushing hair, Total assistance, Bed level Grooming Details (indicate cue type and reason): Pt does not recognize and is not able to demonstrate use of comb or toothbrush.  When hand over hand assist provided, she was unable to brush her teeth, and procceded to try to eat the toothpaste  Upper Body Bathing: Total assistance, Bed level Lower Body Bathing: Total assistance, Bed level Upper Body Dressing : Total assistance, Bed level Lower Body Dressing: Total assistance, Bed level Toilet Transfer: Minimal  assistance, Stand-pivot, BSC Toileting- Clothing Manipulation and Hygiene: Maximal assistance, Sit to/from stand Functional mobility during ADLs: Minimal assistance General ADL Comments: Pt limited by communication deficits as well as ideational apraxia.  LT EEG monitoring also makes it difficult to engage pt in full ADL     Mobility  Overal bed mobility: Needs Assistance Bed Mobility: Supine to Sit Supine to sit: Supervision General bed mobility comments: for safety; pt has no awarness of IV line     Transfers  Overall transfer level: Needs assistance Equipment used: (assist at trunk with gait belt ) Transfers: Sit to/from Stand Sit to Stand: Min guard Stand pivot transfers: Min assist General transfer comment: min guard for safety    Ambulation / Gait / Stairs / Wheelchair Mobility  Ambulation/Gait Ambulation/Gait assistance: Min assist, Counsellor (Feet): 160 Feet Assistive device: (assist at trunk with gait belt) Gait Pattern/deviations: Step-through pattern, Decreased stride length, Drifts right/left General Gait Details: narrow BOS and unsteady especially with challenges to balance; assist for balance  Gait velocity interpretation: <1.31 ft/sec, indicative of household ambulator    Posture / Balance Balance Overall balance assessment: Needs assistance Sitting-balance support: Feet supported, No upper extremity supported Sitting balance-Leahy Scale: Good Standing balance support: No upper extremity supported Standing balance-Leahy Scale: Fair Standing balance comment: pt impulsive and reaching for chair, needs physical assist for safetly    Special needs/care consideration BiPAP/CPAP  N/a CPM  N/a Continuous Drip IV  N/a Dialysis  N/a Life Vest  N/a Oxygen  N/a Special Bed  High fall risk; low bed with floor pads Trach Size  N/a Wound Vac n/a Skin intact Bowel mgmt:  Continent LBM 7/6 Bladder mgmt: incontinent; external catheter Diabetic mgmt:  Hgb  A1c 7.2 Behavioral consideration  High fall risk; impulsive and decreased safety awareness. Spouse Geoffery Spruce approved to visit daily. He is aware that this will be assessed daily to be able to continue by Nursing and Physician staff Chemo/radiation  N/a Native Arabic language. [place] nationality. Speaks Hebrew due to working in [place] in [place]. Pta spoke fluent [place]. Ebbie Ridge I have asked spouse to be present 8 am until 3 pm first day of evals for interpret and then will be reevaled if he can continue to be present   Previous Home Environment  Living Arrangements: Spouse/significant other(adult daughter/ Autistic Sales promotion account executive))  Lives With: Spouse, Daughter Available Help at Discharge: Family, Available 24 hours/day(spouse, Yvetta Coder, will be caregiver) Type of Home: House Home Layout: Two level, Laundry or work area in basement, Able to live on main level with bedroom/bathroom(full basement with laundry and pantry storage) Alternate Level Stairs-Rails: Right Alternate Level Stairs-Number of Steps: flight 21 steps down to full basement wiht laundry and pantry storage Home Access: Stairs to enter Entrance Stairs-Rails: Right Entrance Stairs-Number of Steps: 4 Bathroom Shower/Tub: Multimedia programmer: Standard Bathroom Accessibility: Yes  How Accessible: Accessible via walker Home Care Services: No  Discharge Living Setting Plans for Discharge Living Setting: Patient's home, Lives with (comment)(spouse and adult autistic daughter, Roney Mans) Type of Home at Discharge: House Discharge Home Layout: Two level, Laundry or work area in basement, Able to live on main level with bedroom/bathroom(basement with IT sales professional) Alternate Level Stairs-Rails: Right Alternate Level Stairs-Number of Steps: 21 steps down to basement Discharge Home Access: Stairs to enter Entrance Stairs-Rails: Right Entrance Stairs-Number of Steps: 4 Discharge Bathroom Shower/Tub: Walk-in  shower Discharge Bathroom Toilet: Standard Discharge Bathroom Accessibility: Yes How Accessible: Accessible via walker Does the patient have any problems obtaining your medications?: No  Social/Family/Support Systems Patient Roles: Spouse, Parent Contact Information: spouse, "Geoffery Spruce" Yvetta Coder Anticipated Caregiver: spouse Anticipated Caregiver's Contact Information: cell [phone] Ability/Limitations of Caregiver: no limitations Caregiver Availability: 24/7 Discharge Plan Discussed with Primary Caregiver: Yes Is Caregiver In Agreement with Plan?: Yes Does Caregiver/Family have Issues with Lodging/Transportation while Pt is in Rehab?: No(spouse has been coming daily for 4 to 6 hrs due to patient c) cognition and language issues  Goals/Additional Needs Patient/Family Goal for Rehab: superivsion PT, OT, and SLP Expected length of stay: ELOS 7 to 10 days Cultural Considerations: Native language Arabic. Born in [place], [place] fluent [place], [place], [place] and Hebrew. Equipment Needs: high fall risk due to decreased safety awareness Special Service Needs: SPouse daily visitng due to cognition and Language Additional Information: Patient adult daughter, Roney Mans who is autistic is in [place] after [place]. PAtient is unaware that she is a patient . Spouse is aware as well as brother, [name] Pt/Family Agrees to Admission and willing to participate: Yes Program Orientation Provided & Reviewed with Pt/Caregiver Including Roles  & Responsibilities: Yes Additional Information Needs: son, Tais Koestner a psychiatrist at [facility] patietn requests both he and her spouse kept in communication  Decrease burden of Care through IP rehab admission: n/a  Possible need for SNF placement upon discharge: not anticipated  Patient Condition: I have reviewed medical records from [facility] , spoken with CM, and patient and family member, son, [name]. I met with patient at the bedside for inpatient rehabilitation  assessment.  Patient will benefit from ongoing PT, OT and SLP, can actively participate in 3 hours of therapy a day 5 days of the week, and can make measurable gains during the admission.  Patient will also benefit from the coordinated team approach during an Inpatient Acute Rehabilitation admission.  The patient will receive intensive therapy as well as Rehabilitation physician, nursing, social worker, and care management interventions.  Due to bladder management, bowel management, safety, skin/wound care, disease management, medication administration, pain management and patient education the patient requires 24 hour a day rehabilitation nursing.  The patient is currently min assist with mobility and basic ADLs.  Discharge setting and therapy post discharge at home with outpatient is anticipated.  Patient has agreed to participate in the Acute Inpatient Rehabilitation Program and will admit today.  Preadmission Screen Completed By:  Cleatrice Burke RN MSN 05/12/2019 12:01 PM ______________________________________________________________________   Discussed status with Dr. Letta Pate on  05/12/2019 at 1200 and received approval for admission today.  Admission Coordinator:  Cleatrice Burke, RN MSN, time  1200 Date  05/12/2019   Assessment/Plan: Diagnosis: TBI with gait disorder and aphasia 1. Does the need for close, 24 hr/day Medical supervision in concert with the patient's rehab needs make it unreasonable for this patient to be served in a less intensive setting? Yes 2.  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 Due to bladder management, bowel management, safety, skin/wound care, disease management, medication administration, pain management and patient education, does the patient require 24 hr/day rehab nursing? Yes 4. Does the patient require coordinated care  of a physician, rehab nurse, PT (1-2 hrs/day, 5 days/week), OT (1-2 hrs/day, 5 days/week) and SLP (.5-1 hrs/day, 5 days/week) to address physical and functional deficits in the context of the above medical diagnosis(es)? Yes Addressing deficits in the following areas: balance, endurance, locomotion, strength, transferring, bowel/bladder control, bathing, dressing, feeding, grooming, toileting, cognition, speech, language, swallowing and psychosocial support 5. Can the patient actively participate in an intensive therapy program of at least 3 hrs of therapy 5 days a week? Yes 6. The potential for patient to make measurable gains while on inpatient rehab is good 7. Anticipated functional outcomes upon discharge from inpatients are: supervision PT, supervision OT, supervision SLP 8. Estimated rehab length of stay to reach the above functional goals is: 7 to 10 days 9. Anticipated D/C setting: Home 10. Anticipated post D/C treatments: [facility] therapy 11. Overall Rehab/Functional Prognosis: good  MD Signature: Charlett Blake M.D. [employer] FAAPM&R (Sports Med, Neuromuscular Med) Diplomate Am Board of Electrodiagnostic Med
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
## 2019-05-12 NOTE — Plan of Care (Signed)
Progressing towards goals

## 2019-05-12 NOTE — Progress Notes (Signed)
Inpatient Rehabilitation Admissions Coordinator  I met with patient and her spouse, Yvetta Coder, at bedside. I have an inpt rehab bed that patient can be admitted to today. Patient and spouse in agreement. I will contact Dr. Lonny Prude to make the arrangements.   Danne Baxter, RN, MSN Rehab Admissions Coordinator [phone] 05/12/2019 10:41 AM

## 2019-05-12 NOTE — Progress Notes (Signed)
Pt arrived to unit via bed, pt alert to self, no c/o of pain, pt has clothes and cell phone.

## 2019-05-12 NOTE — Discharge Instructions (Signed)
Cathy Harrington,  You were in the hospital because of difficulty speaking and found to have seizures. You have been started on medication to help control these seizures. Your speaking difficulties may take a while to resolve. You have been referred to inpatient rehabilitation on discharge from the hospital.

## 2019-05-12 NOTE — Discharge Summary (Signed)
Physician Discharge Summary  Cathy Harrington HCW:[id number] DOB: 22-Nov-1945 DOA: 05/08/2019  PCP: System, Pcp Not In  Admit date: 05/08/2019 Discharge date: 05/12/2019  Admitted From: Home Disposition: Inpatient rehabilitation  Recommendations for Outpatient Follow-up:  1. Follow up with PCP in 1 week 2. Please obtain BMP/CBC in one week 3. Please follow up on the following pending results: None   Discharge Condition: Stable CODE STATUS: Full code Diet recommendation: Heart healthy/carb modified   Brief/Interim Summary:  Admission HPI written by Karmen Bongo, MD   HPI: Cathy Harrington is a 73 y.o. 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 She had SLP services after that hospitalization for cognitive/linguistic therapy and was discharged on 11/23/18.   She presented to [facility] after a possible traumatic injury (a jar may have fallen and hit her head), and she developed aphasia.  She had some R facial droop and drooling and then developed twitching of her R face.  Teleneurology was consulted and suggested this could be partial complex status epilepticus; she was given Keppra and Ativan and transferred to [facility] for further neurologic evaluation since she did not return to baseline.  She was pleasant and alert at the time of my evaluation but unable to answer questions.  She intermittently follows commands.  I spoke with her son.  This is how she presented 3 years ago.  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  Her temporal lobes are "basically obliterated."  She was aphasic for weeks to a month and slow regained her speech over time.   She was on Keppra for about a year and she was taken off the Athalia about 1 year after prior  hospitalization without further seizure activity.  Her son spoke with her about an hour before she went to the hospital yesterday and was normal.  She regained about 90% of her function following last hospitalization - the main deficit is her inability to recall names (people, food, places).  Last time this happened, she reverted to her native language (arabic, [place]); her English is quite good.    Hospital course:  Aphasia Neurology consulted. Leading diagnosis is sequela of seizure disorder. 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 Patient started on Keppra, followed by Depakote load/Depakote dosing secondary to continued discharges seen on LTM. Neurology now recommending to increase Depakote. Trial of Ativan did not show significant improvement. Expected slow improvement in symptoms over time. Ativan for today, and discontinue starting 7/10. Neurology follow-up while in inpatient therapy as needed.  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 Patient is not on medication as an outpatient. Medications as mentioned above  Diabetes mellitus, type 2 Hemoglobin A1C of 7.2%. Patient is on glipizide and metformin as an outpatient. Continue on discharge.  Hypothyroidism TSH of 1.2. Continue Synthroid  Hyperlipidemia Patient is on lovastatin as an outpatient. Continue  Discharge Diagnoses:  Principal Problem:   Aphasia Active Problems:   Seizure ([place])   Encephalitis   Diabetes mellitus type 2 in obese [facility])   Hypothyroidism (acquired)    Discharge Instructions  Discharge Instructions    Increase activity slowly   Complete by: As directed      Allergies as of 05/12/2019  Reactions   Tetanus Toxoid Rash      Medication List    TAKE these medications   aspirin EC 81 MG tablet Take 81 mg by mouth daily.   divalproex 250 MG DR tablet Commonly known as: DEPAKOTE Take 3 tablets (750 mg total) by mouth 2  (two) times daily.   glipiZIDE 5 MG tablet Commonly known as: GLUCOTROL Take 5 mg by mouth daily before breakfast.   levETIRAcetam 1000 MG tablet Commonly known as: KEPPRA Take 1 tablet (1,000 mg total) by mouth 2 (two) times daily.   levothyroxine 100 MCG tablet Commonly known as: SYNTHROID Take 100 mcg by mouth daily.   LORazepam 1 MG tablet Commonly known as: ATIVAN Take 1 tablet (1 mg total) by mouth every 8 (eight) hours for 2 doses.   lovastatin 10 MG tablet Commonly known as: MEVACOR Take 10 mg by mouth at bedtime.   metFORMIN 1000 MG tablet Commonly known as: GLUCOPHAGE Take 1,000 mg by mouth 2 (two) times daily.       Allergies  Allergen Reactions   Tetanus Toxoid Rash    Consultations:  Neurology   Procedures/Studies: Dg Chest 2 View  Result Date: 05/10/2019 CLINICAL DATA:  MRI clearance. Confusion without chest pain. History of stroke. EXAM: CHEST - 2 VIEW COMPARISON:  None. FINDINGS: The lung volumes are somewhat low. There may be a left-sided pleural effusion with adjacent atelectasis. There is no pneumothorax. There is no unexpected metallic foreign body. There are mild degenerative changes throughout the thoracic spine. IMPRESSION: 1. Low lung volumes with probable atelectasis at the left lung base. 2. No unexpected metallic foreign body. Electronically Signed   By: Constance Holster M.D.   On: 05/10/2019 21:45   Mr Brain Wo Contrast  Result Date: 05/10/2019 CLINICAL DATA:  Initial evaluation for acute aphasia, encephalopathy. EXAM: MRI HEAD WITHOUT CONTRAST TECHNIQUE: Multiplanar, multiecho pulse sequences of the brain and surrounding structures were obtained without intravenous contrast. COMPARISON:  Prior CT from 05/07/2019. FINDINGS: Brain: Cerebral volume within normal limits for age. Chronic encephalomalacia and gliosis involving the anterior left insula and anterior left temporal lobe, stable. Focal encephalomalacia involving the anterior left  cingulate also unchanged. No abnormal foci of restricted diffusion to suggest acute or subacute ischemia. Gray-white matter differentiation maintained. No other areas of chronic cortical infarction. No evidence for acute intracranial hemorrhage. No mass lesion, midline shift or mass effect. Ex vacuo dilatation of the temporal horn of the left lateral ventricle related to the left temporal encephalomalacia. No hydrocephalus. No extra-axial fluid collection. Pituitary gland suprasellar region normal. Midline structures intact. Vascular: Major intracranial vascular flow voids are well maintained. Skull and upper cervical spine: Craniocervical junction within normal limits. Upper cervical spine normal. Bone marrow signal intensity within normal limits. No scalp soft tissue abnormality. Sinuses/Orbits: Globes and orbital soft tissues within normal limits. Left maxillary sinus retention cyst noted. Paranasal sinuses are otherwise largely clear. No significant mastoid effusion. Inner ear structures grossly normal. Other: None. IMPRESSION: 1. No acute intracranial abnormality. 2. 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 3. Otherwise unremarkable brain MRI for age. Electronically Signed   By: Jeannine Boga M.D.   On: 05/10/2019 22:32   Ct Head Code Stroke Wo Contrast  Addendum Date: 05/07/2019   ADDENDUM REPORT: 05/07/2019 17:14 ADDENDUM: Study discussed by telephone with Dr. Nance Pear on 05/07/2019 at 1703 hours. Electronically Signed   By: Genevie Ann M.D.   On: 05/07/2019 17:14  Result Date: 05/07/2019 CLINICAL DATA:  Code stroke. 73 year old female reportedly status post blunt trauma, but loss of speech and right side weakness. EXAM: CT HEAD WITHOUT CONTRAST TECHNIQUE: Contiguous axial images were obtained from the base of the skull through the vertex without intravenous contrast. COMPARISON:  Brain MRI 06/18/2016 and earlier. FINDINGS:  Brain: Extensive chronic left temporal lobe and insula encephalomalacia which was developing but has progressed since the 2017 brain MRI. There is also some associated encephalomalacia of the anterior left cingulate gyrus. Associated mild ex vacuo enlargement now of the left lateral ventricle. Associated white matter hypodensity about the left frontal horn. Elsewhere gray-white matter differentiation is preserved. No midline shift, ventriculomegaly, mass effect, evidence of mass lesion, intracranial hemorrhage or evidence of cortically based acute infarction. Vascular: Calcified atherosclerosis at the skull base. No suspicious intracranial vascular hyperdensity. Skull: Stable and negative. Sinuses/Orbits: Visualized paranasal sinuses and mastoids are stable and well pneumatized. Other: Visualized orbits and scalp soft tissues are within normal limits. ASPECTS [facility] Stroke Program Early CT Score) - Ganglionic level infarction (caudate, lentiform nuclei, internal capsule, insula, M1-M3 cortex): 7 - Supraganglionic infarction (M4-M6 cortex): 3 Total score (0-10 with 10 being normal): 10 (extensive chronic left temporal lobe and insula encephalomalacia). IMPRESSION: 1. Extensive left temporal lobe, insula and also left cingulate gyrus encephalomalacia appears related to the 2017 insult. 2. No acute cortically based infarct or acute intracranial hemorrhage identified. ASPECTS is 10. Electronically Signed: By: Genevie Ann M.D. On: 05/07/2019 16:58       Subjective: Patient with no concerns today other than wanting the TV on.  Discharge Exam: Vitals:   05/12/19 0732 05/12/19 1158  BP: 137/70 137/79  Pulse: 76 82  Resp: 16 18  Temp: 97.7 F (36.5 C) 97.6 F (36.4 C)  SpO2: 100% 99%   Vitals:   05/11/19 2358 05/12/19 0344 05/12/19 0732 05/12/19 1158  BP: 139/70 (!) 132/100 137/70 137/79  Pulse: 77 79 76 82  Resp: 16 16 16 18   Temp: 98 F (36.7 C)  97.7 F (36.5 C) 97.6 F (36.4 C)  TempSrc: Oral   Axillary Oral  SpO2: 97% 99% 100% 99%    General exam: Appears calm and comfortable Respiratory system: Clear to auscultation. Respiratory effort normal. Cardiovascular system: S1 & S2 heard, RRR. No murmurs, rubs, gallops or clicks. Gastrointestinal system: Abdomen is nondistended, soft and nontender. No organomegaly or masses felt. Normal bowel sounds heard. Central nervous system: Alert and oriented to person. States she is in a medical hospital, but continues to repeat "medical hospital" for many questions. Extremities: No edema. No calf tenderness Skin: No cyanosis. No rashes Psychiatry: Affect blunt     The results of significant diagnostics from this hospitalization (including imaging, microbiology, ancillary and laboratory) are listed below for reference.     Microbiology: Recent Results (from the past 240 hour(s))  Novel Coronavirus,NAA,(SEND-OUT TO REF LAB - TAT 24-48 hrs); Hosp Order     Status: None   Collection Time: 05/07/19  7:34 PM   Specimen: Nasopharyngeal Swab; Respiratory  Result Value Ref Range Status   SARS-CoV-2, NAA NOT DETECTED NOT DETECTED Final    Comment: (NOTE) This test was developed and its performance characteristics determined by [employer]. This test has not been FDA cleared or approved. This test has been authorized by FDA under an Emergency Use Authorization (EUA). This test is only authorized for the duration of time the declaration that circumstances exist justifying the authorization of the emergency use of in vitro  diagnostic tests for detection of SARS-CoV-2 virus and/or diagnosis of COVID-19 infection under section 564(b)(1) of the Act, 21 U.S.C. 370WUG-8(B)(1), unless the authorization is terminated or revoked sooner. When diagnostic testing is negative, the possibility of a false negative result should be considered in the context of a patient's recent exposures and the presence of clinical signs and symptoms consistent with  COVID-19. An individual without symptoms of COVID-19 and who is not shedding SARS-CoV-2 virus would expect to have a negative (not detected) result in this assay. Performed  At: [facility] [address] [place], [place] [id number] Rush Farmer MD KC:[id number]    [place]  Final    Comment: Performed at [facility], [address] [place], [place] [address]  SARS Coronavirus 2 ([place]HE[place] - Performed in [facility] hospital lab), Hosp Order     Status: None   Collection Time: 05/08/19 11:14 AM   Specimen: Nasopharyngeal Swab  Result Value Ref Range Status   SARS Coronavirus 2 NEGATIVE NEGATIVE Final    Comment: (NOTE) If result is NEGATIVE SARS-CoV-2 target nucleic acids are NOT DETECTED. The SARS-CoV-2 RNA is generally detectable in upper and lower  respiratory specimens during the acute phase of infection. The lowest  concentration of SARS-CoV-2 viral copies this assay can detect is 250  copies / mL. A negative result does not preclude SARS-CoV-2 infection  and should not be used as the sole basis for treatment or other  patient management decisions.  A negative result may occur with  improper specimen collection / handling, submission of specimen other  than nasopharyngeal swab, presence of viral mutation(s) within the  areas targeted by this assay, and inadequate number of viral copies  (<250 copies / mL). A negative result must be combined with clinical  observations, patient history, and epidemiological information. If result is POSITIVE SARS-CoV-2 target nucleic acids are DETECTED. The SARS-CoV-2 RNA is generally detectable in upper and lower  respiratory specimens dur ing the acute phase of infection.  Positive  results are indicative of active infection with SARS-CoV-2.  Clinical  correlation with patient history and other diagnostic information is  necessary to determine patient infection status.  Positive results do  not  rule out bacterial infection or co-infection with other viruses. If result is PRESUMPTIVE POSTIVE SARS-CoV-2 nucleic acids MAY BE PRESENT.   A presumptive positive result was obtained on the submitted specimen  and confirmed on repeat testing.  While 2019 novel coronavirus  (SARS-CoV-2) nucleic acids may be present in the submitted sample  additional confirmatory testing may be necessary for epidemiological  and / or clinical management purposes  to differentiate between  SARS-CoV-2 and other Sarbecovirus currently known to infect humans.  If clinically indicated additional testing with an alternate test  methodology [phone]) is advised. The SARS-CoV-2 RNA is generally  detectable in upper and lower respiratory sp ecimens during the acute  phase of infection. The expected result is Negative. Fact Sheet for Patients:  StrictlyIdeas.no Fact Sheet for Healthcare Providers: BankingDealers.co.za This test is not yet approved or cleared by the [place] FDA and has been authorized for detection and/or diagnosis of SARS-CoV-2 by FDA under an Emergency Use Authorization (EUA).  This EUA will remain in effect (meaning this test can be used) for the duration of the COVID-19 declaration under Section 564(b)(1) of the Act, 21 U.S.C. section 360bbb-3(b)(1), unless the authorization is terminated or revoked sooner. Performed at [facility], [place] [address] [place], [place] [address]  Labs: BNP (last 3 results) No results for input(s): BNP in the last 8760 hours. Basic Metabolic Panel: Recent Labs  Lab 05/07/19 1642 05/11/19 1152  NA 142 140  K 4.1 4.0  CL 111 107  CO2 21* 23  GLUCOSE 110* 185*  BUN 15 13  CREATININE 0.76 0.78  CALCIUM 9.5 9.3   Liver Function Tests: Recent Labs  Lab 05/07/19 1642  AST 42*  ALT 60*  ALKPHOS 61  BILITOT 0.5  PROT 7.8  ALBUMIN 4.7   No results for input(s): LIPASE, AMYLASE in the  last 168 hours. No results for input(s): AMMONIA in the last 168 hours. CBC: Recent Labs  Lab 05/07/19 1642  WBC 6.7  NEUTROABS 3.6  HGB 13.1  HCT 40.7  MCV 87.3  PLT 245   Cardiac Enzymes: No results for input(s): CKTOTAL, CKMB, CKMBINDEX, TROPONINI in the last 168 hours. BNP: Invalid input(s): POCBNP CBG: Recent Labs  Lab 05/11/19 1307 05/11/19 1708 05/11/19 2113 05/12/19 0612 05/12/19 1157  GLUCAP 122* 125* 190* 136* 147*   D-Dimer No results for input(s): DDIMER in the last 72 hours. Hgb A1c No results for input(s): HGBA1C in the last 72 hours. Lipid Profile No results for input(s): CHOL, HDL, LDLCALC, TRIG, CHOLHDL, LDLDIRECT in the last 72 hours. Thyroid function studies No results for input(s): TSH, T4TOTAL, T3FREE, THYROIDAB in the last 72 hours.  Invalid input(s): FREET3 Anemia work up No results for input(s): VITAMINB12, FOLATE, FERRITIN, TIBC, IRON, RETICCTPCT in the last 72 hours. Urinalysis    Component Value Date/Time   COLORURINE YELLOW (A) 04/24/2016 1045   APPEARANCEUR CLEAR (A) 04/24/2016 1045   LABSPEC 1.012 04/24/2016 1045   PHURINE 6.0 04/24/2016 1045   GLUCOSEU 50 (A) 04/24/2016 1045   HGBUR NEGATIVE 04/24/2016 1045   BILIRUBINUR NEGATIVE 04/24/2016 [place] 04/24/2016 [place] 04/24/2016 1045   NITRITE NEGATIVE 04/24/2016 1045   LEUKOCYTESUR NEGATIVE 04/24/2016 1045   Sepsis Labs Invalid input(s): PROCALCITONIN,  WBC,  LACTICIDVEN Microbiology Recent Results (from the past 240 hour(s))  Novel Coronavirus,NAA,(SEND-OUT TO REF LAB - TAT 24-48 hrs); Hosp Order     Status: None   Collection Time: 05/07/19  7:34 PM   Specimen: Nasopharyngeal Swab; Respiratory  Result Value Ref Range Status   SARS-CoV-2, NAA NOT DETECTED NOT DETECTED Final    Comment: (NOTE) This test was developed and its performance characteristics determined by [employer]. This test has not been FDA cleared or approved.  This test has been authorized by FDA under an Emergency Use Authorization (EUA). This test is only authorized for the duration of time the declaration that circumstances exist justifying the authorization of the emergency use of in vitro diagnostic tests for detection of SARS-CoV-2 virus and/or diagnosis of COVID-19 infection under section 564(b)(1) of the Act, 21 U.S.C. 062IRS-8(N)(4), unless the authorization is terminated or revoked sooner. When diagnostic testing is negative, the possibility of a false negative result should be considered in the context of a patient's recent exposures and the presence of clinical signs and symptoms consistent with COVID-19. An individual without symptoms of COVID-19 and who is not shedding SARS-CoV-2 virus would expect to have a negative (not detected) result in this assay. Performed  At: [facility] [address] [place], [place] [id number] Rush Farmer MD FG:[id number]    [place]  Final    Comment: Performed at [facility], [address] [place], [place] [address]  SARS Coronavirus 2 ([place] -  Performed in [facility] hospital lab), Hosp Order     Status: None   Collection Time: 05/08/19 11:14 AM   Specimen: Nasopharyngeal Swab  Result Value Ref Range Status   SARS Coronavirus 2 NEGATIVE NEGATIVE Final    Comment: (NOTE) If result is NEGATIVE SARS-CoV-2 target nucleic acids are NOT DETECTED. The SARS-CoV-2 RNA is generally detectable in upper and lower  respiratory specimens during the acute phase of infection. The lowest  concentration of SARS-CoV-2 viral copies this assay can detect is 250  copies / mL. A negative result does not preclude SARS-CoV-2 infection  and should not be used as the sole basis for treatment or other  patient management decisions.  A negative result may occur with  improper specimen collection / handling, submission of specimen other  than nasopharyngeal swab,  presence of viral mutation(s) within the  areas targeted by this assay, and inadequate number of viral copies  (<250 copies / mL). A negative result must be combined with clinical  observations, patient history, and epidemiological information. If result is POSITIVE SARS-CoV-2 target nucleic acids are DETECTED. The SARS-CoV-2 RNA is generally detectable in upper and lower  respiratory specimens dur ing the acute phase of infection.  Positive  results are indicative of active infection with SARS-CoV-2.  Clinical  correlation with patient history and other diagnostic information is  necessary to determine patient infection status.  Positive results do  not rule out bacterial infection or co-infection with other viruses. If result is PRESUMPTIVE POSTIVE SARS-CoV-2 nucleic acids MAY BE PRESENT.   A presumptive positive result was obtained on the submitted specimen  and confirmed on repeat testing.  While 2019 novel coronavirus  (SARS-CoV-2) nucleic acids may be present in the submitted sample  additional confirmatory testing may be necessary for epidemiological  and / or clinical management purposes  to differentiate between  SARS-CoV-2 and other Sarbecovirus currently known to infect humans.  If clinically indicated additional testing with an alternate test  methodology [phone] is advised. The SARS-CoV-2 RNA is generally  detectable in upper and lower respiratory sp ecimens during the acute  phase of infection. The expected result is Negative. Fact Sheet for Patients:  StrictlyIdeas.no Fact Sheet for Healthcare Providers: BankingDealers.co.za This test is not yet approved or cleared by the [place] FDA and has been authorized for detection and/or diagnosis of SARS-CoV-2 by FDA under an Emergency Use Authorization (EUA).  This EUA will remain in effect (meaning this test can be used) for the duration of the COVID-19 declaration under  Section 564(b)(1) of the Act, 21 U.S.C. section 360bbb-3(b)(1), unless the authorization is terminated or revoked sooner. Performed at [facility], [place] [address], [place], [place] [address]      Time coordinating discharge: 35 minutes  SIGNED:   Cordelia Poche, MD [facility] 05/12/2019, 12:31 PM
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
## 2019-05-12 NOTE — Progress Notes (Signed)
Taken from [employer] screen, husband not at bedside

## 2019-05-12 NOTE — Progress Notes (Signed)
PROGRESS NOTE    Cathy Harrington  ZYS:[id number] DOB: 05-12-1946 DOA: 05/08/2019 PCP: System, Pcp Not In   Brief Narrative: Cathy Harrington is a 73 y.o. femalewith medical history significant ofT2DM, hypothyroidism, and suspected viral encephalitis in 2017 though CSF was not tested for HSV then, seizure during hospitalization for encephalitis in 2017. Patient presented secondary to possible traumatic brain injury and subsequent development of aphasia with associated right facial droop and facial twitching. Concern for partial seizure and patient started on AEDs; confirmed on LTM.   Assessment & Plan:   Principal Problem:   Aphasia Active Problems:   Seizure ([place])   Encephalitis   Diabetes mellitus type 2 in obese ([place])   Hypothyroidism (acquired)   Aphasia Neurology on board. Leading diagnosis is sequela of seizure disorder. Prior history of HSV encephalitis affecting temporal lobes. Patient started on Keppra, followed by Depakote load/Depakote dosing secondary to continued discharges seen on LTM. Neurology now recommending to increase Depakote and have started Ativan scheduled to see if there is faster improvement of presentation. Appears to be some improvement in how many questions she answers and ease of conversation, but still saying repetitive words/phrases. -PT recommending CIR  Seizure disorder Secondary to history of HSV encephalitis. Patient is not on medication as an outpatient. Medications as mentioned above  Diabetes mellitus, type 2 Hemoglobin A1C of 7.2%. Patient is on glipizide and metformin as an outpatient. -Continue SSI  Hypothyroidism TSH of 1.2 -Continue Synthroid  Hyperlipidemia Patient is on lovastatin as an outpatient -Continue pravastatin (formulary substitution)    DVT prophylaxis: Lovenox Code Status:   Code Status: Full Code Family Communication: None Disposition Plan: Discharge possibly to CIR once neurology has signed off   Consultants:    Neurology  Procedures:   LTM  Antimicrobials:  None    Subjective: No concerns today  Objective: Vitals:   05/11/19 1931 05/11/19 2358 05/12/19 0344 05/12/19 0732  BP: 124/74 139/70 (!) 132/100 137/70  Pulse: 82 77 79 76  Resp: 16 16 16 16   Temp: 97.8 F (36.6 C) 98 F (36.7 C)  97.7 F (36.5 C)  TempSrc: Oral Oral  Axillary  SpO2: 98% 97% 99% 100%   No intake or output data in the 24 hours ending 05/12/19 0929 There were no vitals filed for this visit.  Examination:  General exam: Appears calm and comfortable Respiratory system: Clear to auscultation. Respiratory effort normal. Cardiovascular system: S1 & S2 heard, RRR. No murmurs, rubs, gallops or clicks. Gastrointestinal system: Abdomen is nondistended, soft and nontender. No organomegaly or masses felt. Normal bowel sounds heard. Central nervous system: Alert and oriented to person. States she is in a medical hospital, but continues to repeat "medical hospital" for many questions. Extremities: No edema. No calf tenderness Skin: No cyanosis. No rashes Psychiatry: Affect blunt      Data Reviewed: I have personally reviewed following labs and imaging studies  CBC: Recent Labs  Lab 05/07/19 1642  WBC 6.7  NEUTROABS 3.6  HGB 13.1  HCT 40.7  MCV 87.3  PLT 932   Basic Metabolic Panel: Recent Labs  Lab 05/07/19 1642 05/11/19 1152  NA 142 140  K 4.1 4.0  CL 111 107  CO2 21* 23  GLUCOSE 110* 185*  BUN 15 13  CREATININE 0.76 0.78  CALCIUM 9.5 9.3   GFR: CrCl cannot be calculated (Unknown ideal weight.). Liver Function Tests: Recent Labs  Lab 05/07/19 1642  AST 42*  ALT 60*  ALKPHOS 61  BILITOT 0.5  PROT 7.8  ALBUMIN 4.7   No results for input(s): LIPASE, AMYLASE in the last 168 hours. No results for input(s): AMMONIA in the last 168 hours. Coagulation Profile: Recent Labs  Lab 05/07/19 1642  INR 0.9   Cardiac Enzymes: No results for input(s): CKTOTAL, CKMB, CKMBINDEX, TROPONINI  in the last 168 hours. BNP (last 3 results) No results for input(s): PROBNP in the last 8760 hours. HbA1C: No results for input(s): HGBA1C in the last 72 hours. CBG: Recent Labs  Lab 05/11/19 0605 05/11/19 1307 05/11/19 1708 05/11/19 2113 05/12/19 0612  GLUCAP 121* 122* 125* 190* 136*   Lipid Profile: No results for input(s): CHOL, HDL, LDLCALC, TRIG, CHOLHDL, LDLDIRECT in the last 72 hours. Thyroid Function Tests: No results for input(s): TSH, T4TOTAL, FREET4, T3FREE, THYROIDAB in the last 72 hours. Anemia Panel: No results for input(s): VITAMINB12, FOLATE, FERRITIN, TIBC, IRON, RETICCTPCT in the last 72 hours. Sepsis Labs: No results for input(s): PROCALCITON, LATICACIDVEN in the last 168 hours.  Recent Results (from the past 240 hour(s))  Novel Coronavirus,NAA,(SEND-OUT TO REF LAB - TAT 24-48 hrs); Hosp Order     Status: None   Collection Time: 05/07/19  7:34 PM   Specimen: Nasopharyngeal Swab; Respiratory  Result Value Ref Range Status   SARS-CoV-2, NAA NOT DETECTED NOT DETECTED Final    Comment: (NOTE) This test was developed and its performance characteristics determined by [employer]. This test has not been FDA cleared or approved. This test has been authorized by FDA under an Emergency Use Authorization (EUA). This test is only authorized for the duration of time the declaration that circumstances exist justifying the authorization of the emergency use of in vitro diagnostic tests for detection of SARS-CoV-2 virus and/or diagnosis of COVID-19 infection under section 564(b)(1) of the Act, 21 U.S.C. 812XNT-7(G)(0), unless the authorization is terminated or revoked sooner. When diagnostic testing is negative, the possibility of a false negative result should be considered in the context of a patient's recent exposures and the presence of clinical signs and symptoms consistent with COVID-19. An individual without symptoms of COVID-19 and who is not shedding  SARS-CoV-2 virus would expect to have a negative (not detected) result in this assay. Performed  At: [facility] [address] [place], [place] [id number] Rush Farmer MD RF:[id number]    [place]  Final    Comment: Performed at [facility], [address] [place], [place] [address]  SARS Coronavirus 2 (CEPHEID - Performed in [facility] hospital lab), Hosp Order     Status: None   Collection Time: 05/08/19 11:14 AM   Specimen: Nasopharyngeal Swab  Result Value Ref Range Status   SARS Coronavirus 2 NEGATIVE NEGATIVE Final    Comment: (NOTE) If result is NEGATIVE SARS-CoV-2 target nucleic acids are NOT DETECTED. The SARS-CoV-2 RNA is generally detectable in upper and lower  respiratory specimens during the acute phase of infection. The lowest  concentration of SARS-CoV-2 viral copies this assay can detect is 250  copies / mL. A negative result does not preclude SARS-CoV-2 infection  and should not be used as the sole basis for treatment or other  patient management decisions.  A negative result may occur with  improper specimen collection / handling, submission of specimen other  than nasopharyngeal swab, presence of viral mutation(s) within the  areas targeted by this assay, and inadequate number of viral copies  (<250 copies / mL). A negative result must be combined with clinical  observations, patient history, and  epidemiological information. If result is POSITIVE SARS-CoV-2 target nucleic acids are DETECTED. The SARS-CoV-2 RNA is generally detectable in upper and lower  respiratory specimens dur ing the acute phase of infection.  Positive  results are indicative of active infection with SARS-CoV-2.  Clinical  correlation with patient history and other diagnostic information is  necessary to determine patient infection status.  Positive results do  not rule out bacterial infection or co-infection with other viruses. If result is  PRESUMPTIVE POSTIVE SARS-CoV-2 nucleic acids MAY BE PRESENT.   A presumptive positive result was obtained on the submitted specimen  and confirmed on repeat testing.  While 2019 novel coronavirus  (SARS-CoV-2) nucleic acids may be present in the submitted sample  additional confirmatory testing may be necessary for epidemiological  and / or clinical management purposes  to differentiate between  SARS-CoV-2 and other Sarbecovirus currently known to infect humans.  If clinically indicated additional testing with an alternate test  methodology [phone]) is advised. The SARS-CoV-2 RNA is generally  detectable in upper and lower respiratory sp ecimens during the acute  phase of infection. The expected result is Negative. Fact Sheet for Patients:  StrictlyIdeas.no Fact Sheet for Healthcare Providers: BankingDealers.co.za This test is not yet approved or cleared by the [place] FDA and has been authorized for detection and/or diagnosis of SARS-CoV-2 by FDA under an Emergency Use Authorization (EUA).  This EUA will remain in effect (meaning this test can be used) for the duration of the COVID-19 declaration under Section 564(b)(1) of the Act, 21 U.S.C. section 360bbb-3(b)(1), unless the authorization is terminated or revoked sooner. Performed at [facility], [place] [address], [place], [place] [address]          Radiology Studies: Dg Chest 2 View  Result Date: 05/10/2019 CLINICAL DATA:  MRI clearance. Confusion without chest pain. History of stroke. EXAM: CHEST - 2 VIEW COMPARISON:  None. FINDINGS: The lung volumes are somewhat low. There may be a left-sided pleural effusion with adjacent atelectasis. There is no pneumothorax. There is no unexpected metallic foreign body. There are mild degenerative changes throughout the thoracic spine. IMPRESSION: 1. Low lung volumes with probable atelectasis at the left lung base. 2. No unexpected  metallic foreign body. Electronically Signed   By: Constance Holster M.D.   On: 05/10/2019 21:45   Mr Brain Wo Contrast  Result Date: 05/10/2019 CLINICAL DATA:  Initial evaluation for acute aphasia, encephalopathy. EXAM: MRI HEAD WITHOUT CONTRAST TECHNIQUE: Multiplanar, multiecho pulse sequences of the brain and surrounding structures were obtained without intravenous contrast. COMPARISON:  Prior CT from 05/07/2019. FINDINGS: Brain: Cerebral volume within normal limits for age. Chronic encephalomalacia and gliosis involving the anterior left insula and anterior left temporal lobe, stable. Focal encephalomalacia involving the anterior left cingulate also unchanged. No abnormal foci of restricted diffusion to suggest acute or subacute ischemia. Gray-white matter differentiation maintained. No other areas of chronic cortical infarction. No evidence for acute intracranial hemorrhage. No mass lesion, midline shift or mass effect. Ex vacuo dilatation of the temporal horn of the left lateral ventricle related to the left temporal encephalomalacia. No hydrocephalus. No extra-axial fluid collection. Pituitary gland suprasellar region normal. Midline structures intact. Vascular: Major intracranial vascular flow voids are well maintained. Skull and upper cervical spine: Craniocervical junction within normal limits. Upper cervical spine normal. Bone marrow signal intensity within normal limits. No scalp soft tissue abnormality. Sinuses/Orbits: Globes and orbital soft tissues within normal limits. Left maxillary sinus retention cyst noted. Paranasal sinuses are otherwise  largely clear. No significant mastoid effusion. Inner ear structures grossly normal. Other: None. IMPRESSION: 1. No acute intracranial abnormality. 2. 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 3. Otherwise unremarkable brain MRI for age. Electronically Signed   By: Jeannine Boga  M.D.   On: 05/10/2019 22:32        Scheduled Meds:  aspirin EC  81 mg Oral Daily   enoxaparin (LOVENOX) injection  40 mg Subcutaneous Q24H   insulin aspart  0-15 Units Subcutaneous TID WC   insulin aspart  0-5 Units Subcutaneous QHS   levETIRAcetam  1,000 mg Oral BID   levothyroxine  100 mcg Oral Q0600   LORazepam  1 mg Oral Q8H   pravastatin  10 mg Oral q1800   valproic acid  750 mg Oral BID   Continuous Infusions:    LOS: 3 days     Cordelia Poche, MD [facility] 05/12/2019, 9:29 AM  If 7PM-7AM, please contact night-coverage [email or web address]

## 2019-05-12 NOTE — Progress Notes (Signed)
Pt oriented to person and place follows command bu occasionally mixes words which are not understandable. Pt also impulsive and does not call for assist before getting oob which may pose a risk for fall gait fairly unsteady.  Reoriented and instructed  on the use of call bell to call for assist but pt remain forgetful. still forgetful. . Will continue to monitor. Pt

## 2019-05-13 ENCOUNTER — Inpatient Hospital Stay (HOSPITAL_COMMUNITY): Payer: Medicare Other | Admitting: Occupational Therapy

## 2019-05-13 ENCOUNTER — Inpatient Hospital Stay (HOSPITAL_COMMUNITY): Payer: Medicare Other | Admitting: Physical Therapy

## 2019-05-13 ENCOUNTER — Inpatient Hospital Stay (HOSPITAL_COMMUNITY): Payer: Medicare Other | Admitting: Speech Pathology

## 2019-05-13 DIAGNOSIS — M545 Low back pain: Secondary | ICD-10-CM

## 2019-05-13 DIAGNOSIS — G8929 Other chronic pain: Secondary | ICD-10-CM

## 2019-05-13 DIAGNOSIS — R569 Unspecified convulsions: Secondary | ICD-10-CM

## 2019-05-13 DIAGNOSIS — R7401 Elevation of levels of liver transaminase levels: Secondary | ICD-10-CM

## 2019-05-13 DIAGNOSIS — R740 Nonspecific elevation of levels of transaminase and lactic acid dehydrogenase [LDH]: Secondary | ICD-10-CM

## 2019-05-13 LAB — CBC WITH DIFFERENTIAL/PLATELET
Abs Immature Granulocytes: 0.01 10*3/uL (ref 0.00–0.07)
Basophils Absolute: 0 10*3/uL (ref 0.0–0.1)
Basophils Relative: 1 %
Eosinophils Absolute: 0.2 10*3/uL (ref 0.0–0.5)
Eosinophils Relative: 3 %
HCT: 40.9 % (ref 36.0–46.0)
Hemoglobin: 13.3 g/dL (ref 12.0–15.0)
Immature Granulocytes: 0 %
Lymphocytes Relative: 34 %
Lymphs Abs: 2 10*3/uL (ref 0.7–4.0)
MCH: 28.1 pg (ref 26.0–34.0)
MCHC: 32.5 g/dL (ref 30.0–36.0)
MCV: 86.5 fL (ref 80.0–100.0)
Monocytes Absolute: 0.8 10*3/uL (ref 0.1–1.0)
Monocytes Relative: 13 %
Neutro Abs: 3 10*3/uL (ref 1.7–7.7)
Neutrophils Relative %: 49 %
Platelets: 238 10*3/uL (ref 150–400)
RBC: 4.73 MIL/uL (ref 3.87–5.11)
RDW: 14.2 % (ref 11.5–15.5)
WBC: 6 10*3/uL (ref 4.0–10.5)
nRBC: 0 % (ref 0.0–0.2)

## 2019-05-13 LAB — GLUCOSE, CAPILLARY
Glucose-Capillary: 133 mg/dL — ABNORMAL HIGH (ref 70–99)
Glucose-Capillary: 127 mg/dL — ABNORMAL HIGH (ref 70–99)
Glucose-Capillary: 116 mg/dL — ABNORMAL HIGH (ref 70–99)
Glucose-Capillary: 203 mg/dL — ABNORMAL HIGH (ref 70–99)

## 2019-05-13 LAB — COMPREHENSIVE METABOLIC PANEL
ALT: 46 U/L — ABNORMAL HIGH (ref 0–44)
AST: 31 U/L (ref 15–41)
Albumin: 3.5 g/dL (ref 3.5–5.0)
Alkaline Phosphatase: 60 U/L (ref 38–126)
Anion gap: 12 (ref 5–15)
BUN: 21 mg/dL (ref 8–23)
CO2: 24 mmol/L (ref 22–32)
Calcium: 9.6 mg/dL (ref 8.9–10.3)
Chloride: 104 mmol/L (ref 98–111)
Creatinine, Ser: 0.92 mg/dL (ref 0.44–1.00)
GFR calc Af Amer: >60 mL/min (ref >60)
GFR calc non Af Amer: >60 mL/min (ref >60)
Glucose, Bld: 146 mg/dL — ABNORMAL HIGH (ref 70–99)
Potassium: 4.3 mmol/L (ref 3.5–5.1)
Sodium: 140 mmol/L (ref 135–145)
Total Bilirubin: 0.6 mg/dL (ref 0.3–1.2)
Total Protein: 6.7 g/dL (ref 6.5–8.1)

## 2019-05-13 NOTE — Progress Notes (Signed)
Inpatient Rehabilitation  Patient information reviewed and entered into eRehab system by Sueo Cullen M. Daijah Scrivens, M.A., CCC/SLP, PPS Coordinator.  Information including medical coding, functional ability and quality indicators will be reviewed and updated through discharge.    
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
## 2019-05-13 NOTE — Progress Notes (Signed)
[place] PHYSICAL MEDICINE & REHABILITATION PROGRESS NOTE  Subjective/Complaints: Patient seen standing up working with therapies this morning.  Appears to have slept well overnight.  Slightly limited due to language.  ROS: Limited due to language and cognition.  Objective: Vital Signs: Blood pressure 121/70, pulse 80, temperature 97.9 F (36.6 C), temperature source Oral, resp. rate 17, height 5\' 4"  (1.626 m), weight 78.2 kg, SpO2 93 %. No results found. Recent Labs    05/13/19 0536  WBC 6.0  HGB 13.3  HCT 40.9  PLT 238   Recent Labs    05/11/19 1152 05/13/19 0536  NA 140 140  K 4.0 4.3  CL 107 104  CO2 23 24  GLUCOSE 185* 146*  BUN 13 21  CREATININE 0.78 0.92  CALCIUM 9.3 9.6    Physical Exam: BP 121/70 (BP Location: Right Arm)   Pulse 80   Temp 97.9 F (36.6 C) (Oral)   Resp 17   Ht 5\' 4"  (1.626 m)   Wt 78.2 kg   SpO2 93%   BMI 29.59 kg/m  Constitutional: No distress . Vital signs reviewed. HENT: Normocephalic.  Atraumatic. Eyes: EOMI. No discharge. Cardiovascular: No JVD. Respiratory: Normal effort. GI: Non-distended. Musc: No edema or tenderness in extremities. Neurologic:  Alert Motor: >/3/5 throughout  Psych: Confused and perseverative at times  Assessment/Plan: 1. 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  Physiatrist is providing close team supervision and 24 hour management of active medical problems listed below.  Physiatrist and rehab team continue to assess barriers to discharge/monitor patient progress toward functional and medical goals  Care Tool:  Bathing              Bathing assist       Upper Body Dressing/Undressing Upper body dressing        Upper body assist      Lower Body Dressing/Undressing Lower body dressing            Lower body assist       Toileting Toileting    Toileting assist  Assist for toileting: Moderate Assistance - Patient 50 - 74%     Transfers Chair/bed transfer  Transfers assist           Locomotion Ambulation   Ambulation assist              Walk 10 feet activity   Assist           Walk 50 feet activity   Assist           Walk 150 feet activity   Assist           Walk 10 feet on uneven surface  activity   Assist           Wheelchair     Assist               Wheelchair 50 feet with 2 turns activity    Assist            Wheelchair 150 feet activity     Assist            Medical Problem List and Plan: 1. Decline in functional mobility and ADLs secondary to TBI and seizures  Begin CIR evaluations  Notes reviewed- mild TBI with subsequent seizures, labs personally reviewed 2. Antithrombotics: -DVT/anticoagulation:Pharmaceutical:Lovenox -antiplatelet therapy: N/A 3.Chronic back pain/Pain Management:Tylenol as needed  Monitor with increased  mobility 4. Mood:LCSW tofollow for evaluation and support when appropriate. -antipsychotic agents: N/A 5. Neuropsych: This patient is notcapable of making decisions on herown behalf. 6. Skin/Wound Care:Routine pressure relief measures. 7. Fluids/Electrolytes/Nutrition:Monitor I/Os.   BMP within acceptable range on 7/10 8.T2DM: Hemoglobin A1c 7.1. Monitor blood sugars AC at bedtime. Resumed metformin at 500 mg bid ac.   Monitor with increased mobility 9.Seizures: Continue Keppra twice daily, Depakote TID.  Plan to wean Ativan 10.  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  Mild transaminitis  ALT elevated on 7/10, labs ordered for Monday to ensure is not trending up on antiepileptics  LOS: 1 days A FACE TO FACE EVALUATION WAS PERFORMED  Oluwaferanmi Wain Lorie Phenix 05/13/2019, 11:42 AM

## 2019-05-13 NOTE — Progress Notes (Signed)
Social Work Assessment and Plan   Patient Details  Name: Cathy Harrington MRN: [id number] Date of Birth: 02-03-1946  Today's Date: 05/13/2019  Problem List:  Patient Active Problem List   Diagnosis Date Noted  . Diabetes mellitus type 2 in nonobese (HCC)   . Transaminitis   . Chronic bilateral low back pain without sciatica   . TBI (traumatic brain injury) ([place]) 05/12/2019  . Aphasia 05/08/2019  . Diabetes mellitus type 2 in obese ([place]) 05/08/2019  . Hypothyroidism (acquired) 05/08/2019  . Altered mental status   . Pyrexia   . Seizure ([place])   . Encephalitis   . Seizures ([place]) 04/24/2016  . Acute respiratory failure ([place]) 04/24/2016   Past Medical History:  Past Medical History:  Diagnosis Date  . Diabetes mellitus without complication ([place])   . 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 Hypothyroidism   . Thyroid disease    Past Surgical History:  Past Surgical History:  Procedure Laterality Date  . BACK SURGERY  2000  . BREAST SURGERY    . COLONOSCOPY    . COLONOSCOPY WITH PROPOFOL N/A 01/19/2018   Procedure: COLONOSCOPY WITH PROPOFOL;  Surgeon: Lollie Sails, MD;  Location: [facility] ENDOSCOPY;  Service: Endoscopy;  Laterality: N/A;  . parathyroidectomy     Social History:  reports that she has quit smoking. She has never used smokeless tobacco. She reports that she does not drink alcohol or use drugs.  Family / Support Systems Marital Status: Married Patient Roles: Spouse, Parent Spouse/Significant Other: spouse, Cathy Harrington @ [phone] Children: son, Cathy Harrington [facility]) @ [phone];  daughter, Cathy Harrington (living in the home) Anticipated Caregiver: spouse Ability/Limitations of Caregiver: no limitations Caregiver Availability: 24/7  Social History Preferred language: English Religion: None Cultural Background: Pt born in [place] with native  language, Arabic.   Abuse/Neglect Abuse/Neglect Assessment Can Be Completed: Yes Physical Abuse: Denies Verbal Abuse: Denies Sexual Abuse: Denies Exploitation of patient/patient's resources: Denies Self-Neglect: Denies  Emotional Status Pt's affect, behavior and adjustment status: Pt very pleasant but does struggle with some expressive speech deficits and has difficulty with language.  Spouse present and assists as needed.  Pt and spouse talk primarily about the stress of their homelife with their adult daughter.  Allowed them to talk about this.  Pt concerned about the how this stress affects her health and recovery. Recent Psychosocial Issues: As noted, both pt and spouse report their home is "very stressful" due to behavioral issues with adult daughter. Psychiatric History: None Substance Abuse History: None  Patient / Family Perceptions, Expectations & Goals Pt/Family understanding of illness & functional limitations: Pt and spouse wtih good, general understanding of her TBI/ seizure and current functional deficite/ need for CIR. Premorbid pt/family roles/activities: Pt was completely independent Anticipated changes in roles/activities/participation: Little change anticipated if pt able to reach supervision goals. Spouse does provide support. Pt/family expectations/goals: "Can you give [place] ideas about how to manage our daughter?"  [employer]: None Premorbid Home Care/DME Agencies: None Transportation available at discharge: yes  Discharge Planning Living Arrangements: Spouse/significant other, Children Support Systems: Spouse/significant other, Children Type of Residence: Private residence Insurance Resources: Chartered certified accountant Resources: [place] Referred: No Living Expenses: Own Money Management: Spouse Does the patient have any problems obtaining your medications?: No Home Management: Pt and spouse share  responsibilities Patient/Family Preliminary Plans:  Pt to return home with spouse as primary caregiver. Social Work Anticipated Follow Up Needs: HH/OP Expected length of stay: 5-7 days  Clinical Impression Pleasant woman here on CIR following TBI/ seizure activity.  Spouse at bedside and very supportive as pt does have some speech deficits.  Both very focused on talking about the stressors in the home with their adult daughter.  They are getting help from their son in addressing these issues.  No concerns about spouse being able to assist as needed at home.  Team anticipating short LOS.  Cathy Harrington 05/13/2019, 4:52 PM
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
## 2019-05-14 ENCOUNTER — Inpatient Hospital Stay (HOSPITAL_COMMUNITY): Payer: Medicare Other | Admitting: Occupational Therapy

## 2019-05-14 ENCOUNTER — Inpatient Hospital Stay (HOSPITAL_COMMUNITY): Payer: Medicare Other | Admitting: Physical Therapy

## 2019-05-14 ENCOUNTER — Inpatient Hospital Stay (HOSPITAL_COMMUNITY): Payer: Medicare Other | Admitting: Speech Pathology

## 2019-05-14 DIAGNOSIS — S069X0S Unspecified intracranial injury without loss of consciousness, sequela: Secondary | ICD-10-CM

## 2019-05-14 DIAGNOSIS — E119 Type 2 diabetes mellitus without complications: Secondary | ICD-10-CM

## 2019-05-14 LAB — GLUCOSE, CAPILLARY
Glucose-Capillary: 118 mg/dL — ABNORMAL HIGH (ref 70–99)
Glucose-Capillary: 152 mg/dL — ABNORMAL HIGH (ref 70–99)
Glucose-Capillary: 130 mg/dL — ABNORMAL HIGH (ref 70–99)
Glucose-Capillary: 203 mg/dL — ABNORMAL HIGH (ref 70–99)

## 2019-05-14 NOTE — Progress Notes (Addendum)
Physical Therapy Session Note  Patient Details  Name: Cathy Harrington MRN: [id number] Date of Birth: 11/25/1945  Today's Date: 05/14/2019 PT Individual Time: 1341-1441 PT Individual Time Calculation (min): 60 min   Short Term Goals: Week 1:  PT Short Term Goal 1 (Week 1): STG = LTG due to short ELOS.  Skilled Therapeutic Interventions/Progress Updates:  Pt received in bed with husband present to observe session. Pt transfers to sitting EOB with supervision and sit<>stand with CGA fade to supervision. Pt ambulates room>dayroom>gym>room without AD & CGA with max cuing to not hold to rail & pt easily distracted with decreased weight shifting L. Pt utilized kinetron in sitting then standing with BUE & task focusing on RLE NMR & strengthening, dynamic balance, & weight shifting. Pt completes Berg Balance Test & scores 37/56; educated pt & husband on interpretation of score & current fall risk. Patient demonstrates increased fall risk as noted by score of 37/56 on Berg Balance Scale.  (<36= high risk for falls, close to 100%; 37-45 significant >80%; 46-51 moderate >50%; 52-55 lower >25%). Educated [place] on home modifications (remove/secure throw rugs) to reduce tripping hazards for pt at home. Pt performed standing cone taps with RUE then no UE support with mod assist for balance with task focusing on weight shifting L<>R, R NMR, & dynamic balance. Pt performed lateral step ups on 3" step without BUE support with task focusing on dynamic balance & RLE strengthening (hip flexion & abduction) & NMR with therapist providing min assist for balance. Pt transitioned to supine on mat table & performed BLE bridging & RLE single leg bridges with therapist providing assistance to maintain RLE neutral alignment & max multimodal cuing for technique and encouragement for increased buttocks clearance from mat table. Pt requires max cuing for pathfinding back to room with use of signs in hallway. Pt left in bed with alarm set  & needs at hand.    Pt with great difficulty following commands on this date 2/2 expressive & receptive language deficits; pt does much better when husband interprets in Arabic, which is what pt speaks on a regular basis at home.   Therapy Documentation Precautions:  Precautions Precautions: Fall Precaution Comments: receptive & expressive language deficits Restrictions Weight Bearing Restrictions: No  Pain: No c/o pain reported.   Balance: Balance Balance Assessed: Yes Standardized Balance Assessment Standardized Balance Assessment: Berg Balance Test Berg Balance Test Sit to Stand: Able to stand without using hands and stabilize independently Standing Unsupported: Able to stand 2 minutes with supervision Sitting with Back Unsupported but Feet Supported on Floor or Stool: Able to sit safely and securely 2 minutes Stand to Sit: Sits safely with minimal use of hands Transfers: Able to transfer safely, definite need of hands Standing Unsupported with Eyes Closed: Able to stand 10 seconds with supervision Standing Ubsupported with Feet Together: Able to place feet together independently but unable to hold for 30 seconds From Standing, Reach Forward with Outstretched Arm: Reaches forward but needs supervision(great difficulty following instructions to complete task) From Standing Position, Pick up Object from Floor: Able to pick up shoe, needs supervision From Standing Position, Turn to Look Behind Over each Shoulder: Turn sideways only but maintains balance Turn 360 Degrees: Able to turn 360 degrees safely but slowly Standing Unsupported, Alternately Place Feet on Step/Stool: Able to complete 4 steps without aid or supervision Standing Unsupported, One Foot in Front: Able to plae foot ahead of the other independently and hold 30 seconds Standing on One Leg:  Tries to lift leg/unable to hold 3 seconds but remains standing independently Total Score: 37     Therapy/Group: Individual  Therapy  Cathy Harrington 05/14/2019, 2:48 PM

## 2019-05-14 NOTE — Progress Notes (Signed)
[facility] PHYSICAL MEDICINE & REHABILITATION PROGRESS NOTE  Subjective/Complaints: Patient seen sitting up in bed this morning.  She states she slept well overnight.  She states that a good first day of therapies yesterday.  She states she wants to go home.  ROS: Limited due to language, but appears to deny CP, shortness of breath, nausea, vomiting, diarrhea.  Objective: Vital Signs: Blood pressure 99/60, pulse 76, temperature 98.5 F (36.9 C), temperature source Oral, resp. rate 18, height 5\' 4"  (1.626 m), weight 78.2 kg, SpO2 98 %. No results found. Recent Labs    05/13/19 0536  WBC 6.0  HGB 13.3  HCT 40.9  PLT 238   Recent Labs    05/13/19 0536  NA 140  K 4.3  CL 104  CO2 24  GLUCOSE 146*  BUN 21  CREATININE 0.92  CALCIUM 9.6    Physical Exam: BP 99/60 (BP Location: Right Arm)   Pulse 76   Temp 98.5 F (36.9 C) (Oral)   Resp 18   Ht 5\' 4"  (1.626 m)   Wt 78.2 kg   SpO2 98%   BMI 29.59 kg/m  Constitutional: No distress . Vital signs reviewed. HENT: Normocephalic.  Atraumatic. Eyes: EOMI.  No discharge. Cardiovascular: No JVD. Respiratory: Normal effort. GI: Non-distended. Musc: No edema or tenderness in extremities. Neurologic:  Alert Motor: Grossly 4-4+/5 throughout Psych: Slightly confused  Assessment/Plan: 1. 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  Physiatrist is providing close team supervision and 24 hour management of active medical problems listed below.  Physiatrist and rehab team continue to assess barriers to discharge/monitor patient progress toward functional and medical goals  Care Tool:  Bathing    Body parts bathed by patient: Right arm, Left arm, Chest, Abdomen, Front perineal area, Buttocks, Right upper leg, Left upper leg, Face   Body parts bathed by helper: Right lower leg, Left lower leg, Buttocks     Bathing  assist Assist Level: Minimal Assistance - Patient > 75%     Upper Body Dressing/Undressing Upper body dressing   What is the patient wearing?: Bra, Pull over shirt    Upper body assist Assist Level: Minimal Assistance - Patient > 75%    Lower Body Dressing/Undressing Lower body dressing      What is the patient wearing?: Underwear/pull up, Pants     Lower body assist Assist for lower body dressing: Moderate Assistance - Patient 50 - 74%     Toileting Toileting    Toileting assist Assist for toileting: Contact Guard/Touching assist     Transfers Chair/bed transfer  Transfers assist     Chair/bed transfer assist level: Contact Guard/Touching assist     Locomotion Ambulation   Ambulation assist      Assist level: Contact Guard/Touching assist Assistive device: No Device Max distance: 140 ft   Walk 10 feet activity   Assist     Assist level: Contact Guard/Touching assist Assistive device: No Device   Walk 50 feet activity   Assist    Assist level: Contact Guard/Touching assist Assistive device: No Device    Walk 150 feet activity   Assist Walk 150 feet activity did not occur: Safety/medical concerns         Walk 10 feet on uneven surface  activity   Assist     Assist level: Minimal Assistance - Patient > 75%     Wheelchair  Assist Will patient use wheelchair at discharge?: No Type of Wheelchair: Manual    Wheelchair assist level: Supervision/Verbal cueing Max wheelchair distance: 50 ft    Wheelchair 50 feet with 2 turns activity    Assist    Wheelchair 50 feet with 2 turns activity did not occur: Safety/medical concerns       Wheelchair 150 feet activity     Assist Wheelchair 150 feet activity did not occur: Safety/medical concerns          Medical Problem List and Plan: 1. Decline in functional mobility and ADLs secondary to TBI and seizures  Continue CIR 2.  Antithrombotics: -DVT/anticoagulation:Pharmaceutical:Lovenox -antiplatelet therapy: N/A 3.Chronic back pain/Pain Management:Tylenol as needed  Controlled on 7/11  Monitor with increased mobility 4. Mood:LCSW tofollow for evaluation and support when appropriate. -antipsychotic agents: N/A 5. Neuropsych: This patient is notcapable of making decisions on herown behalf. 6. Skin/Wound Care:Routine pressure relief measures. 7. Fluids/Electrolytes/Nutrition:Monitor I/Os.   BMP within acceptable range on 7/10 8.T2DM: Hemoglobin A1c 7.1. Monitor blood sugars AC at bedtime. Resumed metformin at 500 mg bid ac.   Labile on 7/11, monitor for trend  Monitor with increased mobility 9.Seizures: Continue Keppra twice daily, Depakote TID.  Ativan DC'd  Plan to check Depakote level next week 10.  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  Mild transaminitis  ALT elevated on 7/10, labs ordered for Monday to ensure is not trending up on antiepileptics  LOS: 2 days A FACE TO FACE EVALUATION WAS PERFORMED  Ankit Lorie Phenix 05/14/2019, 11:52 AM

## 2019-05-14 NOTE — Plan of Care (Signed)
  Problem: Consults Goal: RH BRAIN INJURY PATIENT EDUCATION Description: Description: See Patient Education module for eduction specifics Outcome: Progressing Goal: Skin Care Protocol Initiated - if Braden Score 18 or less Description: If consults are not indicated, leave blank or document N/A Outcome: Progressing Goal: Diabetes Guidelines if Diabetic/Glucose > 140 Description: If diabetic or lab glucose is > 140 mg/dl - Initiate Diabetes/Hyperglycemia Guidelines & Document Interventions  Outcome: Progressing   Problem: RH BOWEL ELIMINATION Goal: RH STG MANAGE BOWEL WITH ASSISTANCE Description: STG Manage Bowel with min Assistance. Outcome: Progressing Goal: RH STG MANAGE BOWEL W/MEDICATION W/ASSISTANCE Description: STG Manage Bowel with Medication with min Assistance. Outcome: Progressing   Problem: RH BLADDER ELIMINATION Goal: RH STG MANAGE BLADDER WITH ASSISTANCE Description: STG Manage Bladder With min Assistance Outcome: Progressing Goal: RH STG MANAGE BLADDER WITH MEDICATION WITH ASSISTANCE Description: STG Manage Bladder With Medication With min Assistance. Outcome: Progressing Goal: RH STG MANAGE BLADDER WITH EQUIPMENT WITH ASSISTANCE Description: STG Manage Bladder With Equipment With min Assistance Outcome: Progressing   Problem: RH SKIN INTEGRITY Goal: RH STG SKIN FREE OF INFECTION/BREAKDOWN Description: Skin will be free of infection/breakdown with min assist Outcome: Progressing Goal: RH STG MAINTAIN SKIN INTEGRITY WITH ASSISTANCE Description: STG Maintain Skin Integrity With min Assistance. Outcome: Progressing   Problem: RH SAFETY Goal: RH STG ADHERE TO SAFETY PRECAUTIONS W/ASSISTANCE/DEVICE Description: STG Adhere to Safety Precautions With min Assistance/Device. Outcome: Progressing Goal: RH STG DECREASED RISK OF FALL WITH ASSISTANCE Description: STG Decreased Risk of Fall With min Assistance. Outcome: Progressing   Problem: RH PAIN MANAGEMENT Goal: RH  STG PAIN MANAGED AT OR BELOW PT'S PAIN GOAL Description: Less than 3 out of 10 Outcome: Progressing   Problem: RH KNOWLEDGE DEFICIT BRAIN INJURY Goal: RH STG INCREASE KNOWLEDGE OF SELF CARE AFTER BRAIN INJURY Description: Pt will be able to verbalize safety precautions in home Outcome: Progressing

## 2019-05-14 NOTE — Progress Notes (Signed)
Occupational Therapy Session Note  Patient Details  Name: Cathy Harrington MRN: [id number] Date of Birth: 07-18-1946  Today's Date: 05/14/2019 OT Individual Time: 1103-1203 OT Individual Time Calculation (min): 60 min   Short Term Goals: Week 1:  OT Short Term Goal 1 (Week 1): STG = LTG  Skilled Therapeutic Interventions/Progress Updates:    Pt greeted in bed with min c/o lower back pain, however declining any interventions to address. She wanted to shower. Pt completed toileting (sit<stand from low toilet), bathing (sit<stand from TTB), dressing (sit<stand from low toilet), and oral care/grooming tasks (standing at sink) during session. All functional transfers completed at ambulatory level without AD and steady assist. Initially tried to have pt use walker, however she stood far away from it and unable to improve technique with verbal and demonstrational cues. Steady assist-close supervision for dynamic standing balance in shower and also when dressing. Able to use R UE functionally to wash herself, fasten bra, and button pants. Pt donned bra in standing, clipping in front, and rotating bra around trunk with steady assist. Steady assist for toileting tasks also, with pt having continent bladder void during tx. Pt used R UE at dominant level while brushing her teeth. Throughout session, pt conversational and able to convey most of needs, though is still limited by expressive aphasia. At end of session pt returned to w/c and was left with spouse and chair alarm set.       Therapy Documentation Precautions:  Precautions Precautions: Fall Precaution Comments: receptive & expressive language deficits Restrictions Weight Bearing Restrictions: No Vital Signs: Therapy Vitals Temp: 97.6 F (36.4 C) Temp Source: Oral Pulse Rate: 88 Resp: 19 BP: 119/77 Patient Position (if appropriate): Lying Oxygen Therapy SpO2: 97 % O2 Device: Room Air ADL: ADL Eating: Set up Where Assessed-Eating: Edge of  bed Grooming: Minimal assistance, Minimal cueing Where Assessed-Grooming: Sitting at sink Upper Body Bathing: Supervision/safety Where Assessed-Upper Body Bathing: Shower Lower Body Bathing: Moderate assistance, Minimal cueing Where Assessed-Lower Body Bathing: Shower Upper Body Dressing: Minimal assistance Where Assessed-Upper Body Dressing: Standing at sink Lower Body Dressing: Moderate assistance Where Assessed-Lower Body Dressing: Wheelchair Toileting: Moderate assistance Where Assessed-Toileting: Glass blower/designer: Psychiatric nurse Method: Arts development officer: Energy manager: Minimal assistance, Minimal cueing Social research officer, government Method: Heritage manager: Radio broadcast assistant ADL Comments: cues for safety      Therapy/Group: Individual Therapy  Chrisotpher Rivero A Antoni Stefan 05/14/2019, 5:25 PM

## 2019-05-14 NOTE — Progress Notes (Signed)
Speech Language Pathology Daily Session Note  Patient Details  Name: Cathy Harrington MRN: [id number] Date of Birth: [birth year]  Today's Date: 05/14/2019 SLP Individual Time: 0826-0926 SLP Individual Time Calculation (min): 60 min  Short Term Goals: Week 1: SLP Short Term Goal 1 (Week 1): Pt will answer simple yes/no questions accurately with Mod A verbal/visual cues SLP Short Term Goal 2 (Week 1): Pt will follow 1-step commands accurately with Mod A verbal/visual cues SLP Short Term Goal 3 (Week 1): Pt will express basic wants and needs at the phrase level with Mod A multimodal cues SLP Short Term Goal 4 (Week 1): Pt will name basic familiar objects with 75% accuracy given Mod A multimodal cues  Skilled Therapeutic Interventions: Patient received skilled SLP services to address aphasia. Patient named items when provided binary options with 60% accuracy. Patient followed basic 1 step directions with 75% accuracy following mod verbal and visual cue. Patient named family members in pictures on her smart phone with max verbal cues. Patient responded to biographical yes/no questions with 70% accuracy when provided mod verbal cues. Patient's husband was present at the end of therapy session and education was provided about current ST goals. At the end of therapy session patient was upright in bed, bed alarm activated, and all needs within reach.  Pain Pain Assessment Pain Scale: 0-10 Pain Score: 0-No pain  Therapy/Group: Individual Therapy  Cristy Folks 05/14/2019, 10:13 AM

## 2019-05-15 ENCOUNTER — Inpatient Hospital Stay (HOSPITAL_COMMUNITY): Payer: Medicare Other

## 2019-05-15 LAB — GLUCOSE, CAPILLARY
Glucose-Capillary: 118 mg/dL — ABNORMAL HIGH (ref 70–99)
Glucose-Capillary: 161 mg/dL — ABNORMAL HIGH (ref 70–99)
Glucose-Capillary: 89 mg/dL (ref 70–99)
Glucose-Capillary: 138 mg/dL — ABNORMAL HIGH (ref 70–99)

## 2019-05-15 NOTE — Plan of Care (Signed)
  Problem: Consults Goal: RH BRAIN INJURY PATIENT EDUCATION Description: Description: See Patient Education module for eduction specifics Outcome: Progressing Goal: Skin Care Protocol Initiated - if Braden Score 18 or less Description: If consults are not indicated, leave blank or document N/A Outcome: Progressing Goal: Diabetes Guidelines if Diabetic/Glucose > 140 Description: If diabetic or lab glucose is > 140 mg/dl - Initiate Diabetes/Hyperglycemia Guidelines & Document Interventions  Outcome: Progressing   Problem: RH BOWEL ELIMINATION Goal: RH STG MANAGE BOWEL WITH ASSISTANCE Description: STG Manage Bowel with min Assistance. Outcome: Progressing Goal: RH STG MANAGE BOWEL W/MEDICATION W/ASSISTANCE Description: STG Manage Bowel with Medication with min Assistance. Outcome: Progressing   Problem: RH BLADDER ELIMINATION Goal: RH STG MANAGE BLADDER WITH ASSISTANCE Description: STG Manage Bladder With min Assistance Outcome: Progressing Goal: RH STG MANAGE BLADDER WITH MEDICATION WITH ASSISTANCE Description: STG Manage Bladder With Medication With min Assistance. Outcome: Progressing Goal: RH STG MANAGE BLADDER WITH EQUIPMENT WITH ASSISTANCE Description: STG Manage Bladder With Equipment With min Assistance Outcome: Progressing   Problem: RH SKIN INTEGRITY Goal: RH STG SKIN FREE OF INFECTION/BREAKDOWN Description: Skin will be free of infection/breakdown with min assist Outcome: Progressing Goal: RH STG MAINTAIN SKIN INTEGRITY WITH ASSISTANCE Description: STG Maintain Skin Integrity With min Assistance. Outcome: Progressing   Problem: RH SAFETY Goal: RH STG ADHERE TO SAFETY PRECAUTIONS W/ASSISTANCE/DEVICE Description: STG Adhere to Safety Precautions With min Assistance/Device. Outcome: Progressing Goal: RH STG DECREASED RISK OF FALL WITH ASSISTANCE Description: STG Decreased Risk of Fall With min Assistance. Outcome: Progressing   Problem: RH PAIN MANAGEMENT Goal: RH  STG PAIN MANAGED AT OR BELOW PT'S PAIN GOAL Description: Less than 3 out of 10 Outcome: Progressing   Problem: RH KNOWLEDGE DEFICIT BRAIN INJURY Goal: RH STG INCREASE KNOWLEDGE OF SELF CARE AFTER BRAIN INJURY Description: Pt will be able to verbalize safety precautions in home Outcome: Progressing

## 2019-05-15 NOTE — Progress Notes (Signed)
Occupational Therapy Session Note  Patient Details  Name: Cathy Harrington MRN: [id number] Date of Birth: Oct 31, 1946  Today's Date: 05/15/2019 OT Individual Time: 1100-1200 OT Individual Time Calculation (min): 60 min    Short Term Goals: Week 1:  OT Short Term Goal 1 (Week 1): STG = LTG  Skilled Therapeutic Interventions/Progress Updates:    Pt received supine with no c/o pain. Pt struggling with expressive aphasia throughout session, often getting frustrated and requring assistance in word finding. Pt completed ambulatory transfer to toilet with CGA, moderate postural sway. Pt doffed clothing on toilet with (S). Pt transferred into shower with CGA. Pt washed UB/LB with (S). Cueing required to wash LB seated. Pt returned to EOB and dryed off. Pt donned UB clothing with (S). CGA for pt to don LB clothing, with cueing required to complete this seated for fall risk prevention. Pt completed 100 ft of functional mobility with close (S). Pt completed Dyanvision x2 trials with an average reaction time of 2.07 seconds. Pt completed 200 ft of functional mobility to therapy gym. She stood on non-compliant surface and completed BUE reaching task to challenge dynamic standing balance for several trials, each task graded to increase balance demands. Pt returned to her room, another 200 ft of functional mobility with close (S). Pt was left supine in bed with all needs met, bed alarm set.   Therapy Documentation Precautions:  Precautions Precautions: Fall Precaution Comments: receptive & expressive language deficits Restrictions Weight Bearing Restrictions: No   Therapy/Group: Individual Therapy  Curtis Sites 05/15/2019, 11:44 AM

## 2019-05-15 NOTE — Progress Notes (Signed)
[place] PHYSICAL MEDICINE & REHABILITATION PROGRESS NOTE  Subjective/Complaints: Patient seen sitting up in bed this morning.  She states she slept well overnight.  She states she feels much better.  She notes significant improvement in her speech.  ROS: Slightly limited due to language/cognition, but appears to deny CP, shortness of breath, nausea, vomiting, diarrhea.  Objective: Vital Signs: Blood pressure 131/71, pulse 82, temperature 97.7 F (36.5 C), temperature source Oral, resp. rate 17, height 5\' 4"  (1.626 m), weight 78.2 kg, SpO2 94 %. No results found. Recent Labs    05/13/19 0536  WBC 6.0  HGB 13.3  HCT 40.9  PLT 238   Recent Labs    05/13/19 0536  NA 140  K 4.3  CL 104  CO2 24  GLUCOSE 146*  BUN 21  CREATININE 0.92  CALCIUM 9.6    Physical Exam: BP 131/71 (BP Location: Left Arm)   Pulse 82   Temp 97.7 F (36.5 C) (Oral)   Resp 17   Ht 5\' 4"  (1.626 m)   Wt 78.2 kg   SpO2 94%   BMI 29.59 kg/m  Constitutional: No distress . Vital signs reviewed. HENT: Normocephalic.  Atraumatic. Eyes: EOMI.  No discharge. Cardiovascular: No JVD. Respiratory: Normal effort. GI: Non-distended. Musc: No edema or tenderness in extremities. Neurologic:  Alert Expressive deficit improving Motor: Grossly 4+/5 throughout Psych: Slightly confused, improving  Assessment/Plan: 1. 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  Physiatrist is providing close team supervision and 24 hour management of active medical problems listed below.  Physiatrist and rehab team continue to assess barriers to discharge/monitor patient progress toward functional and medical goals  Care Tool:  Bathing    Body parts bathed by patient: Right arm, Left arm, Chest, Abdomen, Front perineal area, Buttocks, Right upper leg, Left upper leg, Face, Right lower leg, Left lower leg   Body parts bathed by helper: Right lower leg, Left lower leg, Buttocks     Bathing assist Assist Level: Contact Guard/Touching assist     Upper Body Dressing/Undressing Upper body dressing   What is the patient wearing?: Bra, Pull over shirt    Upper body assist Assist Level: Contact Guard/Touching assist(in standing)    Lower Body Dressing/Undressing Lower body dressing      What is the patient wearing?: Underwear/pull up, Pants     Lower body assist Assist for lower body dressing: Supervision/Verbal cueing     Toileting Toileting    Toileting assist Assist for toileting: Supervision/Verbal cueing     Transfers Chair/bed transfer  Transfers assist     Chair/bed transfer assist level: Supervision/Verbal cueing     Locomotion Ambulation   Ambulation assist      Assist level: Contact Guard/Touching assist Assistive device: No Device Max distance: 150 ft   Walk 10 feet activity   Assist     Assist level: Contact Guard/Touching assist Assistive device: No Device   Walk 50 feet activity   Assist    Assist level: Contact Guard/Touching assist Assistive device: No Device    Walk 150 feet activity   Assist Walk 150 feet activity did not occur: Safety/medical concerns  Assist level: Contact Guard/Touching assist Assistive device: No Device    Walk 10 feet on uneven surface  activity   Assist     Assist level: Minimal Assistance - Patient > 75%     Wheelchair     Assist  Will patient use wheelchair at discharge?: No Type of Wheelchair: Manual    Wheelchair assist level: Supervision/Verbal cueing Max wheelchair distance: 50 ft    Wheelchair 50 feet with 2 turns activity    Assist    Wheelchair 50 feet with 2 turns activity did not occur: Safety/medical concerns       Wheelchair 150 feet activity     Assist Wheelchair 150 feet activity did not occur: Safety/medical concerns          Medical Problem List and  Plan: 1. Decline in functional mobility and ADLs secondary to TBI and seizures  Continue CIR 2. Antithrombotics: -DVT/anticoagulation:Pharmaceutical:Lovenox -antiplatelet therapy: N/A 3.Chronic back pain/Pain Management:Tylenol as needed  Controlled on 7/12  Monitor with increased mobility 4. Mood:LCSW tofollow for evaluation and support when appropriate. -antipsychotic agents: N/A 5. Neuropsych: This patient is notcapable of making decisions on herown behalf. 6. Skin/Wound Care:Routine pressure relief measures. 7. Fluids/Electrolytes/Nutrition:Monitor I/Os.   BMP within acceptable range on 7/10 8.T2DM: Hemoglobin A1c 7.1. Monitor blood sugars AC at bedtime. Resumed metformin at 500 mg bid ac.   Labile on 7/12, monitor for trend  Monitor with increased mobility 9.Seizures: Continue Keppra twice daily, Depakote TID.  Ativan DC'd  Plan to check Depakote level this week 10.  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  Mild transaminitis  ALT elevated on 7/10, labs ordered for tomorrow to ensure is not trending up on antiepileptics  LOS: 3 days A FACE TO FACE EVALUATION WAS PERFORMED  Ankit Lorie Phenix 05/15/2019, 12:07 PM

## 2019-05-15 NOTE — Progress Notes (Signed)
No attempts OOB without assistance. Assisted to BR with RW. After brushing teeth, ambulated to bed without walker. Expressive aphasia, at times difficult to figure out what patient needs/wants. Denies pain. Patrici Ranks A

## 2019-05-16 ENCOUNTER — Inpatient Hospital Stay (HOSPITAL_COMMUNITY): Payer: Medicare Other | Admitting: Speech Pathology

## 2019-05-16 ENCOUNTER — Inpatient Hospital Stay (HOSPITAL_COMMUNITY): Payer: Medicare Other

## 2019-05-16 ENCOUNTER — Inpatient Hospital Stay (HOSPITAL_COMMUNITY): Payer: Medicare Other | Admitting: Physical Therapy

## 2019-05-16 DIAGNOSIS — S069X1S Unspecified intracranial injury with loss of consciousness of 30 minutes or less, sequela: Secondary | ICD-10-CM

## 2019-05-16 LAB — GLUCOSE, CAPILLARY
Glucose-Capillary: 120 mg/dL — ABNORMAL HIGH (ref 70–99)
Glucose-Capillary: 159 mg/dL — ABNORMAL HIGH (ref 70–99)
Glucose-Capillary: 73 mg/dL (ref 70–99)
Glucose-Capillary: 135 mg/dL — ABNORMAL HIGH (ref 70–99)

## 2019-05-16 LAB — COMPREHENSIVE METABOLIC PANEL
ALT: 42 U/L (ref 0–44)
AST: 30 U/L (ref 15–41)
Albumin: 3.7 g/dL (ref 3.5–5.0)
Alkaline Phosphatase: 58 U/L (ref 38–126)
Anion gap: 9 (ref 5–15)
BUN: 23 mg/dL (ref 8–23)
CO2: 28 mmol/L (ref 22–32)
Calcium: 9.6 mg/dL (ref 8.9–10.3)
Chloride: 102 mmol/L (ref 98–111)
Creatinine, Ser: 0.91 mg/dL (ref 0.44–1.00)
GFR calc Af Amer: >60 mL/min (ref >60)
GFR calc non Af Amer: >60 mL/min (ref >60)
Glucose, Bld: 174 mg/dL — ABNORMAL HIGH (ref 70–99)
Potassium: 4 mmol/L (ref 3.5–5.1)
Sodium: 139 mmol/L (ref 135–145)
Total Bilirubin: 0.4 mg/dL (ref 0.3–1.2)
Total Protein: 7.1 g/dL (ref 6.5–8.1)

## 2019-05-16 LAB — VALPROIC ACID LEVEL: Valproic Acid Lvl: 122 ug/mL — ABNORMAL HIGH (ref 50.0–100.0)

## 2019-05-16 MED ORDER — LEVETIRACETAM 500 MG PO TABS
500.0000 mg | ORAL_TABLET | Freq: Two times a day (BID) | ORAL | Status: DC
  Administered 2019-05-17: 500 mg via ORAL
  Filled 2019-05-16: qty 1

## 2019-05-16 NOTE — Progress Notes (Signed)
Occupational Therapy Session Note  Patient Details  Name: Cathy Harrington MRN: [id number] Date of Birth: Apr 19, 1946  Today's Date: 05/16/2019 OT Individual Time: 1300-1400 OT Individual Time Calculation (min): 60 min    Short Term Goals: Week 1:  OT Short Term Goal 1 (Week 1): STG = LTG  Skilled Therapeutic Interventions/Progress Updates:    1:1. Pt received iwht husband present and no report of pain. Pt agreeable to bathing anddressing at overall supervision standing level with upt o CGA when threading BLE into pants despite cues for seated ADL participation to maximize safety awareness. Pt demo significant disorganization with gathering, retrievting and transporting ADL items for after shower making ~6 trips into/out of bathroom. Pt completes kitchen search activity with mod VC for sustained attention to task. Pt completes vacuuming of carpet with max VCfor sequencing, orienting placement of cord into socket and organization of vacuuming sections of carpet. Exited sesssion with pt seated in recliner, call light in reach and all need smet  Therapy Documentation Precautions:  Precautions Precautions: Fall Precaution Comments: receptive & expressive language deficits Restrictions Weight Bearing Restrictions: No General:   Vital Signs:   Pain:   ADL: ADL Eating: Set up Where Assessed-Eating: Edge of bed Grooming: Minimal assistance, Minimal cueing Where Assessed-Grooming: Sitting at sink Upper Body Bathing: Supervision/safety Where Assessed-Upper Body Bathing: Shower Lower Body Bathing: Moderate assistance, Minimal cueing Where Assessed-Lower Body Bathing: Shower Upper Body Dressing: Minimal assistance Where Assessed-Upper Body Dressing: Standing at sink Lower Body Dressing: Moderate assistance Where Assessed-Lower Body Dressing: Wheelchair Toileting: Moderate assistance Where Assessed-Toileting: Glass blower/designer: Psychiatric nurse Method: Actuary: Energy manager: Minimal assistance, Minimal cueing Social research officer, government Method: Heritage manager: Radio broadcast assistant ADL Comments: cues for Pharmacist, hospital    Praxis   Exercises:   Other Treatments:     Therapy/Group: Individual Therapy  [name] 05/16/2019, 1:50 PM

## 2019-05-16 NOTE — Progress Notes (Signed)
[facility] PHYSICAL MEDICINE & REHABILITATION PROGRESS NOTE  Subjective/Complaints: Pt up with PT. No new complaints. Had a good weekend.   ROS: Patient denies fever, rash, sore throat, blurred vision, nausea, vomiting, diarrhea, cough, shortness of breath or chest pain, joint or back pain, headache, or mood change.  A little limited   Objective: Vital Signs: Blood pressure 128/78, pulse 79, temperature 97.7 F (36.5 C), temperature source Oral, resp. rate 14, height 5\' 4"  (1.626 m), weight 78.2 kg, SpO2 95 %. No results found. No results for input(s): WBC, HGB, HCT, PLT in the last 72 hours. No results for input(s): NA, K, CL, CO2, GLUCOSE, BUN, CREATININE, CALCIUM in the last 72 hours.  Physical Exam: BP 128/78 (BP Location: Left Arm)   Pulse 79   Temp 97.7 F (36.5 C) (Oral)   Resp 14   Ht 5\' 4"  (1.626 m)   Wt 78.2 kg   SpO2 95%   BMI 29.59 kg/m  Constitutional: No distress . Vital signs reviewed. HEENT: EOMI, oral membranes moist Neck: supple Cardiovascular: RRR without murmur. No JVD    Respiratory: CTA Bilaterally without wheezes or rales. Normal effort    GI: BS +, non-tender, non-distended  Musc: No edema or tenderness in extremities. Neurologic:  Alert Expressive deficit improving, tends to be a little tangential at times. Does better when redirected or cued verbally Motor: Grossly 4+/5 throughout Psych:  Oriented to hospital, [place], month, day of week, year  Assessment/Plan: 1. 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  Physiatrist is providing close team supervision and 24 hour management of active medical problems listed below.  Physiatrist and rehab team continue to assess barriers to discharge/monitor patient progress toward functional and medical goals  Care Tool:  Bathing    Body parts bathed by patient: Right arm, Left arm, Chest,  Abdomen, Front perineal area, Buttocks, Right upper leg, Left upper leg, Face, Right lower leg, Left lower leg   Body parts bathed by helper: Right lower leg, Left lower leg, Buttocks     Bathing assist Assist Level: Supervision/Verbal cueing     Upper Body Dressing/Undressing Upper body dressing   What is the patient wearing?: Bra, Pull over shirt    Upper body assist Assist Level: Supervision/Verbal cueing    Lower Body Dressing/Undressing Lower body dressing      What is the patient wearing?: Underwear/pull up, Pants     Lower body assist Assist for lower body dressing: Contact Guard/Touching assist     Toileting Toileting    Toileting assist Assist for toileting: Contact Guard/Touching assist     Transfers Chair/bed transfer  Transfers assist     Chair/bed transfer assist level: Supervision/Verbal cueing     Locomotion Ambulation   Ambulation assist      Assist level: Supervision/Verbal cueing Assistive device: No Device Max distance: 150 ft   Walk 10 feet activity   Assist     Assist level: Supervision/Verbal cueing Assistive device: No Device   Walk 50 feet activity   Assist    Assist level: Supervision/Verbal cueing Assistive device: No Device    Walk 150 feet activity   Assist Walk 150 feet activity did not occur: Safety/medical concerns  Assist level: Supervision/Verbal cueing Assistive device: No Device    Walk 10 feet on uneven surface  activity   Assist     Assist level: Minimal Assistance - Patient > 75%  Wheelchair     Assist Will patient use wheelchair at discharge?: No Type of Wheelchair: Manual    Wheelchair assist level: Supervision/Verbal cueing Max wheelchair distance: 50 ft    Wheelchair 50 feet with 2 turns activity    Assist    Wheelchair 50 feet with 2 turns activity did not occur: Safety/medical concerns       Wheelchair 150 feet activity     Assist Wheelchair 150 feet  activity did not occur: Safety/medical concerns          Medical Problem List and Plan: 1. Decline in functional mobility and ADLs secondary to TBI and seizures  Continue CIR PT, OT, SLP 2. Antithrombotics: -DVT/anticoagulation:Pharmaceutical:Lovenox -antiplatelet therapy: N/A 3.Chronic back pain/Pain Management:Tylenol as needed  Controlled on 7/13    4. Mood:LCSW tofollow for evaluation and support when appropriate. -antipsychotic agents: N/A 5. Neuropsych: This patient is notcapable of making decisions on herown behalf. 6. Skin/Wound Care:Routine pressure relief measures. 7. Fluids/Electrolytes/Nutrition:Monitor I/Os.   BMP within acceptable range on 7/10, follow up today 8.T2DM: Hemoglobin A1c 7.1. Monitor blood sugars AC at bedtime. Resumed metformin at 500 mg bid ac.   Labile on 7/12, monitor for trend  Monitor with increased mobility 9.Seizures: Continue Keppra twice daily, Depakote TID.  Ativan DC'd  Plan to check Depakote level today with labs to be drawn 10.  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  Mild transaminitis  ALT elevated on 7/10, labs pending today as above    LOS: 4 days A FACE TO FACE EVALUATION WAS PERFORMED  Meredith Staggers 05/16/2019, 10:34 AM

## 2019-05-16 NOTE — Progress Notes (Signed)
Physical Therapy Session Note  Patient Details  Name: EMALIE MCWETHY MRN: [id number] Date of Birth: [birth year]  Today's Date: 05/16/2019 PT Individual Time: 0809-0903 PT Individual Time Calculation (min): 54 min   Short Term Goals: Week 1:  PT Short Term Goal 1 (Week 1): STG = LTG due to short ELOS.  Skilled Therapeutic Interventions/Progress Updates:  Pt received in bed & agreeable to tx. Pt transfers to EOB with mod I & dons tennis shoes with set up assist. Pt ambulates in room without AD & supervision to obtain items from dresser. Pt ambulates throughout unit without AD & close supervision & negotiates 12 steps (6") with R ascending rail with CGA fade to close supervision with pt self selecting gait pattern. In hallway pt performs side stepping to R & retrograde gait with CGA with task focusing on dynamic balance, weight shifting L<>R, R NMR, and R hip abductor strengthening. Pt engaged in dynavision while standing on noncompliant floor with normal BOS, then compliant surface with normal progressing to narrow BOS with close supervision<>CGA for balance with pt using RUE for task for forced use for NMR. Pt carried cups stacked on board around unit with close supervision for gait and min cuing to attend to balance cups but pt without any spills. Pt completes car transfer at sedan simulated height with supervision. At end of session pt left in bed with alarm set, call bell in reach, & husband present to supervise.   Therapy Documentation Precautions:  Precautions Precautions: Fall Precaution Comments: receptive & expressive language deficits Restrictions Weight Bearing Restrictions: No  Pain:  Therapy/Group: Individual Therapy  Waunita Schooner 05/16/2019, 9:05 AM

## 2019-05-16 NOTE — Progress Notes (Signed)
Inpatient Rehabilitation Admissions Coordinator Patient is aware of her daughter's admission to hospital as well as her admit to CIR. Pt's spouse informed her today. I provided patient with the phone number to her daughter's room. Daughter is aware that Mom now knows of her admission.  Danne Baxter, RN, MSN Rehab Admissions Coordinator [phone] 05/16/2019 3:55 PM

## 2019-05-16 NOTE — Progress Notes (Signed)
Slept good. Calling for assistance. Expressive aphasia, but making needs known. Cont.B&B. Denies pain.Cathy Harrington A

## 2019-05-16 NOTE — Plan of Care (Signed)
  Problem: Consults Goal: RH BRAIN INJURY PATIENT EDUCATION Description: Description: See Patient Education module for eduction specifics Outcome: Progressing Goal: Skin Care Protocol Initiated - if Braden Score 18 or less Description: If consults are not indicated, leave blank or document N/A Outcome: Progressing Goal: Diabetes Guidelines if Diabetic/Glucose > 140 Description: If diabetic or lab glucose is > 140 mg/dl - Initiate Diabetes/Hyperglycemia Guidelines & Document Interventions  Outcome: Progressing   Problem: RH BOWEL ELIMINATION Goal: RH STG MANAGE BOWEL WITH ASSISTANCE Description: STG Manage Bowel with min Assistance. Outcome: Progressing Goal: RH STG MANAGE BOWEL W/MEDICATION W/ASSISTANCE Description: STG Manage Bowel with Medication with min Assistance. Outcome: Progressing   Problem: RH BLADDER ELIMINATION Goal: RH STG MANAGE BLADDER WITH ASSISTANCE Description: STG Manage Bladder With min Assistance Outcome: Progressing Goal: RH STG MANAGE BLADDER WITH MEDICATION WITH ASSISTANCE Description: STG Manage Bladder With Medication With min Assistance. Outcome: Progressing Goal: RH STG MANAGE BLADDER WITH EQUIPMENT WITH ASSISTANCE Description: STG Manage Bladder With Equipment With min Assistance Outcome: Progressing   Problem: RH SKIN INTEGRITY Goal: RH STG SKIN FREE OF INFECTION/BREAKDOWN Description: Skin will be free of infection/breakdown with min assist Outcome: Progressing Goal: RH STG MAINTAIN SKIN INTEGRITY WITH ASSISTANCE Description: STG Maintain Skin Integrity With min Assistance. Outcome: Progressing   Problem: RH SAFETY Goal: RH STG ADHERE TO SAFETY PRECAUTIONS W/ASSISTANCE/DEVICE Description: STG Adhere to Safety Precautions With min Assistance/Device. Outcome: Progressing Goal: RH STG DECREASED RISK OF FALL WITH ASSISTANCE Description: STG Decreased Risk of Fall With min Assistance. Outcome: Progressing   Problem: RH PAIN MANAGEMENT Goal: RH  STG PAIN MANAGED AT OR BELOW PT'S PAIN GOAL Description: Less than 3 out of 10 Outcome: Progressing   Problem: RH KNOWLEDGE DEFICIT BRAIN INJURY Goal: RH STG INCREASE KNOWLEDGE OF SELF CARE AFTER BRAIN INJURY Description: Pt will be able to verbalize safety precautions in home Outcome: Progressing

## 2019-05-16 NOTE — Progress Notes (Signed)
Speech Language Pathology Daily Session Note  Patient Details  Name: Cathy Harrington MRN: [id number] Date of Birth: Apr 28, 1946  Today's Date: 05/16/2019 SLP Individual Time: 1000-1100 SLP Individual Time Calculation (min): 60 min  Short Term Goals: Week 1: SLP Short Term Goal 1 (Week 1): Pt will answer simple yes/no questions accurately with Mod A verbal/visual cues SLP Short Term Goal 2 (Week 1): Pt will follow 1-step commands accurately with Mod A verbal/visual cues SLP Short Term Goal 3 (Week 1): Pt will express basic wants and needs at the phrase level with Mod A multimodal cues SLP Short Term Goal 4 (Week 1): Pt will name basic familiar objects with 75% accuracy given Mod A multimodal cues  Skilled Therapeutic Interventions:  Skilled treatment session focused on communication goals. SLP facilitated word finding by providing Max A cues to cease talking in attempt to decrease perseverative topics and comments. Pt benefited greatly from having written cues. For example to aid in recall of her son's name, SLP wrote basic/simple sentences about him: [name] is my son. [name] lives in [name].  [name] is 6 years old.  My son's name is _________________  Having the written cues helped pt with attention and to decrease perseverative off topic comments and perseverative motor patterns. Education provided to pt's husband to review information in this manner to promote functional language. A separate page was created for her daughter and a third page created for hobbies. Pt with no ability to express which TV shows that she liked and her description was perseverative. All questions answered to pt and husband's satisfaction. Husband to use notebook to promote functional language.      Pain Pain Assessment Pain Scale: 0-10 Pain Score: 0-No pain  Therapy/Group: Individual Therapy  Elaf Clauson 05/16/2019, 11:41 AM

## 2019-05-16 NOTE — Progress Notes (Signed)
Physical Therapy Session Note  Patient Details  Name: Cathy Harrington MRN: [id number] Date of Birth: January 23, 1946  Today's Date: 05/16/2019 PT Individual Time: 1130-1200 PT Individual Time Calculation (min): 30 min   Short Term Goals: Week 1:  PT Short Term Goal 1 (Week 1): STG = LTG due to short ELOS.  Skilled Therapeutic Interventions/Progress Updates:   Session focused on dual task activities to focus on balance, coordination, and cognition with gait and toe taps to cones and stepping over objects. Pt requires max verbal and demonstrational cues to complete this obstacle course. Steadying assist for picking up items from floor to clean up items. Functional gait at close supervision level for safety due to decreased awareness of obstacles and pathfinding. On the way back to room, pt to perform ball toss and pathfind to room with overall CGA for balance. Husband present during session.   Therapy Documentation Precautions:  Precautions Precautions: Fall Precaution Comments: receptive & expressive language deficits Restrictions Weight Bearing Restrictions: No   Pain: Pain Assessment Pain Scale: 0-10 Pain Score: 0-No pain    Therapy/Group: Individual Therapy  Canary Brim Ivory Broad, PT, DPT, CBIS  05/16/2019, 12:28 PM

## 2019-05-17 ENCOUNTER — Inpatient Hospital Stay (HOSPITAL_COMMUNITY): Payer: Medicare Other

## 2019-05-17 ENCOUNTER — Inpatient Hospital Stay (HOSPITAL_COMMUNITY): Payer: Medicare Other | Admitting: Speech Pathology

## 2019-05-17 ENCOUNTER — Inpatient Hospital Stay (HOSPITAL_COMMUNITY): Payer: Medicare Other | Admitting: Physical Therapy

## 2019-05-17 LAB — GLUCOSE, CAPILLARY
Glucose-Capillary: 131 mg/dL — ABNORMAL HIGH (ref 70–99)
Glucose-Capillary: 94 mg/dL (ref 70–99)
Glucose-Capillary: 109 mg/dL — ABNORMAL HIGH (ref 70–99)
Glucose-Capillary: 142 mg/dL — ABNORMAL HIGH (ref 70–99)

## 2019-05-17 MED ORDER — LEVETIRACETAM 500 MG PO TABS
500.0000 mg | ORAL_TABLET | Freq: Once | ORAL | Status: AC
  Administered 2019-05-17: 500 mg via ORAL
  Filled 2019-05-17: qty 1

## 2019-05-17 MED ORDER — LEVETIRACETAM 500 MG PO TABS
1000.0000 mg | ORAL_TABLET | Freq: Two times a day (BID) | ORAL | Status: DC
  Administered 2019-05-17 – 2019-05-19 (×4): 1000 mg via ORAL
  Filled 2019-05-17 (×4): qty 2

## 2019-05-17 MED ORDER — LEVETIRACETAM 500 MG PO TABS: 500.0000 mg | ORAL_TABLET | Freq: Once | ORAL | Status: AC

## 2019-05-17 MED ORDER — VALPROIC ACID 250 MG PO CAPS
250.0000 mg | ORAL_CAPSULE | Freq: Three times a day (TID) | ORAL | Status: DC
  Administered 2019-05-17 – 2019-05-19 (×6): 250 mg via ORAL
  Filled 2019-05-17 (×8): qty 1

## 2019-05-17 NOTE — Progress Notes (Signed)
Speech Language Pathology Daily Session Note  Patient Details  Name: Cathy Harrington MRN: [id number] Date of Birth: 04-06-1946  Today's Date: 05/17/2019 SLP Individual Time: 0725-0825 SLP Individual Time Calculation (min): 60 min  Short Term Goals: Week 1: SLP Short Term Goal 1 (Week 1): Pt will answer simple yes/no questions accurately with Mod A verbal/visual cues SLP Short Term Goal 2 (Week 1): Pt will follow 1-step commands accurately with Mod A verbal/visual cues SLP Short Term Goal 3 (Week 1): Pt will express basic wants and needs at the phrase level with Mod A multimodal cues SLP Short Term Goal 4 (Week 1): Pt will name basic familiar objects with 75% accuracy given Mod A multimodal cues  Skilled Therapeutic Interventions: Skilled treatment session focused on cognitive-linguistic goals. SLP facilitated session by providing Mod A verbal and visual cues for patient to self-monitor and correct phonemic errors while reading aloud at the phrase level. Patient also completed a phrase closure task from a field of 2 with 90% accuracy. Patient with intermittent perseveration on topics which required Mod verbal cues for redirection. Patient left upright in bed with alarm on and all needs within reach. Continue with current plan of care.      Pain No/Denies Pain   Therapy/Group: Individual Therapy  Kimmy Totten 05/17/2019, 2:52 PM

## 2019-05-17 NOTE — Progress Notes (Signed)
Physical Therapy Session Note  Patient Details  Name: Cathy Harrington MRN: [id number] Date of Birth: 09-Mar-1946  Today's Date: 05/17/2019 PT Individual Time: 4332-9518 PT Individual Time Calculation (min): 72 min   Short Term Goals: Week 1:  PT Short Term Goal 1 (Week 1): STG = LTG due to short ELOS.  Skilled Therapeutic Interventions/Progress Updates:  Pt received in bathroom in care of RN & handed off to PT. Pt with continent void & performs peri hygiene & clothing management without assistance. Pt ambulates out of bathroom & in room without AD & supervision. Pt performs hand hygiene and brushes hair standing at sink with supervision for standing balance. Pt ambulates around unit without AD & close supervision with slightly decreased weight shifting L. Pt engaged in zoom ball with normal and narrow BOS with task focusing on standing balance and RUE strengthening & NMR. Pt transferred to quadruped on mat table and lifts 1 extremity at a time progressing to bird dog exercises with demonstration & multimodal cuing with pt demonstrating impaired balance & trunk control during activity. Pt completes floor transfer with supervision. While on floor pt performs BLE bridging and RLE single leg bridges with improved but still impaired RLE control during single leg bridges with exercises focusing on RLE strengthening & NMR. Pt negotiates 8 steps (6") with R ascending rail with close supervision & pt self selecting reciprocal gait pattern. Pt stood on rocker board without BUE support but min<>mod assist for balance with max cuing for ankle strategies & righting reactions as pt had challenges to balance A/P & laterally; pt tends to use BUE on parallel bars to correct LOB vs ankle strategies & righting reactions. Pt with impaired attention to task as well as impaired awareness as pt will have LOB with COG too far over BOS to correct without BUE support and pt not aware until the last second & uses hands to prevent  significant LOB. Pt correctly assembles 2 moderately complex peg board designs from choice of many without any assistance or errors. Pt utilizes nu-step up to level 4 x 8 minutes with all four extremities with task focusing on global strengthening & endurance training, RUE strengthening & NMR, & coordination of reciprocal movements. Pt continues to require MAX cuing to locate room. At end of session pt left in bed with alarm set & all needs in reach.   Pt reports she has been hearing sounds/voices/noises at night - RN made aware.  Pt appears to have impaired attention and requires cuing to listen to therapist when giving instructions for task as pt attempts to talk and not focus on commands.  Therapy Documentation Precautions:  Precautions Precautions: Fall Precaution Comments: receptive & expressive language deficits Restrictions Weight Bearing Restrictions: No  Pain: No c/o pain reported.   Therapy/Group: Individual Therapy  Waunita Schooner 05/17/2019, 10:48 AM

## 2019-05-17 NOTE — Progress Notes (Signed)
[facility] PHYSICAL MEDICINE & REHABILITATION PROGRESS NOTE  Subjective/Complaints: Up at EOB. In good spirits. Aware of daughter's admission to hospital/rehab  ROS: Patient denies fever, rash, sore throat, blurred vision, nausea, vomiting, diarrhea, cough, shortness of breath or chest pain, joint or back pain, headache, or mood change.   Objective: Vital Signs: Blood pressure (!) 149/72, pulse 89, temperature 97.9 F (36.6 C), temperature source Oral, resp. rate 15, height 5\' 4"  (1.626 m), weight 78.2 kg, SpO2 95 %. No results found. No results for input(s): WBC, HGB, HCT, PLT in the last 72 hours. Recent Labs    05/16/19 1126  NA 139  K 4.0  CL 102  CO2 28  GLUCOSE 174*  BUN 23  CREATININE 0.91  CALCIUM 9.6    Physical Exam: BP (!) 149/72 (BP Location: Left Arm)   Pulse 89   Temp 97.9 F (36.6 C) (Oral)   Resp 15   Ht 5\' 4"  (1.626 m)   Wt 78.2 kg   SpO2 95%   BMI 29.59 kg/m  Constitutional: No distress . Vital signs reviewed. HEENT: EOMI, oral membranes moist Neck: supple Cardiovascular: RRR without murmur. No JVD    Respiratory: CTA Bilaterally without wheezes or rales. Normal effort    GI: BS +, non-tender, non-distended  Musc: No edema or tenderness in extremities. Neurologic:  Alert Verbose, tangential. Some word finding deficits but seem more cultural. Motor: Grossly 4+/5 throughout. Good sitting balance Psych:  Oriented to hospital, [place], month, day of week, year  Assessment/Plan: 1. 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  Physiatrist is providing close team supervision and 24 hour management of active medical problems listed below.  Physiatrist and rehab team continue to assess barriers to discharge/monitor patient progress toward functional and medical goals  Care Tool:  Bathing    Body parts bathed by patient: Right arm, Left  arm, Chest, Abdomen, Front perineal area, Buttocks, Right upper leg, Left upper leg, Face, Right lower leg, Left lower leg   Body parts bathed by helper: Right lower leg, Left lower leg, Buttocks     Bathing assist Assist Level: Supervision/Verbal cueing     Upper Body Dressing/Undressing Upper body dressing   What is the patient wearing?: Bra, Pull over shirt    Upper body assist Assist Level: Supervision/Verbal cueing    Lower Body Dressing/Undressing Lower body dressing      What is the patient wearing?: Underwear/pull up, Pants     Lower body assist Assist for lower body dressing: Contact Guard/Touching assist     Toileting Toileting    Toileting assist Assist for toileting: Contact Guard/Touching assist     Transfers Chair/bed transfer  Transfers assist     Chair/bed transfer assist level: Supervision/Verbal cueing     Locomotion Ambulation   Ambulation assist      Assist level: Supervision/Verbal cueing Assistive device: No Device Max distance: 150 ft   Walk 10 feet activity   Assist     Assist level: Supervision/Verbal cueing Assistive device: No Device   Walk 50 feet activity   Assist    Assist level: Supervision/Verbal cueing Assistive device: No Device    Walk 150 feet activity   Assist Walk 150 feet activity did not occur: Safety/medical concerns  Assist level: Supervision/Verbal cueing Assistive device: No Device    Walk 10 feet on uneven surface  activity   Assist     Assist  level: Minimal Assistance - Patient > 75%     Wheelchair     Assist Will patient use wheelchair at discharge?: No Type of Wheelchair: Manual    Wheelchair assist level: Supervision/Verbal cueing Max wheelchair distance: 50 ft    Wheelchair 50 feet with 2 turns activity    Assist    Wheelchair 50 feet with 2 turns activity did not occur: Safety/medical concerns       Wheelchair 150 feet activity     Assist Wheelchair  150 feet activity did not occur: Safety/medical concerns          Medical Problem List and Plan: 1. Decline in functional mobility and ADLs secondary to TBI and seizures  Continue CIR PT, OT, SLP  --Interdisciplinary Team Conference today   2. Antithrombotics: -DVT/anticoagulation:Pharmaceutical:Lovenox -antiplatelet therapy: N/A 3.Chronic back pain/Pain Management:Tylenol as needed  Controlled on 7/14    4. Mood:LCSW tofollow for evaluation and support when appropriate. -antipsychotic agents: N/A 5. Neuropsych: This patient is notcapable of making decisions on herown behalf. 6. Skin/Wound Care:Routine pressure relief measures. 7. Fluids/Electrolytes/Nutrition:Monitor I/Os.   BMP within acceptable range on 7/13 8.T2DM: Hemoglobin A1c 7.1. Monitor blood sugars AC at bedtime. Resumed metformin at 500 mg bid ac.   Sugars demonstrating improvement 9.Seizures: Continue Keppra twice daily  Ativan DC'd  Depakote level supratherapeutic, decreased to 250mg  TID 10.  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  Mild transaminitis  ALT and all LFT's normal 7/13    LOS: 5 days A FACE TO FACE EVALUATION WAS PERFORMED  Meredith Staggers 05/17/2019, 9:19 AM

## 2019-05-17 NOTE — Plan of Care (Signed)
  Problem: Consults Goal: RH BRAIN INJURY PATIENT EDUCATION Description: Description: See Patient Education module for eduction specifics Outcome: Progressing Goal: Skin Care Protocol Initiated - if Braden Score 18 or less Description: If consults are not indicated, leave blank or document N/A Outcome: Progressing Goal: Diabetes Guidelines if Diabetic/Glucose > 140 Description: If diabetic or lab glucose is > 140 mg/dl - Initiate Diabetes/Hyperglycemia Guidelines & Document Interventions  Outcome: Progressing   Problem: RH BOWEL ELIMINATION Goal: RH STG MANAGE BOWEL WITH ASSISTANCE Description: STG Manage Bowel with min Assistance. Outcome: Progressing Goal: RH STG MANAGE BOWEL W/MEDICATION W/ASSISTANCE Description: STG Manage Bowel with Medication with min Assistance. Outcome: Progressing   Problem: RH BLADDER ELIMINATION Goal: RH STG MANAGE BLADDER WITH ASSISTANCE Description: STG Manage Bladder With min Assistance Outcome: Progressing Goal: RH STG MANAGE BLADDER WITH MEDICATION WITH ASSISTANCE Description: STG Manage Bladder With Medication With min Assistance. Outcome: Progressing Goal: RH STG MANAGE BLADDER WITH EQUIPMENT WITH ASSISTANCE Description: STG Manage Bladder With Equipment With min Assistance Outcome: Progressing   Problem: RH SKIN INTEGRITY Goal: RH STG SKIN FREE OF INFECTION/BREAKDOWN Description: Skin will be free of infection/breakdown with min assist Outcome: Progressing Goal: RH STG MAINTAIN SKIN INTEGRITY WITH ASSISTANCE Description: STG Maintain Skin Integrity With min Assistance. Outcome: Progressing   Problem: RH SAFETY Goal: RH STG ADHERE TO SAFETY PRECAUTIONS W/ASSISTANCE/DEVICE Description: STG Adhere to Safety Precautions With min Assistance/Device. Outcome: Progressing Goal: RH STG DECREASED RISK OF FALL WITH ASSISTANCE Description: STG Decreased Risk of Fall With min Assistance. Outcome: Progressing   Problem: RH PAIN MANAGEMENT Goal: RH  STG PAIN MANAGED AT OR BELOW PT'S PAIN GOAL Description: Less than 3 out of 10 Outcome: Progressing   Problem: RH KNOWLEDGE DEFICIT BRAIN INJURY Goal: RH STG INCREASE KNOWLEDGE OF SELF CARE AFTER BRAIN INJURY Description: Pt will be able to verbalize safety precautions in home Outcome: Progressing

## 2019-05-17 NOTE — Progress Notes (Signed)
Occupational Therapy Session Note  Patient Details  Name: Cathy Harrington MRN: [id number] Date of Birth: Apr 02, 1946  Today's Date: 05/17/2019 OT Individual Time: 1330-1430 OT Individual Time Calculation (min): 60 min    Short Term Goals: Week 1:  OT Short Term Goal 1 (Week 1): STG = LTG  Skilled Therapeutic Interventions/Progress Updates:    1:1. Pt agreeable to bathing and dressing with no c/o pain. Pt completes all transfers/ambulation with supervision with improved organization of ADL tasks gathering items. Pt completes all ADLs at supervision overall with MOD VC for seated LB ADLs. Husband was present throughout session and aware of need for supervision during ADLs and aware of cuing needed during ADLs. Pt ambulates in hallway to search for 10 small items in hallway requiring 4x back and forth to locate all 10 items. Pt completes 3x20 cycles of standing kinetron on 25 cm/sec velocity for reciprocal movement training and R NMR. Exited session wit pt seated in bed, exit alar on and call light in reach.  Therapy Documentation Precautions:  Precautions Precautions: Fall Precaution Comments: receptive & expressive language deficits Restrictions Weight Bearing Restrictions: No General:   Vital Signs: Therapy Vitals Temp: 97.7 F (36.5 C) Temp Source: Oral Pulse Rate: 81 Resp: 20 BP: 110/62 Patient Position (if appropriate): Lying Oxygen Therapy SpO2: 97 % O2 Device: Room Air Pain:   ADL: ADL Eating: Set up Where Assessed-Eating: Edge of bed Grooming: Minimal assistance, Minimal cueing Where Assessed-Grooming: Sitting at sink Upper Body Bathing: Supervision/safety Where Assessed-Upper Body Bathing: Shower Lower Body Bathing: Moderate assistance, Minimal cueing Where Assessed-Lower Body Bathing: Shower Upper Body Dressing: Minimal assistance Where Assessed-Upper Body Dressing: Standing at sink Lower Body Dressing: Moderate assistance Where Assessed-Lower Body Dressing:  Wheelchair Toileting: Moderate assistance Where Assessed-Toileting: Glass blower/designer: Psychiatric nurse Method: Arts development officer: Energy manager: Minimal assistance, Minimal cueing Social research officer, government Method: Heritage manager: Radio broadcast assistant ADL Comments: cues for Pharmacist, hospital    Praxis   Exercises:   Other Treatments:     Therapy/Group: Individual Therapy  [name] 05/17/2019, 2:39 PM

## 2019-05-18 ENCOUNTER — Inpatient Hospital Stay (HOSPITAL_COMMUNITY): Payer: Medicare Other

## 2019-05-18 ENCOUNTER — Inpatient Hospital Stay (HOSPITAL_COMMUNITY): Payer: Medicare Other | Admitting: Physical Therapy

## 2019-05-18 ENCOUNTER — Inpatient Hospital Stay (HOSPITAL_COMMUNITY): Payer: Medicare Other | Admitting: Speech Pathology

## 2019-05-18 LAB — BASIC METABOLIC PANEL
Anion gap: 12 (ref 5–15)
BUN: 25 mg/dL — ABNORMAL HIGH (ref 8–23)
CO2: 26 mmol/L (ref 22–32)
Calcium: 9.5 mg/dL (ref 8.9–10.3)
Chloride: 101 mmol/L (ref 98–111)
Creatinine, Ser: 0.85 mg/dL (ref 0.44–1.00)
GFR calc Af Amer: >60 mL/min (ref >60)
GFR calc non Af Amer: >60 mL/min (ref >60)
Glucose, Bld: 170 mg/dL — ABNORMAL HIGH (ref 70–99)
Potassium: 4.4 mmol/L (ref 3.5–5.1)
Sodium: 139 mmol/L (ref 135–145)

## 2019-05-18 LAB — CBC
HCT: 42.8 % (ref 36.0–46.0)
Hemoglobin: 13.5 g/dL (ref 12.0–15.0)
MCH: 28.2 pg (ref 26.0–34.0)
MCHC: 31.5 g/dL (ref 30.0–36.0)
MCV: 89.4 fL (ref 80.0–100.0)
Platelets: 212 10*3/uL (ref 150–400)
RBC: 4.79 MIL/uL (ref 3.87–5.11)
RDW: 13.7 % (ref 11.5–15.5)
WBC: 5.6 10*3/uL (ref 4.0–10.5)
nRBC: 0 % (ref 0.0–0.2)

## 2019-05-18 LAB — GLUCOSE, CAPILLARY
Glucose-Capillary: 178 mg/dL — ABNORMAL HIGH (ref 70–99)
Glucose-Capillary: 56 mg/dL — ABNORMAL LOW (ref 70–99)
Glucose-Capillary: 108 mg/dL — ABNORMAL HIGH (ref 70–99)
Glucose-Capillary: 134 mg/dL — ABNORMAL HIGH (ref 70–99)
Glucose-Capillary: 164 mg/dL — ABNORMAL HIGH (ref 70–99)

## 2019-05-18 NOTE — Progress Notes (Signed)
[facility] PHYSICAL MEDICINE & REHABILITATION PROGRESS NOTE  Subjective/Complaints: No new complaints. Had a reasonable night  ROS: Limited due to cognitive/behavioral, language   Objective: Vital Signs: Blood pressure (!) 115/58, pulse 83, temperature 97.9 F (36.6 C), temperature source Oral, resp. rate 18, height 5\' 4"  (1.626 m), weight 78.3 kg, SpO2 95 %. No results found. Recent Labs    05/18/19 0613  WBC 5.6  HGB 13.5  HCT 42.8  PLT 212   Recent Labs    05/16/19 1126 05/18/19 0613  NA 139 139  K 4.0 4.4  CL 102 101  CO2 28 26  GLUCOSE 174* 170*  BUN 23 25*  CREATININE 0.91 0.85  CALCIUM 9.6 9.5    Physical Exam: BP (!) 115/58 (BP Location: Right Arm)   Pulse 83   Temp 97.9 F (36.6 C) (Oral)   Resp 18   Ht 5\' 4"  (1.626 m)   Wt 78.3 kg   SpO2 95%   BMI 29.64 kg/m  Constitutional: No distress . Vital signs reviewed. HEENT: EOMI, oral membranes moist Neck: supple Cardiovascular: RRR without murmur. No JVD    Respiratory: CTA Bilaterally without wheezes or rales. Normal effort    GI: BS +, non-tender, non-distended  Musc: No edema or tenderness in extremities. Neurologic:  Alert Remains distracted, tangential. Some word finding deficits but seem more cultural. Motor: Grossly 4+/5 throughout. Good sitting balance Psych:  Oriented to hospital, [place], month, day of week, year  Assessment/Plan: 1. 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  Physiatrist is providing close team supervision and 24 hour management of active medical problems listed below.  Physiatrist and rehab team continue to assess barriers to discharge/monitor patient progress toward functional and medical goals  Care Tool:  Bathing    Body parts bathed by patient: Right arm, Left arm, Chest, Abdomen, Front perineal area, Buttocks, Right upper leg, Left upper leg, Face,  Right lower leg, Left lower leg   Body parts bathed by helper: Right lower leg, Left lower leg, Buttocks     Bathing assist Assist Level: Supervision/Verbal cueing     Upper Body Dressing/Undressing Upper body dressing   What is the patient wearing?: Bra, Pull over shirt    Upper body assist Assist Level: Supervision/Verbal cueing    Lower Body Dressing/Undressing Lower body dressing      What is the patient wearing?: Underwear/pull up, Pants     Lower body assist Assist for lower body dressing: Contact Guard/Touching assist     Toileting Toileting    Toileting assist Assist for toileting: Contact Guard/Touching assist     Transfers Chair/bed transfer  Transfers assist     Chair/bed transfer assist level: Supervision/Verbal cueing     Locomotion Ambulation   Ambulation assist      Assist level: Supervision/Verbal cueing Assistive device: No Device Max distance: 150 ft   Walk 10 feet activity   Assist     Assist level: Supervision/Verbal cueing Assistive device: No Device   Walk 50 feet activity   Assist    Assist level: Supervision/Verbal cueing Assistive device: No Device    Walk 150 feet activity   Assist Walk 150 feet activity did not occur: Safety/medical concerns  Assist level: Supervision/Verbal cueing Assistive device: No Device    Walk 10 feet on uneven surface  activity   Assist     Assist level: Minimal Assistance - Patient > 75%  Wheelchair     Assist Will patient use wheelchair at discharge?: No Type of Wheelchair: Manual    Wheelchair assist level: Supervision/Verbal cueing Max wheelchair distance: 50 ft    Wheelchair 50 feet with 2 turns activity    Assist    Wheelchair 50 feet with 2 turns activity did not occur: Safety/medical concerns       Wheelchair 150 feet activity     Assist Wheelchair 150 feet activity did not occur: Safety/medical concerns          Medical Problem  List and Plan: 1. Decline in functional mobility and ADLs secondary to TBI and seizures  Continue CIR PT, OT, SLP  Working toward discharge, 2. Antithrombotics: -DVT/anticoagulation:Pharmaceutical:Lovenox -antiplatelet therapy: N/A 3.Chronic back pain/Pain Management:Tylenol as needed  Controlled on 7/15    4. Mood:LCSW tofollow for evaluation and support when appropriate. -antipsychotic agents: N/A 5. Neuropsych: This patient is notcapable of making decisions on herown behalf. 6. Skin/Wound Care:Routine pressure relief measures. 7. Fluids/Electrolytes/Nutrition:Monitor I/Os.   I personally reviewed the patient's labs today.    -BUN sl elevated---push po fluids 8.T2DM: Hemoglobin A1c 7.1. Monitor blood sugars AC at bedtime. Resumed metformin at 500 mg bid ac.   Sugars demonstrating general improvement---no changes today 9.Seizures: Continue Keppra twice daily  Ativan DC'd  Depakote level supratherapeutic, decreased to 250mg  TID 10.  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  Mild transaminitis  ALT and all LFT's normal 7/13    LOS: 6 days A FACE TO [place] 05/18/2019, 9:04 AM

## 2019-05-18 NOTE — Progress Notes (Signed)
Occupational Therapy Discharge Summary  Patient Details  Name: Cathy Harrington MRN: [id number] Date of Birth: October 19, 1946  Today's Date: 05/18/2019 OT Individual Time: 0800-0900 OT Individual Time Calculation (min): 60 min   1:1. Pt received in bed agreeable to bathing and dressing. Pt gathers underwear and socks and takes into bathroom. Pt reports need to toilet and no pain. Pt undresses seated on toilet with VC for doffing pants seated for safety. Pt completes ambulatory transfer to TTB with supervision and bathes seated iwht VC for seated washing of feet, otherwise pt bathes standing. Pt dresses sit to stand from [facility] with supervision. Pt grooms in standing at independent level. Pt completes ambulatory scavenger hunt for items on the unit written in list and verbally given by OT with MAX VC for searching , identifying strategies for locating items (asking for directions from staff) and attending to task d/t verbosity/distractibility. Pt able to locate 4/5 items. Pt given handout for energy conservation ideas to review with husband to modify ADL/IADL activities after a general review of energy conservation. Exited session with pt setaed in bed, exit alarm on and call light in reach  Patient has met 13 of 13 long term goals due to improved activity tolerance, improved balance, postural control, ability to compensate for deficits, functional use of  RIGHT upper and RIGHT lower extremity, improved attention, improved awareness and improved coordination.  Patient to discharge at overall Supervision level.  Patient's care partner is independent to provide the necessary cognitive assistance at discharge.    Reasons goals not met: n/a  Recommendation:  Patient will benefit from ongoing skilled OT services in outpatient setting to continue to advance functional skills in the area of BADL and iADL.  Equipment: shower chair  Reasons for discharge: treatment goals met and discharge from  hospital  Patient/family agrees with progress made and goals achieved: Yes  OT Discharge Precautions/Restrictions  Precautions Precautions: Fall Precaution Comments: receptive & expressive language deficits Restrictions Weight Bearing Restrictions: No General   Vital Signs Therapy Vitals Temp: 97.9 F (36.6 C) Temp Source: Oral Pulse Rate: 83 Resp: 18 BP: (!) 115/58 Patient Position (if appropriate): Lying Oxygen Therapy SpO2: 95 % O2 Device: Room Air Pain Pain Assessment Pain Score: 0-No pain ADL ADL Eating: Set up Where Assessed-Eating: Edge of bed Grooming: Supervision/safety Where Assessed-Grooming: Sitting at sink Upper Body Bathing: Supervision/safety Where Assessed-Upper Body Bathing: Shower Lower Body Bathing: Supervision/safety Where Assessed-Lower Body Bathing: Shower Upper Body Dressing: Supervision/safety Where Assessed-Upper Body Dressing: Standing at sink Lower Body Dressing: Supervision/safety Where Assessed-Lower Body Dressing: Wheelchair Toileting: Supervision/safety Where Assessed-Toileting: Glass blower/designer: Close supervision Toilet Transfer Method: Arts development officer: Energy manager: Close supervison Social research officer, government: Close supervision Social research officer, government Method: Heritage manager: Radio broadcast assistant ADL Comments: cues for safety Vision Baseline Vision/History: Wears glasses Patient Visual Report: No change from baseline Vision Assessment?: Yes Ocular Range of Motion: Within Functional Limits Alignment/Gaze Preference: Within Defined Limits Tracking/Visual Pursuits: Able to track stimulus in all quads without difficulty Saccades: Decreased speed of saccadic movement Convergence: Within functional limits Visual Fields: No apparent deficits Perception  Perception: Within Functional Limits Praxis Praxis: Intact Cognition Arousal/Alertness: Awake/alert Orientation  Level: Oriented X4 Attention: Focused Focused Attention: Appears intact Sustained Attention: Appears intact Awareness: Impaired Safety/Judgment: Impaired Sensation Sensation Light Touch: Appears Intact Hot/Cold: Not tested Proprioception: Appears Intact Stereognosis: Not tested Coordination Gross Motor Movements are Fluid and Coordinated: Yes Fine Motor Movements are Fluid and Coordinated: No Finger  Nose Finger Test: slow to complete Motor  Motor Motor: Abnormal postural alignment and control Motor - Skilled Clinical Observations: mild right side weakness, balance deficits, generalized deconditioning Mobility  Bed Mobility Rolling Right: Supervision/verbal cueing Rolling Left: Supervision/Verbal cueing Supine to Sit: Supervision/Verbal cueing Sit to Supine: Supervision/Verbal cueing Transfers Sit to Stand: Supervision/Verbal cueing Stand to Sit: Supervision/Verbal cueing  Trunk/Postural Assessment  Cervical Assessment Cervical Assessment: (head forward) Thoracic Assessment Thoracic Assessment: (rounded shoulders) Lumbar Assessment Lumbar Assessment: (posterior pelvic preference) Postural Control Postural Control: Deficits on evaluation Righting Reactions: delayed  Balance Dynamic Sitting Balance Dynamic Sitting - Level of Assistance: 6: Modified independent (Device/Increase time) Static Standing Balance Static Standing - Level of Assistance: 5: Stand by assistance Dynamic Standing Balance Dynamic Standing - Level of Assistance: 5: Stand by assistance Extremity/Trunk Assessment RUE Assessment RUE Assessment: Within Functional Limits General Strength Comments: 4/5 overall LUE Assessment LUE Assessment: Within Functional Limits   [name] 05/18/2019, 7:01 AM

## 2019-05-18 NOTE — Progress Notes (Signed)
Physical Therapy Discharge Summary  Patient Details  Name: Cathy Harrington MRN: [id number] Date of Birth: 1946/06/02  Today's Date: 05/18/2019 PT Individual Time: 1002-1100 PT Individual Time Calculation (min): 58 min    Patient has met 9 of 10 long term goals due to improved activity tolerance, improved balance, improved postural control, increased strength, ability to compensate for deficits, functional use of  right upper extremity and right lower extremity and improved coordination.  Patient to discharge at an ambulatory level Supervision.   Patient's care partner is independent to provide the necessary physical and cognitive assistance at discharge.  Reasons goals not met: pt did not meet cognitive goal 2/2 impaired memory/recall/awareness  Recommendation:  Patient will benefit from ongoing skilled PT services in outpatient setting to continue to advance safe functional mobility, address ongoing impairments in balance, R NMR, safety awareness & cognitive remediation, endurance, and minimize fall risk.  Equipment: No equipment provided  Reasons for discharge: treatment goals met  Patient/family agrees with progress made and goals achieved: Yes  Skilled PT Treatment: Pt received in bed & agreeable to tx. No c/o pain reported. Pt's husband present & therapist re educated him on pt's need for supervision for stair negotiation at home entrance, recommendation for pt not to go up/down steps to basement, and pt able to complete floor transfer with supervision but to get pt checked out if she falls & hits head at home; pt's husband reports comfort with all information but does not participate in hands on training during session. Pt completes bed mobility in bed without rails & bed flat with independence. Pt dons shoes sitting EOB & ambulates in room/bathroom without AD & supervision, completing toilet transfer with distant supervision & clothing management & hand hygiene with distant supervision.  Pt ambulates around unit without AD & supervision with increased gait speed on this date but still slightly decreased weight shifting L. Pt negotiates ramp without AD & supervision, mulch without AD & CGA 2/2 poor attention to task and decreased balance, negotiates 12 steps (6") with R ascending rail and supervision, completes car transfer with supervision, and retrieves item from floor with supervision. Pt completes Berg Balance Test & scores 43/56; Patient demonstrates increased fall risk as noted by score of 43/56 on Berg Balance Scale.  (<36= high risk for falls, close to 100%; 37-45 significant >80%; 46-51 moderate >50%; 52-55 lower >25%). Pt engaged in trampoline ball toss while standing on compliant surface with normal and narrow BOS with task focusing on balance & pt requiring min assist. Pt engages in dual task of walking while tossing ball with pt continuing to demonstrate internal distraction throughout but supervision only required for balance. Pt utilized nu-step on level 5 x 10 minutes for global strengthening & endurance training, coordination of reciprocal movements, & R NMR. Pt continues to be unable to pathfind to room & requires cuing to do so. At end of session pt left in recliner with chair alarm donned & husband present to supervise.    PT Discharge Precautions/Restrictions Precautions Precautions: Fall Precaution Comments: receptive & expressive language deficits Restrictions Weight Bearing Restrictions: No   Vision/Perception  Pt wears glasses at baseline - no changes in baseline vision. Perception WFL.   Cognition Overall Cognitive Status: Difficult to assess Arousal/Alertness: Awake/alert Orientation Level: Oriented X4 Sustained Attention: Impaired Memory: Impaired Memory Impairment: Decreased short term memory;Decreased recall of new information Awareness: Impaired Awareness Impairment: Intellectual impairment;Emergent impairment;Anticipatory impairment Problem  Solving: Impaired Behaviors: Impulsive Safety/Judgment: Impaired   Sensation Sensation  Light Touch: Not tested Proprioception: Not tested Coordination Gross Motor Movements are Fluid and Coordinated: Yes  Motor  Motor Motor: Abnormal postural alignment and control Motor - Discharge Observations: mild right side weakness, balance deficits, generalized deconditioning   Mobility Bed Mobility Bed Mobility: Rolling Right;Rolling Left;Supine to Sit;Sit to Supine Rolling Right: Independent Rolling Left: Independent Supine to Sit: Independent Sit to Supine: Independent Transfers Transfers: Sit to Stand;Stand to Sit Sit to Stand: Independent Stand to Sit: Independent Stand Pivot Transfers: Supervision/Verbal cueing Transfer (Assistive device): None   Locomotion  Gait Ambulation: Yes Gait Assistance: Supervision/Verbal cueing Gait Distance (Feet): 150 Feet Assistive device: None Gait Gait: Yes Gait Pattern: Impaired Gait Pattern: (slightly decreased weight shifting L) Stairs / Additional Locomotion Stairs: Yes Stairs Assistance: Supervision/Verbal cueing Stair Management Technique: One rail Right Number of Stairs: 12 Height of Stairs: 6(inches) Ramp: Supervision/Verbal cueing(ambulatory without AD) Wheelchair Mobility Wheelchair Mobility: No   Trunk/Postural Assessment  Cervical Assessment Cervical Assessment: (head forward) Thoracic Assessment Thoracic Assessment: (rounded shoulders) Lumbar Assessment Lumbar Assessment: (posterior pelvic preference) Postural Control Postural Control: Deficits on evaluation Righting Reactions: delayed   Balance Balance Balance Assessed: Yes Standardized Balance Assessment Standardized Balance Assessment: Berg Balance Test Berg Balance Test Sit to Stand: Able to stand without using hands and stabilize independently Standing Unsupported: Able to stand safely 2 minutes Sitting with Back Unsupported but Feet Supported on Floor  or Stool: Able to sit safely and securely 2 minutes Stand to Sit: Sits safely with minimal use of hands Transfers: Able to transfer safely, minor use of hands Standing Unsupported with Eyes Closed: Able to stand 10 seconds safely Standing Ubsupported with Feet Together: Able to place feet together independently and stand for 1 minute with supervision From Standing, Reach Forward with Outstretched Arm: Can reach forward >5 cm safely (2") From Standing Position, Pick up Object from Floor: Able to pick up shoe, needs supervision From Standing Position, Turn to Look Behind Over each Shoulder: Looks behind one side only/other side shows less weight shift Turn 360 Degrees: Able to turn 360 degrees safely but slowly Standing Unsupported, Alternately Place Feet on Step/Stool: Able to complete 4 steps without aid or supervision Standing Unsupported, One Foot in Front: Able to plae foot ahead of the other independently and hold 30 seconds Standing on One Leg: Tries to lift leg/unable to hold 3 seconds but remains standing independently Total Score: 43   Extremity Assessment  RUE Assessment RUE Assessment: Within Functional Limits LUE Assessment LUE Assessment: Within Functional Limits BLE: WFL   Waunita Schooner 05/18/2019, 11:52 AM

## 2019-05-18 NOTE — Progress Notes (Signed)
Speech Language Pathology Discharge Summary  Patient Details  Name: Cathy Harrington MRN: [id number] Date of Birth: 1946/07/23  Today's Date: 05/18/2019 SLP Individual Time: 1300-1400 SLP Individual Time Calculation (min): 60 min   Skilled Therapeutic Interventions:   Pt was seen for skilled ST intervention targeting goals for improved communication. SLP facilitated session by providing mod verbal cues for entries into pt notebook (started in prior ST session to facilitate recall of family names and information). Husband was present and assisted with this task, and was receptive to education regarding facilitating functional and effective communication and recall of information. Pt was left in bed with alarm on, all needs within reach. Discontinuing rehab speech therapy. Continued speech therapy is recommended on an outpatient basis.  Patient has met 2 of 2 long term goals.  Patient to discharge at overall Supervision level.   Clinical Impression/Discharge Summary:    Pt has exhibited significant improvement in effective communication skills in a short period of time. Today, pt was able to engage in conversation about family members, including names, children, phone numbers, birthdays, etc with assistance from her husband. Receptively, pt was able to answer yes/no questions accurately and follow 2 step verbal directions today. Expressively, pt was able to complete automatic sequences without perseveration, and name common objects around the room with min assist. More complex/abstract tasks continue problematic, and pt is now exhibiting more cognitively based impairments. These issues can be properly addressed on an outpatient basis.  Care Partner:  Caregiver Able to Provide Assistance: Yes  Type of Caregiver Assistance: Physical;Cognitive  Recommendation:  24 hour supervision/assistance;Outpatient SLP  Rationale for SLP Follow Up: Reduce caregiver burden;Maximize functional communication    Reasons for discharge: Discharged from hospital   Patient/Family Agrees with Progress Made and Goals Achieved: Yes    B. Quentin Ore, [facility], CCC-SLP Speech Language Pathologist  Shonna Chock 05/18/2019, 3:13 PM

## 2019-05-18 NOTE — Progress Notes (Signed)
Occupational Therapy Session Note  Patient Details  Name: Cathy Harrington MRN: [id number] Date of Birth: [birth year]  Today's Date: 05/18/2019 OT Individual Time: 1100-1135 OT Individual Time Calculation (min): 35 min    Short Term Goals: Week 1:  OT Short Term Goal 1 (Week 1): STG = LTG  Skilled Therapeutic Interventions/Progress Updates:   Pt received sitting in recliner with husband present. Pt completed 200 ft of functional mobility with distant (S) to ADL apartment. Pt stood at [employer] and engaged in word naming task and in meal planning. Min-moderate cueing required to correct read prices and to find correct words. Pt able to problem solve through a simple budget with min cueing. Pt then completed dynavision with dual processing component with only min cueing required. Reaction time overall was 2.29 seconds. Pt returned to room and was left sitting up with all needs met, husband present.    Therapy Documentation Precautions:  Precautions Precautions: Fall Precaution Comments: receptive & expressive language deficits Restrictions Weight Bearing Restrictions: No   Therapy/Group: Individual Therapy  Curtis Sites 05/18/2019, 7:18 AM

## 2019-05-19 ENCOUNTER — Other Ambulatory Visit: Payer: Self-pay | Admitting: Physical Medicine and Rehabilitation

## 2019-05-19 DIAGNOSIS — S069X2S Unspecified intracranial injury with loss of consciousness of 31 minutes to 59 minutes, sequela: Secondary | ICD-10-CM

## 2019-05-19 LAB — GLUCOSE, CAPILLARY: Glucose-Capillary: 122 mg/dL — ABNORMAL HIGH (ref 70–99)

## 2019-05-19 MED ORDER — VALPROIC ACID 250 MG PO CAPS: 250.0000 mg | ORAL_CAPSULE | Freq: Three times a day (TID) | ORAL | 1 refills | Status: DC

## 2019-05-19 MED ORDER — ACETAMINOPHEN 325 MG PO TABS: 325.0000 mg | ORAL_TABLET | ORAL | Status: DC | PRN

## 2019-05-19 MED ORDER — LEVETIRACETAM 1000 MG PO TABS: 1000.0000 mg | ORAL_TABLET | Freq: Two times a day (BID) | ORAL | 1 refills | Status: DC

## 2019-05-19 MED ORDER — METFORMIN HCL 500 MG PO TABS: 500.0000 mg | ORAL_TABLET | Freq: Two times a day (BID) | ORAL | 1 refills | Status: DC

## 2019-05-19 MED ORDER — LIDOCAINE 5 % EX PTCH: MEDICATED_PATCH | CUTANEOUS | 0 refills | Status: DC

## 2019-05-19 NOTE — Care Management (Signed)
Inpatient [facility] Individual Statement of Services  Patient Name:  Cathy Harrington  Date:  05/16/2019  Welcome to the [facility].  Our goal is to provide you with an individualized program based on your diagnosis and situation, designed to meet your specific needs.  With this comprehensive rehabilitation program, you will be expected to participate in at least 3 hours of rehabilitation therapies Monday-Friday, with modified therapy programming on the weekends.  Your rehabilitation program will include the following services:  Physical Therapy (PT), Occupational Therapy (OT), Speech Therapy (ST), 24 hour per day rehabilitation nursing, Therapeutic Recreaction (TR), Neuropsychology, Case Management (Social Worker), Rehabilitation Medicine, Nutrition Services and Pharmacy Services  Weekly team conferences will be held on Tuesdays to discuss your progress.  Your Social Worker will talk with you frequently to get your input and to update you on team discussions.  Team conferences with you and your family in attendance may also be held.  Expected length of stay: 5-7 days   Overall anticipated outcome: supervision  Depending on your progress and recovery, your program may change. Your Social Worker will coordinate services and will keep you informed of any changes. Your Social Worker's name and contact numbers are listed  below.  The following services may also be recommended but are not provided by the [facility] will be made to provide these services after discharge if needed.  Arrangements include referral to agencies that provide these services.  Your insurance has been verified to be:  Medicare; [place] primary doctor is:    Pertinent information will be shared with your doctor and your insurance company.  Social Worker:   [place], [place] or ([phone]   Information discussed with and copy given to patient by: Lennart Pall, 05/16/2019, 8:45 AM

## 2019-05-19 NOTE — Progress Notes (Signed)
Social Work Discharge Note   The overall goal for the admission was met for:   Discharge location: Yes - home with spouse and adult daughter  Length of Stay: Yes - 7 days  Discharge activity level: Yes - supervision  Home/community participation: Yes  Services provided included: MD, RD, PT, OT, SLP, RN, Pharmacy and SW  Financial Services: Medicare and Private Insurance: [place]  Follow-up services arranged: Outpatient: PT, OT, ST via [facility], DME: tub seat via [place] and Patient/Family has no preference for HH/DME agencies  Comments (or additional information):       Contact info:  Raechel Chute Elma Shands @ [phone]  Patient/Family verbalized understanding of follow-up arrangements: Yes  Individual responsible for coordination of the follow-up plan: pt  Confirmed correct DME delivered: Honey Zakarian, [place] Nick 05/19/2019    Koury Roddy

## 2019-05-19 NOTE — Discharge Instructions (Signed)
Inpatient Rehab Discharge Instructions  Cathy Harrington Discharge date and time:  05/19/19   Activities/Precautions/ Functional Status: Activity: no lifting, driving, or strenuous exercise till cleared by MD Diet: cardiac diet Wound Care:   Functional status:  ___ No restrictions     ___ Walk up steps independently _X__ 24/7 supervision/assistance   ___ Walk up steps with assistance ___ Intermittent supervision/assistance  ___ Bathe/dress independently ___ Walk with walker     ___ Bathe/dress with assistance ___ Walk Independently    ___ Shower independently ___ Walk with assistance    _X__ Shower with supervision  _X__ No alcohol     ___ Return to work/school ________   COMMUNITY REFERRALS UPON DISCHARGE:    Outpatient: PT     OT    ST                   Agency:  [facility] Phone:  [phone]                Appointment Date/Time:  7/22 @ 9:15 (please arrive by 9:00 am) - you will be there until 12:00 pm for all 3 therapies to evaluate  Medical Equipment/Items Ordered:  Tub seat                                                       Agency/Supplier:  [facility] @ [phone]  Special Instructions:   My questions have been answered and I understand these instructions. I will adhere to these goals and the provided educational materials after my discharge from the hospital.  Patient/Caregiver Signature _______________________________ Date __________  Clinician Signature _______________________________________ Date __________  Please bring this form and your medication list with you to all your follow-up doctor's appointments.

## 2019-05-19 NOTE — Patient Care Conference (Signed)
Inpatient RehabilitationTeam Conference and Plan of Care Update Date: 05/17/2019   Time: 2:35 PM    Patient Name: Cathy Harrington      Medical Record Number: [id number]  Date of Birth: 1946-09-26 Sex: Female         Room/Bed: 4W21C/4W21C-01 Payor Info: Payor: MEDICARE / Plan: MEDICARE PART A AND B / Product Type: *No Product type* /    Admitting Diagnosis: 2. TBI Team  Closed TBI; 12-14days  Admit Date/Time:  05/12/2019  2:52 PM Admission Comments: No comment available   Primary Diagnosis:  <principal problem not specified> Principal Problem: <principal problem not specified>  Patient Active Problem List   Diagnosis Date Noted  . Diabetes mellitus type 2 in nonobese (HCC)   . Transaminitis   . Chronic bilateral low back pain without sciatica   . TBI (traumatic brain injury) ([place]) 05/12/2019  . Aphasia 05/08/2019  . Diabetes mellitus type 2 in obese ([place]) 05/08/2019  . Hypothyroidism (acquired) 05/08/2019  . Altered mental status   . Pyrexia   . Seizure ([place])   . Encephalitis   . Seizures ([place]) 04/24/2016  . Acute respiratory failure ([place]) 04/24/2016    Expected Discharge Date: Expected Discharge Date: 05/19/19  Team Members Present: Physician leading conference: Dr. Alger Simons Social Worker Present: Lennart Pall, LCSW Nurse Present: Mohammed Kindle, RN PT Present: Lavone Nian, PT OT Present: Mariane Masters, OT SLP Present: Weston Anna, SLP PPS Coordinator present : Gunnar Fusi, SLP     Current Status/Progress Goal Weekly Team Focus  Medical   TBI, improving language,  but still with concentration and memory deficits. pain controlled. seizures  improve gait stability and concentration  TBI considerations//education, adjusting anticonvulsant regimen   Bowel/Bladder   Continent of bowel and bladder LBM 05/14/19  Continue to remain continent of bowel and bladder  assess qshift/prn   Swallow/Nutrition/ Hydration             ADL's   S-CGA mobilty and ADLs   Supervision  safety awareness, cognition, ADL retraining, endurance, balance, R NMR   Mobility   close supervision gait without AD, CGA stairs with R rail, supervision transfers, 37/56 Berg Balance Test on 7/11  supervision overall except independent bed mobility  R NMR, balance, endurance, stair negotiation, gait   Communication   Supervision-Min A  Supervision-Min A  Family Education   Safety/Cognition/ Behavioral Observations            Pain   No complaints of pain  remain free of pain  assess qshift/prn   Skin   No skin issues  Remain free of skin breakdown and infection  assess qshift/prn    Rehab Goals Patient on target to meet rehab goals: Yes *See Care Plan and progress notes for long and short-term goals.     Barriers to Discharge  Current Status/Progress Possible Resolutions Date Resolved   Physician    Medical stability        see medical progress notes      Nursing                  PT                    OT                  SLP                SW  Discharge Planning/Teaching Needs:  Pt to d/c home with spouse and adult daughter.  Spouse to provide supervision.  Spouse is here daily and receiving ongoing ed.   Team Discussion:  Doing well medically;  Adjusting sz meds.  Cont b/b.  Supervision with mobility and ADLs.  Improved word finding and decreased perserveration.  Recommend OP tx .  Revisions to Treatment Plan:  NA    Continued Need for Acute Rehabilitation Level of Care: The patient requires daily medical management by a physician with specialized training in physical medicine and rehabilitation for the following conditions: Daily direction of a multidisciplinary physical rehabilitation program to ensure safe treatment while eliciting the highest outcome that is of practical value to the patient.: Yes Daily medical management of patient stability for increased activity during participation in an intensive rehabilitation regime.: Yes Daily  analysis of laboratory values and/or radiology reports with any subsequent need for medication adjustment of medical intervention for : Neurological problems   I attest that I was present, lead the team conference, and concur with the assessment and plan of the team.   Lennart Pall 05/19/2019, 8:51 AM   Team conference was held via web/ teleconference due to [place] - 19

## 2019-05-19 NOTE — Plan of Care (Signed)
  Problem: Consults Goal: RH BRAIN INJURY PATIENT EDUCATION Description: Description: See Patient Education module for eduction specifics Outcome: Completed/Met Goal: Skin Care Protocol Initiated - if Braden Score 18 or less Description: If consults are not indicated, leave blank or document N/A Outcome: Completed/Met Goal: Diabetes Guidelines if Diabetic/Glucose > 140 Description: If diabetic or lab glucose is > 140 mg/dl - Initiate Diabetes/Hyperglycemia Guidelines & Document Interventions  Outcome: Completed/Met   Problem: RH BOWEL ELIMINATION Goal: RH STG MANAGE BOWEL WITH ASSISTANCE Description: STG Manage Bowel with min Assistance. Outcome: Completed/Met Goal: RH STG MANAGE BOWEL W/MEDICATION W/ASSISTANCE Description: STG Manage Bowel with Medication with min Assistance. Outcome: Completed/Met   Problem: RH BLADDER ELIMINATION Goal: RH STG MANAGE BLADDER WITH ASSISTANCE Description: STG Manage Bladder With min Assistance Outcome: Completed/Met Goal: RH STG MANAGE BLADDER WITH MEDICATION WITH ASSISTANCE Description: STG Manage Bladder With Medication With min Assistance. Outcome: Completed/Met Goal: RH STG MANAGE BLADDER WITH EQUIPMENT WITH ASSISTANCE Description: STG Manage Bladder With Equipment With min Assistance Outcome: Completed/Met   Problem: RH SKIN INTEGRITY Goal: RH STG SKIN FREE OF INFECTION/BREAKDOWN Description: Skin will be free of infection/breakdown with min assist Outcome: Completed/Met Goal: RH STG MAINTAIN SKIN INTEGRITY WITH ASSISTANCE Description: STG Maintain Skin Integrity With min Assistance. Outcome: Completed/Met   Problem: RH SAFETY Goal: RH STG ADHERE TO SAFETY PRECAUTIONS W/ASSISTANCE/DEVICE Description: STG Adhere to Safety Precautions With min Assistance/Device. Outcome: Completed/Met Goal: RH STG DECREASED RISK OF FALL WITH ASSISTANCE Description: STG Decreased Risk of Fall With min Assistance. Outcome: Completed/Met   Problem: RH  PAIN MANAGEMENT Goal: RH STG PAIN MANAGED AT OR BELOW PT'S PAIN GOAL Description: Less than 3 out of 10 Outcome: Completed/Met   Problem: RH KNOWLEDGE DEFICIT BRAIN INJURY Goal: RH STG INCREASE KNOWLEDGE OF SELF CARE AFTER BRAIN INJURY Description: Pt will be able to verbalize safety precautions in home Outcome: Completed/Met

## 2019-05-19 NOTE — Progress Notes (Signed)
[facility] PHYSICAL MEDICINE & REHABILITATION PROGRESS NOTE  Subjective/Complaints: Up in bed. No new complaints. Excited about discharge. Denies pain.   ROS: Patient denies fever, rash, sore throat, blurred vision, nausea, vomiting, diarrhea, cough, shortness of breath or chest pain, joint or back pain, headache, or mood change.    Objective: Vital Signs: Blood pressure (!) 142/75, pulse 87, temperature 97.6 F (36.4 C), temperature source Oral, resp. rate 16, height 5\' 4"  (1.626 m), weight 78.3 kg, SpO2 97 %. No results found. Recent Labs    05/18/19 0613  WBC 5.6  HGB 13.5  HCT 42.8  PLT 212   Recent Labs    05/16/19 1126 05/18/19 0613  NA 139 139  K 4.0 4.4  CL 102 101  CO2 28 26  GLUCOSE 174* 170*  BUN 23 25*  CREATININE 0.91 0.85  CALCIUM 9.6 9.5    Physical Exam: BP (!) 142/75 (BP Location: Left Arm)   Pulse 87   Temp 97.6 F (36.4 C) (Oral)   Resp 16   Ht 5\' 4"  (1.626 m)   Wt 78.3 kg   SpO2 97%   BMI 29.64 kg/m  Constitutional: No distress . Vital signs reviewed. HEENT: EOMI, oral membranes moist Neck: supple Cardiovascular: RRR without murmur. No JVD    Respiratory: CTA Bilaterally without wheezes or rales. Normal effort    GI: BS +, non-tender, non-distended  Musc: No edema or tenderness in extremities. Neurologic:  Alert Remains distracted, tangential. Word finding deficits at times.  Motor: Grossly 4+/5 throughout. Good sitting balance Psych: pleasant and cooperative  Assessment/Plan: 1. 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  Physiatrist is providing close team supervision and 24 hour management of active medical problems listed below.  Physiatrist and rehab team continue to assess barriers to discharge/monitor patient progress toward functional and medical goals  Care Tool:  Bathing    Body parts bathed by patient: Right  arm, Left arm, Chest, Abdomen, Front perineal area, Buttocks, Right upper leg, Left upper leg, Face, Right lower leg, Left lower leg   Body parts bathed by helper: Right lower leg, Left lower leg, Buttocks     Bathing assist Assist Level: Supervision/Verbal cueing     Upper Body Dressing/Undressing Upper body dressing   What is the patient wearing?: Bra, Pull over shirt    Upper body assist Assist Level: Independent with assistive device    Lower Body Dressing/Undressing Lower body dressing      What is the patient wearing?: Underwear/pull up, Pants     Lower body assist Assist for lower body dressing: Supervision/Verbal cueing     Toileting Toileting    Toileting assist Assist for toileting: Set up assist     Transfers Chair/bed transfer  Transfers assist     Chair/bed transfer assist level: Supervision/Verbal cueing     Locomotion Ambulation   Ambulation assist      Assist level: Supervision/Verbal cueing Assistive device: No Device Max distance: >150 ft   Walk 10 feet activity   Assist     Assist level: Supervision/Verbal cueing Assistive device: No Device   Walk 50 feet activity   Assist    Assist level: Supervision/Verbal cueing Assistive device: No Device    Walk 150 feet activity   Assist Walk 150 feet activity did not occur: Safety/medical concerns  Assist level: Supervision/Verbal cueing Assistive device: No Device    Walk 10 feet on  uneven surface  activity   Assist     Assist level: Contact Guard/Touching assist Assistive device: (none)   Wheelchair     Assist Will patient use wheelchair at discharge?: No Type of Wheelchair: Manual Wheelchair activity did not occur: N/A(pt to d/c at ambulatory level)  Wheelchair assist level: Supervision/Verbal cueing Max wheelchair distance: 50 ft    Wheelchair 50 feet with 2 turns activity    Assist    Wheelchair 50 feet with 2 turns activity did not occur:  N/A       Wheelchair 150 feet activity     Assist Wheelchair 150 feet activity did not occur: N/A          Medical Problem List and Plan: 1. Decline in functional mobility and ADLs secondary to TBI and seizures  Dc today  -Patient to see Rehab MD/provider in the office for transitional care encounter in 1-2 weeks.  2. Antithrombotics: -DVT/anticoagulation:Pharmaceutical:Lovenox -antiplatelet therapy: N/A 3.Chronic back pain/Pain Management:Tylenol as needed  Controlled on 7/16    4. Mood:LCSW tofollow for evaluation and support when appropriate. -antipsychotic agents: N/A 5. Neuropsych: This patient is notcapable of making decisions on herown behalf. 6. Skin/Wound Care:Routine pressure relief measures. 7. Fluids/Electrolytes/Nutrition:Monitor I/Os.   I personally reviewed the patient's labs today.    -BUN sl elevated---push po fluids 8.T2DM: Hemoglobin A1c 7.1. Monitor blood sugars AC at bedtime. Resumed metformin at 500 mg bid ac.   Sugars improved--follow up as outpt 9.Seizures: Continue Keppra 1000mg   twice daily  Depakote level supratherapeutic, decreased to 250mg  TID 7/14  -recheck level as outpt in next 1-2 weeks  10.  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  Mild transaminitis: resolved  ALT and all LFT's normal 7/13    LOS: 7 days A FACE TO [place] 05/19/2019, 10:14 AM
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
## 2019-05-23 ENCOUNTER — Telehealth: Payer: Self-pay | Admitting: Registered Nurse

## 2019-05-23 NOTE — Telephone Encounter (Signed)
Transitional Call Placed, no answer. Left message to return the call.

## 2019-05-24 NOTE — Progress Notes (Signed)
Patient and husband here for family education regarding daughter. She reports that she was on levothyroxine 88 mcg daily at home (was on 0.1 mg last year). Husband reports that dose has been changed multiple times.  I recommended that she return to her home dose. Have contacted Dr. Krista Blue office for input.

## 2019-05-25 ENCOUNTER — Ambulatory Visit: Payer: Medicare Other | Admitting: Physical Therapy

## 2019-05-25 ENCOUNTER — Other Ambulatory Visit: Payer: Self-pay

## 2019-05-25 ENCOUNTER — Ambulatory Visit: Payer: Medicare Other | Admitting: Speech Pathology

## 2019-05-25 ENCOUNTER — Encounter: Payer: Self-pay | Admitting: Occupational Therapy

## 2019-05-25 ENCOUNTER — Encounter: Payer: Self-pay | Admitting: Speech Pathology

## 2019-05-25 ENCOUNTER — Ambulatory Visit: Payer: Medicare Other | Attending: Physician Assistant | Admitting: Occupational Therapy

## 2019-05-25 ENCOUNTER — Encounter: Payer: Self-pay | Admitting: Physical Therapy

## 2019-05-25 DIAGNOSIS — R4701 Aphasia: Secondary | ICD-10-CM | POA: Diagnosis present

## 2019-05-25 DIAGNOSIS — M6281 Muscle weakness (generalized): Secondary | ICD-10-CM | POA: Insufficient documentation

## 2019-05-25 DIAGNOSIS — R262 Difficulty in walking, not elsewhere classified: Secondary | ICD-10-CM | POA: Insufficient documentation

## 2019-05-25 DIAGNOSIS — R2681 Unsteadiness on feet: Secondary | ICD-10-CM | POA: Diagnosis present

## 2019-05-25 DIAGNOSIS — R278 Other lack of coordination: Secondary | ICD-10-CM | POA: Insufficient documentation

## 2019-05-25 NOTE — Therapy (Signed)
[place] MAIN [facility] SERVICES [address] [place], [place], [address] Phone: [phone]   Fax:  [phone]  Speech Language Pathology Evaluation  Patient Details  Name: THAO BAUZA MRN: [id number] Date of Birth: Sep 21, 1946 Referring Provider (SLP): Lauraine Rinne   Encounter Date: 05/25/2019  End of Session - 05/25/19 1633    Visit Number  1    Number of Visits  17    Date for SLP Re-Evaluation  07/26/19    Authorization Type  Medicare    Authorization Time Period  Start 05/25/2019    Authorization - Visit Number  1    Authorization - Number of Visits  10    SLP Start Time  1100    SLP Stop Time   1200    SLP Time Calculation (min)  60 min    Activity Tolerance  Patient tolerated treatment well       Past Medical History:  Diagnosis Date  . Diabetes mellitus without complication ([place])   . 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 Hypothyroidism   . Thyroid disease     Past Surgical History:  Procedure Laterality Date  . BACK SURGERY  2000  . BREAST SURGERY    . COLONOSCOPY    . COLONOSCOPY WITH PROPOFOL N/A 01/19/2018   Procedure: COLONOSCOPY WITH PROPOFOL;  Surgeon: Lollie Sails, MD;  Location: [facility] ENDOSCOPY;  Service: Endoscopy;  Laterality: N/A;  . parathyroidectomy      There were no vitals filed for this visit.      SLP Evaluation [facility] - 05/25/19 1625      SLP Visit Information   SLP Received On  05/25/19    Referring Provider (SLP)  Lauraine Rinne    Onset Date  05/08/2019    Medical Diagnosis  TIA      Subjective   Subjective  "I can't remember names"    Patient/Family Stated Goal  Maximize functional communication      Pain Assessment   Currently in Pain?  No/denies      General Information   HPI  Leafy Motsinger is a 73 year old woman admitted to [facility] for TIA 05/08/2019.  MRI 05/10/2019:  IMPRESSION: 1. No acute intracranial abnormality. 2. 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  3. Otherwise unremarkable brain MRI for age.  Patient received hospital and inpatient rehab SLP services 05/08/2019 - 05/18/2019. Documentation shows improvement over the course of treatment.  The patient received speech therapy at this facility 10/12/2018 - 11/23/2018 for cognitive communication deficits.      Prior Functional Status   Cognitive/Linguistic Baseline  Baseline deficits    Baseline deficit details  memory issues and difficulty with word finding per admission history and husband report      Auditory Comprehension   Overall Auditory Comprehension  Impaired    Yes/No Questions  Impaired    Commands  Impaired    Overall Auditory Comprehension Comments  Hear of hearing      Reading Comprehension   Reading Status  Not tested      Expression   Primary Mode of Expression  Verbal      Verbal Expression   Overall Verbal Expression  Impaired    Repetition  Impaired    Naming  Impairment      Oral  Motor/Sensory Function   Overall Oral Motor/Sensory Function  Appears within functional limits for tasks assessed      Motor Speech   Overall Motor Speech  Appears within functional limits for tasks assessed      Standardized Assessments   Standardized Assessments   Western Aphasia Battery revised        Western Aphasia Battery- Revised  Spontaneous Speech      Information content  7/10       Fluency   8/10      Comprehension     Yes/No questions  57/60        Auditory Word Recognition 53/60        Sequential Commands 44/80     Repetition   62/100      Naming    Object Naming  19/60        Word Fluency   4/20        Sentence Completion 5/10        Responsive Speech   8/10      Aphasia Quotient  65/100    SLP Education - 05/25/19 1632    Education Details  Results and recommendations    Person(s) Educated   Patient    Methods  Explanation    Comprehension  Verbalized understanding         SLP Long Term Goals - 05/25/19 1636      SLP LONG TERM GOAL #1   Title  Patient will name common objects with 80% accuracy.    Time  8    Period  Weeks    Status  New    Target Date  07/26/19      SLP LONG TERM GOAL #2   Title  Patient will generate grammatical and cogent sentence to complete simple/concrete linguistic task with 80% accuracy.    Time  8    Period  Weeks    Status  New    Target Date  07/26/19      SLP LONG TERM GOAL #3   Title  Patient will complete moderate auditory attention/vigilance/memory tasks with 80% accuracy.    Time  8    Period  Weeks    Status  New    Target Date  07/26/19      SLP LONG TERM GOAL #4   Title  Patient will write grammatical and cogent phrase to complete simple/concrete linguistic task with 80% accuracy.    Time  8    Period  Weeks    Status  New    Target Date  07/26/19      SLP LONG TERM GOAL #5   Title  Patient will demonstrate reading comprehension for sentences with 80% accuracy.    Time  8    Period  Weeks    Status  New    Target Date  07/26/19       Plan - 05/25/19 1634    Clinical Impression Statement  This 73 year old woman, with chronic encephalomalacia involving the anterior left temporal lobe and left cingulate gyrus, is presenting with moderate aphasia characterized by expressive aphasia (reduced information content of spontaneous speech, circumlocutory, relatively fluent speech with anomia and paraphasias); receptive aphasia (accurate comprehension of words given support for hearing loss / slowed auditory processing with significant difficulty with lengthy/complex language); and impaired repetition skills.    On the Western Aphasia Battery- Revised, the patient scored an Aphasia Quotient of 65/100.  In December 2019 the patient scored a Screening  Aphasia Quotient of 87/100.  This represents a significant loss in language skills,  primarily word finding. The patient will benefit from skilled speech therapy for restorative and compensatory treatment of aphasia with focus on functional communication.    Speech Therapy Frequency  2x / week    Duration  Other (comment)   8 weeks   Treatment/Interventions  Language facilitation;Compensatory techniques;Multimodal communcation approach;Patient/family education    Potential to Achieve Goals  Fair    Potential Considerations  Ability to learn/carryover information;Previous level of function;Severity of impairments;Cooperation/participation level;Family/community support    Consulted and Agree with Plan of Care  Patient       Patient will benefit from skilled therapeutic intervention in order to improve the following deficits and impairments:   1. Aphasia       Problem List Patient Active Problem List   Diagnosis Date Noted  . Diabetes mellitus type 2 in nonobese (HCC)   . Transaminitis   . Chronic bilateral low back pain without sciatica   . TBI (traumatic brain injury) ([place]) 05/12/2019  . Aphasia 05/08/2019  . Diabetes mellitus type 2 in obese ([place]) 05/08/2019  . Hypothyroidism (acquired) 05/08/2019  . Altered mental status   . Pyrexia   . Seizure ([place])   . Encephalitis   . Seizures ([place]) 04/24/2016  . Acute respiratory failure ([place]) 04/24/2016   [name], [name]  Lou Miner 05/25/2019, 4:43 PM  [place] MAIN [facility] SERVICES [address] [place], [place], [address] Phone: [phone]   Fax:  [phone]  Name: EMOREE SASAKI MRN: [id number] Date of Birth: 05/12/1946

## 2019-05-25 NOTE — Therapy (Signed)
[place] MAIN [facility] SERVICES [address] [place], [place], [address] Phone: [phone]   Fax:  [phone]  Physical Therapy Evaluation  Patient Details  Name: Cathy Harrington MRN: [id number] Date of Birth: 1946/09/03 Referring Provider (PT): Lauraine Rinne PA   Encounter Date: 05/25/2019  PT End of Session - 05/25/19 1256    Visit Number  1    Number of Visits  5    Date for PT Re-Evaluation  06/22/19    PT Start Time  1016    PT Stop Time  1055    PT Time Calculation (min)  39 min    Equipment Utilized During Treatment  Gait belt    Activity Tolerance  Patient tolerated treatment well    Behavior During Therapy  [facility] for tasks assessed/performed       Past Medical History:  Diagnosis Date  . Diabetes mellitus without complication ([place])   . 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 Hypothyroidism   . Thyroid disease     Past Surgical History:  Procedure Laterality Date  . BACK SURGERY  2000  . BREAST SURGERY    . COLONOSCOPY    . COLONOSCOPY WITH PROPOFOL N/A 01/19/2018   Procedure: COLONOSCOPY WITH PROPOFOL;  Surgeon: Lollie Sails, MD;  Location: [facility] ENDOSCOPY;  Service: Endoscopy;  Laterality: N/A;  . parathyroidectomy      There were no vitals filed for this visit.   Subjective Assessment - 05/25/19 1023    Subjective  "I still have trouble remembering and speaking."    Pertinent History  73 yo Female reports 2 weeks ago she was cooking and she reports that while she was cooking a pot of rice fell and she was trying to put it in the sink and became disoriented and was unable to call her husbands name or tell him what was wrong. Her husband brought her to the emergency room. She was transferred to [facility] and was doing well, being able to walk with supervision without AD. She does report having  difficulty speaking with impaired memory. She denies any recent falls. She denies any numbness/tingling; Patient does speak arabic as her primary language but does understand most [place]; She reports 3 years ago she was sick and reports since then she was having trouble with her memory and word finding.Patient has a history of type II diabetes, thyroid disease (TSH), viral encephalitis, HLD, Insomnia, overweight, DDD lumbar, memory deficits.    How long can you sit comfortably?  NA    How long can you stand comfortably?  approximately 20-30 min, does get fatigue does continue to have some back pain;    How long can you walk comfortably?  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    Patient Stated Goals  used to walk 30 min daily and would like to get back to walking;    Currently in Pain?  No/denies    Pain Score  0-No pain    Multiple Pain Sites  No         [facility] PT Assessment - 05/25/19 1036      Assessment   Medical Diagnosis  TBI/TIA    Referring Provider (PT)  Lauraine Rinne PA  Onset Date/Surgical Date  05/07/19    Hand Dominance  Right    Prior Therapy  had PT in Dec 2019 for low back pain with good results; was in [facility] for 2 weeks for current event with good results;       Precautions   Precautions  Fall    Required Braces or Orthoses  --   none     Restrictions   Weight Bearing Restrictions  No      Balance Screen   Has the patient fallen in the past 6 months  No    Has the patient had a decrease in activity level because of a fear of falling?   No    Is the patient reluctant to leave their home because of a fear of falling?   No      Home Environment   Living Environment  Private residence    Living Arrangements  Spouse/significant other;Children    Available Help at Discharge  Family    Type of [place] Access   Level entry    [place] to live on main level with bedroom/bathroom;Laundry or work area in Production assistant, radio - 2 wheels      Prior Function   Level of [place]  Retired    Leisure  walking the neighborhood, likes to exercise at the [employer]   Overall Cognitive Status  Difficult to assess      Sensation   Light Touch  Appears Intact    Proprioception  Appears Intact      Coordination   Gross Motor Movements are Fluid and Coordinated  Yes      AROM   Overall AROM Comments  BLE are [facility]      Strength   Right Hip Flexion  4+/5    Left Hip Flexion  4+/5    Right Knee Flexion  4+/5    Right Knee Extension  4+/5    Left Knee Flexion  4+/5    Left Knee Extension  4+/5    Right Ankle Dorsiflexion  4+/5    Left Ankle Dorsiflexion  4+/5      Transfers   Comments  able to transfer sit<>stand without pushing on chair with good safety and positioning;      Ambulation/Gait   Gait Comments  ambulates with reciprocal gait pattern, slightly widened base of support, with good foot clearance, good gait speed;      Standardized Balance Assessment   Five times sit to stand comments   16.8 sec without HHA (>15 sec indicates high fall risk)    10 Meter Walk  1.02 m/s without AD, community ambulator      High Level Balance   High Level Balance Comments  Able to stand firm and foam surface with eyes closed, no sway; able to achieve and hold tandem stance for 8 sec with close supervision, eyes closed: immediate loss of balance; unable to hold SLS;                 Objective measurements completed on examination: See above findings.              PT Education - 05/25/19 1256    Education Details  plan of care/recommendations    Person(s) Educated  Patient    Methods  Explanation    Comprehension  Verbalized understanding       PT Short Term Goals - 05/25/19 1456      PT SHORT TERM GOAL #1   Title   Patient will be adherent to HEP at least 3x a week to improve functional strength and balance for better safety at home.    Time  3    Period  Weeks    Status  New    Target Date  06/08/19      PT SHORT TERM GOAL #2   Title  Patient (> 61 years old) will complete five times sit to stand test in < 15 seconds indicating an increased LE strength and improved balance.    Time  2    Period  Weeks    Status  New    Target Date  06/08/19        PT Long Term Goals - 05/25/19 1457      PT LONG TERM GOAL #1   Title  Patient will increase six minute walk test distance to >1000 for progression to community ambulator and improve gait ability    Time  4    Period  Weeks    Status  New    Target Date  06/22/19      PT LONG TERM GOAL #2   Title  Patient will tolerate 5 seconds of single leg stance without loss of balance to improve ability to get in and out of shower safely.    Time  4    Period  Weeks    Status  New    Target Date  06/22/19      PT LONG TERM GOAL #3   Title  Patient will demonstrate an improved Berg Balance Score of > 48/56 as to demonstrate improved balance with ADLs such as sitting/standing and transfer balance and reduced fall risk.    Time  4    Period  Weeks    Status  New    Target Date  06/22/19             Plan - 05/25/19 1450    Clinical Impression Statement  73 yo Female s/p TBI/TIA with onset of confusion and inability to speak on 05/07/19. Patient has a history of encephalitis several years ago with resultant memory loss. She reports having difficulty remembering names and word finding as her primary complaint. She exhibits good strength in BLE and good ROM in BLE. She is able to transfer sit<>Stand without pushing up on chair with good safety awareness and control. Patient does ambulate with good speed with reciprocal gait pattern, slight wider base of support but good dynamic control. She does exhibit some balance deficits with positive sharpened rhomberg  and inability to hold SLS. She would benefit from skilled PT Intervention to address balance deficits and improve safety within the community. Patient is hoping to return to walking around her neighborhood like she did prior to recent event.    Personal Factors and Comorbidities  Age;Comorbidity 3+    Comorbidities  past encephalitis, memory deficits, high fall risk, DM type 2, obesity    Examination-Activity Limitations  Squat;Stairs;Stand;Locomotion Level;Caring for Others    Examination-Participation Restrictions  Cleaning;Community Activity;Driving;Laundry;Shop;Volunteer;Yard Work    Merchant navy officer  Stable/Uncomplicated    Designer, jewellery  Low    Rehab Potential  Good    PT Frequency  1x / week    PT Duration  4 weeks    PT Treatment/Interventions  Cryotherapy;Moist Heat;Gait training;Stair training;Functional mobility  training;Therapeutic activities;Therapeutic exercise;Balance training;Neuromuscular re-education;Patient/family education    PT Next Visit Plan  address HEP, do balance assessment such as FGA/Berg    PT Home Exercise Plan  will address next visit    Recommended Other Services  Speech therapy    Consulted and Agree with Plan of Care  Patient       Patient will benefit from skilled therapeutic intervention in order to improve the following deficits and impairments:  Decreased balance, Decreased endurance, Decreased mobility, Difficulty walking, Decreased activity tolerance, Decreased safety awareness  Visit Diagnosis: 1. Unsteadiness on feet   2. Difficulty in walking, not elsewhere classified        Problem List Patient Active Problem List   Diagnosis Date Noted  . Diabetes mellitus type 2 in nonobese (HCC)   . Transaminitis   . Chronic bilateral low back pain without sciatica   . TBI (traumatic brain injury) ([place]) 05/12/2019  . Aphasia 05/08/2019  . Diabetes mellitus type 2 in obese ([place]) 05/08/2019  . Hypothyroidism (acquired)  05/08/2019  . Altered mental status   . Pyrexia   . Seizure ([place])   . Encephalitis   . Seizures ([place]) 04/24/2016  . Acute respiratory failure ([place]) 04/24/2016    Tattianna Schnarr PT, DPT 05/25/2019, 2:58 PM  [place] MAIN [facility] SERVICES [address] [place], [place], [address] Phone: [phone]   Fax:  [phone]  Name: Cathy Harrington MRN: [id number] Date of Birth: Feb 05, 1946

## 2019-05-31 ENCOUNTER — Other Ambulatory Visit: Payer: Self-pay

## 2019-05-31 ENCOUNTER — Encounter: Payer: Self-pay | Admitting: Registered Nurse

## 2019-05-31 ENCOUNTER — Encounter: Payer: Medicare Other | Attending: Registered Nurse | Admitting: Registered Nurse

## 2019-05-31 VITALS — BP 112/74 | HR 83 | Temp 97.6°F | Ht 63.0 in | Wt 176.0 lb

## 2019-05-31 DIAGNOSIS — R4701 Aphasia: Secondary | ICD-10-CM

## 2019-05-31 DIAGNOSIS — G8929 Other chronic pain: Secondary | ICD-10-CM

## 2019-05-31 DIAGNOSIS — E1169 Type 2 diabetes mellitus with other specified complication: Secondary | ICD-10-CM

## 2019-05-31 DIAGNOSIS — E669 Obesity, unspecified: Secondary | ICD-10-CM | POA: Diagnosis present

## 2019-05-31 DIAGNOSIS — M545 Low back pain: Secondary | ICD-10-CM | POA: Diagnosis present

## 2019-05-31 DIAGNOSIS — R569 Unspecified convulsions: Secondary | ICD-10-CM | POA: Diagnosis present
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
## 2019-05-31 NOTE — Therapy (Signed)
[place] MAIN [facility] SERVICES [address] [place], [place], [address] Phone: [phone]   Fax:  [phone]  Occupational Therapy Evaluation  Patient Details  Name: Cathy Harrington MRN: [id number] Date of Birth: 04-15-1946 No data recorded  Encounter Date: 05/25/2019  OT End of Session - 05/31/19 1428    Visit Number  1    Number of Visits  12    Date for OT Re-Evaluation  07/05/19    OT Start Time  0915    OT Stop Time  1014    OT Time Calculation (min)  59 min    Activity Tolerance  Patient tolerated treatment well    Behavior During Therapy  [facility] for tasks assessed/performed       Past Medical History:  Diagnosis Date  . Diabetes mellitus without complication ([place])   . 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 Hypothyroidism   . Thyroid disease     Past Surgical History:  Procedure Laterality Date  . BACK SURGERY  2000  . BREAST SURGERY    . COLONOSCOPY    . COLONOSCOPY WITH PROPOFOL N/A 01/19/2018   Procedure: COLONOSCOPY WITH PROPOFOL;  Surgeon: Lollie Sails, MD;  Location: [facility] ENDOSCOPY;  Service: Endoscopy;  Laterality: N/A;  . parathyroidectomy      There were no vitals filed for this visit.  Subjective Assessment - 05/31/19 1422    Subjective   Patient reports she has been here before, she reports her speech is not back to normal yet.  She was in the hospital for 2 weeks, she was just discharged on Friday.    Pertinent History  73 yo Female reports 2 weeks ago she was cooking and she reports that while she was cooking a pot of rice fell and she was trying to put it in the sink and became disoriented and was unable to call her husbands name or tell him what was wrong. Her husband brought her to the emergency room. She was transferred to [facility] and was doing well, being able to walk with supervision  without AD. She does report having difficulty speaking with impaired memory. She denies any recent falls. She denies any numbness/tingling; Patient does speak arabic as her primary language but does understand most [place]; She reports 3 years ago she was sick and reports since then she was having trouble with her memory and word finding.Patient has a history of type II diabetes, thyroid disease (TSH), viral encephalitis, HLD, Insomnia, overweight, DDD lumbar, memory deficits.    Patient Stated Goals  To be independent again and take care of herself and her family. She would like to be able to speak again normally.    Currently in Pain?  No/denies    Pain Score  0-No pain        [facility] OT Assessment - 05/31/19 1424      Assessment   Medical Diagnosis  TBI    Hand Dominance  Right    Prior Therapy  IP rehab for 2 weeks      Precautions   Precautions  Fall      Restrictions   Weight Bearing Restrictions  No      Balance Screen   Has the patient fallen in the past 6 months  No    Has the patient had a decrease in  activity level because of a fear of falling?   No    Is the patient reluctant to leave their home because of a fear of falling?   No      Home  Environment   Family/patient expects to be discharged to:  Private residence    Living Arrangements  Spouse/significant other   daughter also lives with her   Available Help at Discharge  Family    Type of [place]  One level   has a basement with laundry in that area   [place]  Standard    Additional Comments  Has a daughter who recently broke her leg and lives with them.      Lives With  Spouse      Prior Function   Level of Independence  Independent    Vocation  Retired    Leisure  walking the neighborhood, likes to exercise at the [employer]      ADL   Eating/Feeding  Needs assist with cutting food    Grooming  Modified independent     Upper Body Bathing  Modified independent    [place] independent    [place] Transfer  Modified independent    [place] independent    [place] Transfer  Supervision/safety      IADL   Prior Level of Function Shopping  independent    Shopping  Needs to be accompanied on any shopping trip    Prior Level of Function Light Housekeeping  indpendent     Light Housekeeping  Does not participate in any housekeeping tasks    Prior Level of Function Meal Prep  independent    Meal Prep  Able to complete simple cold meal and snack prep    Prior Level of Function Community Mobility  independent    Community Mobility  Relies on family or friends for transportation    Prior Level of Function Medication Managment  independent    Medication Management  Takes responsibility if medication is prepared in advance in seperate dosage    Prior Level of Function Financial Management  independent    Financial Management  Requires assistance      Mobility   Mobility Status  Independent      Written Expression   Dominant Hand  Right    Handwriting  90% legible      Vision - History   Baseline Vision  Wears glasses all the time      Cognition   Overall Cognitive Status  Within Functional Limits for tasks assessed    Memory  Impaired    Memory Impairment  Decreased recall of new information;Decreased short term memory    Cognition Comments  Patient reports difficulty with recalling names, reading, impaired sequencing, difficulty recalling her phone number which may be attributed to her aphasia.        Sensation   Light Touch  Appears Intact    Stereognosis  Appears Intact    Hot/Cold  Appears Intact    Proprioception  Appears Intact      Coordination   Gross Motor Movements are Fluid and Coordinated  Yes    9 Hole Peg Test  Right;Left    Right [address] Peg Test  28 sec    Left 9 Hole Peg Test  28 sec    Coordination  mild coordination deficits with rapid alternating movements and finger to nose on right side.      Strength   Overall Strength Comments  RUE 4/5 overall, left 4+/5 overall      Hand Function   Right Hand Grip (lbs)  35    Right Hand Lateral Pinch  17 lbs    Right Hand 3 Point Pinch  12 lbs    Left Hand Grip (lbs)  35    Left Hand Lateral Pinch  16 lbs    Left 3 point pinch  13 lbs    Comment  2 point pinch right 8#, left 9#       Mild decreased balance during transitional movements, refer to PT eval for further balance details.                OT Education - 05/31/19 1428    Education Details  role of OT, goals, POC    Person(s) Educated  Patient    Methods  Explanation    Comprehension  Verbalized understanding          OT Long Term Goals - 05/31/19 1434      OT LONG TERM GOAL #1   Title  Patient will be independent with home exercise program for strengthening BUE.    Baseline  no current program    Time  6    Period  Weeks    Status  New    Target Date  07/05/19      OT LONG TERM GOAL #2   Title  Patient will improve grip strength by 5# to be able to complete cutting food with modified independence.    Baseline  requires assist with cutting some food, especially meat at eval    Time  6    Period  Weeks    Status  New    Target Date  07/05/19      OT LONG TERM GOAL #3   Title  Patient will complete shower transfer with modified independence.    Baseline  supervision at eval    Time  4    Period  Weeks    Status  New    Target Date  06/22/19      OT LONG TERM GOAL #4   Title  Patient will complete light meal preparation with modified independence.    Baseline  requires assist at eval for meal prep    Time  6    Period  Weeks    Status  New    Target Date  07/05/19      OT LONG TERM GOAL #5   Title  Patient will complete light homemaking tasks with modified independence.    Baseline   Currently not performing since hospitalization.    Time  6    Period  Weeks    Status  New    Target Date  07/05/19            Plan - 05/31/19 1429    Clinical Impression Statement  Patient is a 72 yo female who was diagnosed with a TBI/TIA in July 2020.  She was hospitalized at [facility] for 2 weeks where she also received IP rehab.  She was evaluated this date for OT and presents with muscle weakness, aphasia, impaired sequencing, decreased ability to perform select self care tasks, IADL tasks and mild balance deficits.  She would benefit from skilled OT services to maximize safety and independence in daily tasks.    OT Occupational Profile and History  Detailed Assessment- Review of Records and additional review of physical, cognitive, psychosocial history related to current functional performance    Occupational performance deficits (Please refer to evaluation for details):  ADL's;IADL's;Social Participation    Body Structure / Function / Physical Skills  ADL;Coordination;UE functional use;Balance;Decreased knowledge of use of DME;IADL;Dexterity;FMC;Strength    Cognitive Skills  Sequencing;Memory    Psychosocial Skills  Environmental  Adaptations;Routines and Behaviors;Habits    Rehab Potential  Excellent    Clinical Decision Making  Limited treatment options, no task modification necessary    Comorbidities Affecting Occupational Performance:  May have comorbidities impacting occupational performance    Modification or Assistance to Complete Evaluation   No modification of tasks or assist necessary to complete eval    OT Frequency  2x / week    OT Duration  6 weeks    OT Treatment/Interventions  Self-care/ADL training;Therapeutic exercise;Moist Heat;Neuromuscular education;Patient/family education;Therapeutic activities;Balance training;Cryotherapy;DME and/or AE instruction;Manual Therapy    Consulted and Agree with Plan of Care  Patient       Patient will benefit from skilled  therapeutic intervention in order to improve the following deficits and impairments:   Body Structure / Function / Physical Skills: ADL, Coordination, UE functional use, Balance, Decreased knowledge of use of DME, IADL, Dexterity, FMC, Strength Cognitive Skills: Sequencing, Memory Psychosocial Skills: Environmental  Adaptations, Routines and Behaviors, Habits   Visit Diagnosis: 1. Muscle weakness (generalized)   2. Other lack of coordination       Problem List Patient Active Problem List   Diagnosis Date Noted  . Diabetes mellitus type 2 in nonobese (HCC)   . Transaminitis   . Chronic bilateral low back pain without sciatica   . TBI (traumatic brain injury) ([place]) 05/12/2019  . Aphasia 05/08/2019  . Diabetes mellitus type 2 in obese ([place]) 05/08/2019  . Hypothyroidism (acquired) 05/08/2019  . Altered mental status   . Pyrexia   . Seizure ([place])   . Encephalitis   . Seizures ([place]) 04/24/2016  . Acute respiratory failure ([place]) 04/24/2016   Reeva Davern T Kashaun Bebo, OTR/L, CLT  Torion Hulgan 05/31/2019, 2:46 PM  [place] MAIN [facility] SERVICES [address] [place], [place], [address] Phone: [phone]   Fax:  [phone]  Name: TAMA GROSZ MRN: [id number] Date of Birth: 06-25-1946

## 2019-06-01 ENCOUNTER — Encounter: Payer: Self-pay | Admitting: Physical Therapy

## 2019-06-01 ENCOUNTER — Ambulatory Visit: Payer: Medicare Other | Admitting: Occupational Therapy

## 2019-06-01 ENCOUNTER — Ambulatory Visit: Payer: Medicare Other | Admitting: Physical Therapy

## 2019-06-01 DIAGNOSIS — M6281 Muscle weakness (generalized): Secondary | ICD-10-CM

## 2019-06-01 DIAGNOSIS — R278 Other lack of coordination: Secondary | ICD-10-CM

## 2019-06-01 DIAGNOSIS — R262 Difficulty in walking, not elsewhere classified: Secondary | ICD-10-CM

## 2019-06-01 DIAGNOSIS — R2681 Unsteadiness on feet: Secondary | ICD-10-CM

## 2019-06-01 LAB — VALPROIC ACID LEVEL: Valproic Acid Lvl: 52 ug/mL (ref 50–100)

## 2019-06-01 NOTE — Therapy (Signed)
[place] MAIN [facility] SERVICES [address] [place], [place], [address] Phone: [phone]   Fax:  [phone]  Physical Therapy Treatment  Patient Details  Name: Cathy Harrington MRN: [id number] Date of Birth: 08/20/1946 Referring Provider (PT): Lauraine Rinne PA   Encounter Date: 06/01/2019  PT End of Session - 06/01/19 1112    Visit Number  2    Number of Visits  5    Date for PT Re-Evaluation  06/22/19    PT Start Time  1146    PT Stop Time  1230    PT Time Calculation (min)  44 min    Equipment Utilized During Treatment  Gait belt    Activity Tolerance  Patient tolerated treatment well    Behavior During Therapy  [facility] for tasks assessed/performed       Past Medical History:  Diagnosis Date  . Diabetes mellitus without complication ([place])   . 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 Hypothyroidism   . Thyroid disease     Past Surgical History:  Procedure Laterality Date  . BACK SURGERY  2000  . BREAST SURGERY    . COLONOSCOPY    . COLONOSCOPY WITH PROPOFOL N/A 01/19/2018   Procedure: COLONOSCOPY WITH PROPOFOL;  Surgeon: Lollie Sails, MD;  Location: [facility] ENDOSCOPY;  Service: Endoscopy;  Laterality: N/A;  . parathyroidectomy      There were no vitals filed for this visit.  Subjective Assessment - 06/01/19 1146    Subjective  "I would like to be able to walk like I did before. My memory  is getting a little better, but I still have trouble remembering names." Patient denies any pain, denies any new falls;    Pertinent History  73 yo Female reports 2 weeks ago she was cooking and she reports that while she was cooking a pot of rice fell and she was trying to put it in the sink and became disoriented and was unable to call her husbands name or tell him what was wrong. Her husband brought her to the emergency room. She was  transferred to [facility] and was doing well, being able to walk with supervision without AD. She does report having difficulty speaking with impaired memory. She denies any recent falls. She denies any numbness/tingling; Patient does speak arabic as her primary language but does understand most [place]; She reports 3 years ago she was sick and reports since then she was having trouble with her memory and word finding.Patient has a history of type II diabetes, thyroid disease (TSH), viral encephalitis, HLD, Insomnia, overweight, DDD lumbar, memory deficits.    How long can you sit comfortably?  NA    How long can you stand comfortably?  approximately 20-30 min, does get fatigue does continue to have some back pain;    How long can you walk comfortably?  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    Patient Stated Goals  used to walk 30 min daily and would like to get back to walking;    Currently in Pain?  No/denies    Multiple Pain Sites  No         [facility] PT Assessment - 06/01/19 0001  6 Minute Walk- Baseline   BP (mmHg)  119/69    HR (bpm)  77    02 Sat (%RA)  98 %      6 Minute walk- Post Test   BP (mmHg)  147/67    HR (bpm)  85    02 Sat (%RA)  99 %      6 minute walk test results    Aerobic Endurance Distance Walked  1250    Endurance additional comments  without AD, good safety awareness, reports mild shortness of breath;       Berg Balance Test   Sit to Stand  Able to stand without using hands and stabilize independently    Standing Unsupported  Able to stand safely 2 minutes    Sitting with Back Unsupported but Feet Supported on Floor or Stool  Able to sit safely and securely 2 minutes    Stand to Sit  Sits safely with minimal use of hands    Transfers  Able to transfer safely, minor use of hands    Standing Unsupported with Eyes Closed   Able to stand 10 seconds safely    Standing Unsupported with Feet Together  Able to place feet together independently and stand 1 minute safely    From Standing, Reach Forward with Outstretched Arm  Can reach forward >12 cm safely (5")    From Standing Position, Pick up Object from Floor  Able to pick up shoe safely and easily    From Standing Position, Turn to Look Behind Over each Shoulder  Looks behind from both sides and weight shifts well    Turn 360 Degrees  Able to turn 360 degrees safely but slowly    Standing Unsupported, Alternately Place Feet on Step/Stool  Able to stand independently and safely and complete 8 steps in 20 seconds    Standing Unsupported, One Foot in Front  Able to plae foot ahead of the other independently and hold 30 seconds    Standing on One Leg  Tries to lift leg/unable to hold 3 seconds but remains standing independently    Total Score  49    Berg comment:  50% risk for falls        TREATMENT: PT instructed patient in Berg Balance Assessment and 6 min walk test see above:   Instructed patient in balance exercise as part of HEP: Sit<>Stand unsupported x10 reps with cues to use a steady chair for better safety awareness; Forward/backward walking x10 feet unsupported x3 laps; SLS 5 sec hold x5 reps each LE with intermittent HHA Tandem stance 10 sec hold x5 reps each foot in front, with intermittent HHA Patient required min VCs for balance stability, including to increase trunk control for less loss of balance with smaller base of support    Response to treatment: Patient required min VCS for correct activity technique with balance assessment. She does exhibit fair static standing balance but is unsteady with narrow base of support or with advanced challenge standing on compliant surfaces;  Initiated HEP with balance exercise to improve overall mobility and safety with walking.                    PT Education - 06/01/19 1112    Education  Details  balance/gait safety, HEP reinforced;    Person(s) Educated  Patient    Methods  Explanation;Verbal cues    Comprehension  Verbalized understanding;Returned demonstration;Verbal cues required;Need further instruction       PT  Short Term Goals - 05/25/19 1456      PT SHORT TERM GOAL #1   Title  Patient will be adherent to HEP at least 3x a week to improve functional strength and balance for better safety at home.    Time  3    Period  Weeks    Status  New    Target Date  06/08/19      PT SHORT TERM GOAL #2   Title  Patient (> 21 years old) will complete five times sit to stand test in < 15 seconds indicating an increased LE strength and improved balance.    Time  2    Period  Weeks    Status  New    Target Date  06/08/19        PT Long Term Goals - 05/25/19 1457      PT LONG TERM GOAL #1   Title  Patient will increase six minute walk test distance to >1000 for progression to community ambulator and improve gait ability    Time  4    Period  Weeks    Status  New    Target Date  06/22/19      PT LONG TERM GOAL #2   Title  Patient will tolerate 5 seconds of single leg stance without loss of balance to improve ability to get in and out of shower safely.    Time  4    Period  Weeks    Status  New    Target Date  06/22/19      PT LONG TERM GOAL #3   Title  Patient will demonstrate an improved Berg Balance Score of > 48/56 as to demonstrate improved balance with ADLs such as sitting/standing and transfer balance and reduced fall risk.    Time  4    Period  Weeks    Status  New    Target Date  06/22/19            Plan - 06/01/19 1248    Clinical Impression Statement  Patient instructed in 6 min walk test and Berg Balance Assessment to further address goals. Patient exhibits better gait safety with less shortness of breath and less unsteadiness when walking. She did have difficulty with SLS and tandem stance. Patient instructed in advanced balance exercise as  part of HEP. Provided written handout for better adherence. Patient would benefit from additional skilled PT intervention to improve strength, balance and gait safety;    Personal Factors and Comorbidities  Age;Comorbidity 3+    Comorbidities  past encephalitis, memory deficits, high fall risk, DM type 2, obesity    Examination-Activity Limitations  Squat;Stairs;Stand;Locomotion Level;Caring for Others    Examination-Participation Restrictions  Cleaning;Community Activity;Driving;Laundry;Shop;Volunteer;Yard Work    Stability/Clinical Decision Making  Stable/Uncomplicated    Rehab Potential  Good    PT Frequency  1x / week    PT Duration  4 weeks    PT Treatment/Interventions  Cryotherapy;Moist Heat;Gait training;Stair training;Functional mobility training;Therapeutic activities;Therapeutic exercise;Balance training;Neuromuscular re-education;Patient/family education    PT Next Visit Plan  address HEP, do balance assessment such as FGA/Berg    PT Home Exercise Plan  will address next visit    Consulted and Agree with Plan of Care  Patient       Patient will benefit from skilled therapeutic intervention in order to improve the following deficits and impairments:  Decreased balance, Decreased endurance, Decreased mobility, Difficulty walking, Decreased activity tolerance, Decreased safety awareness  Visit Diagnosis:  1. Unsteadiness on feet   2. Difficulty in walking, not elsewhere classified   3. Muscle weakness (generalized)   4. Other lack of coordination        Problem List Patient Active Problem List   Diagnosis Date Noted  . Diabetes mellitus type 2 in nonobese (HCC)   . Transaminitis   . Chronic bilateral low back pain without sciatica   . TBI (traumatic brain injury) ([place]) 05/12/2019  . Aphasia 05/08/2019  . Diabetes mellitus type 2 in obese ([place]) 05/08/2019  . Hypothyroidism (acquired) 05/08/2019  . Altered mental status   . Pyrexia   . Seizure ([place])   . Encephalitis    . Seizures ([place]) 04/24/2016  . Acute respiratory failure ([place]) 04/24/2016    Pam Vanalstine PT, DPT 06/01/2019, 12:55 PM  [place] MAIN [facility] SERVICES [address] [place], [place], [address] Phone: [phone]   Fax:  [phone]  Name: Cathy Harrington MRN: [id number] Date of Birth: 1946/01/27

## 2019-06-01 NOTE — Patient Instructions (Signed)
SIT TO STAND: No Device   Sit with feet shoulder-width apart, on floor.(Make sure that you are in a chair that won't move like a chair against a wall or couch etc) Lean chest forward, raise hips up from surface. Straighten hips and knees. Weight bear equally on left and right sides. 10___ reps per set, _2__ sets per day, _5__ days per week Place left leg closer to sitting surface.  Copyright  VHI. All rights reserved.  Backward Walking   Walk backward, toes of each foot coming down first. Take long, even strides. Make sure you have a clear pathway with no obstructions when you do this. Stand beside counter and walk backward  And then walk forward doing opposite directions; repeat 10 laps 2x a day at least 5 days a week.  Copyright  VHI. All rights reserved.  Tandem Walking   Stand beside kitchen sink and place one foot in front of the other, lift your hand and try to hold position for 10 sec. Repeat with other foot in front; Repeat 5 reps with each foot in front 5 days a week.Balance: Unilateral   Attempt to balance on left leg, eyes open. Hold _5-10___ seconds.Start with holding onto counter and if you get your balance you can try to let go of counter. Repeat __5__ times per set. Do __1__ sets per session. Do __1__ sessions per day. Keep eyes open:   http://orth.exer.us/29   Copyright  VHI. All rights reserved.

## 2019-06-01 NOTE — Therapy (Signed)
[place] MAIN [facility] SERVICES [address] [place], [place], [address] Phone: [phone]   Fax:  [phone]  Occupational Therapy Treatment  Patient Details  Name: Cathy Harrington MRN: [id number] Date of Birth: [birth year] No data recorded  Encounter Date: 06/01/2019  OT End of Session - 06/01/19 1119    Visit Number  2    Number of Visits  12    Date for OT Re-Evaluation  07/05/19    OT Start Time  1104    OT Stop Time  1145    OT Time Calculation (min)  41 min    Activity Tolerance  Patient tolerated treatment well    Behavior During Therapy  [facility] for tasks assessed/performed       Past Medical History:  Diagnosis Date  . Diabetes mellitus without complication ([place])   . 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 Hypothyroidism   . Thyroid disease     Past Surgical History:  Procedure Laterality Date  . BACK SURGERY  2000  . BREAST SURGERY    . COLONOSCOPY    . COLONOSCOPY WITH PROPOFOL N/A 01/19/2018   Procedure: COLONOSCOPY WITH PROPOFOL;  Surgeon: Lollie Sails, MD;  Location: [facility] ENDOSCOPY;  Service: Endoscopy;  Laterality: N/A;  . parathyroidectomy      There were no vitals filed for this visit.  Subjective Assessment - 06/01/19 1118    Subjective   Pt. reports that she can not remember names.    Pertinent History  73 yo Female reports 2 weeks ago she was cooking and she reports that while she was cooking a pot of rice fell and she was trying to put it in the sink and became disoriented and was unable to call her husbands name or tell him what was wrong. Her husband brought her to the emergency room. She was transferred to [facility] and was doing well, being able to walk with supervision without AD. She does report having difficulty speaking with impaired memory. She denies any recent falls. She denies any  numbness/tingling; Patient does speak arabic as her primary language but does understand most [place]; She reports 3 years ago she was sick and reports since then she was having trouble with her memory and word finding.Patient has a history of type II diabetes, thyroid disease (TSH), viral encephalitis, HLD, Insomnia, overweight, DDD lumbar, memory deficits.    Currently in Pain?  No/denies      OT TREATMENT    Therapeutic Exercise:  Pt. performed 2# dowel ex. For UE strengthening secondary to weakness. Bilateral shoulder flexion, chest press, circular patterns, and elbow flexion/extension were performed.1 set 20 reps each. Pt. requires assist with counting. 2# dumbbell ex. for elbow flexion and extension, forearm supination/pronation, wrist flexion/extension, radial deviation. Pt. requires rest breaks and verbal cues for proper technique. Pt. requires assist with counting. Pt. performed gross gripping with grip strengthener. Pt. worked on sustaining grip while grasping pegs and reaching at various heights. Gripper was placed in the 3rd resistive slot with the white resistive spring.                               OT Long Term Goals - 05/31/19 1434      OT LONG TERM GOAL #1   Title  Patient will be independent with home exercise program for strengthening BUE.    Baseline  no current program    Time  6    Period  Weeks    Status  New    Target Date  07/05/19      OT LONG TERM GOAL #2   Title  Patient will improve grip strength by 5# to be able to complete cutting food with modified independence.    Baseline  requires assist with cutting some food, especially meat at eval    Time  6    Period  Weeks    Status  New    Target Date  07/05/19      OT LONG TERM GOAL #3   Title  Patient will complete shower transfer with modified independence.    Baseline  supervision at eval    Time  4    Period  Weeks    Status  New    Target Date  06/22/19      OT LONG TERM  GOAL #4   Title  Patient will complete light meal preparation with modified independence.    Baseline  requires assist at eval for meal prep    Time  6    Period  Weeks    Status  New    Target Date  07/05/19      OT LONG TERM GOAL #5   Title  Patient will complete light homemaking tasks with modified independence.    Baseline  Currently not performing since hospitalization.    Time  6    Period  Weeks    Status  New    Target Date  07/05/19            Plan - 06/01/19 1120    Clinical Impression Statement  Pt. reports that she has difficulty with her memory, and is unable to remember names. Pt. reports that she  forgets things frequently. Pt. continues to work on improving BUE strengthening, ADLs, and IADL functioning in order to improve, and maximize independence.    OT Occupational Profile and History  Detailed Assessment- Review of Records and additional review of physical, cognitive, psychosocial history related to current functional performance    Occupational performance deficits (Please refer to evaluation for details):  ADL's;IADL's;Social Participation    Body Structure / Function / Physical Skills  ADL;Coordination;UE functional use;Balance;Decreased knowledge of use of DME;IADL;Dexterity;FMC;Strength    Cognitive Skills  Sequencing;Memory    Psychosocial Skills  Environmental  Adaptations;Routines and Behaviors;Habits    Rehab Potential  Excellent    Clinical Decision Making  Limited treatment options, no task modification necessary    Comorbidities Affecting Occupational Performance:  May have comorbidities impacting occupational performance    Modification or Assistance to Complete Evaluation   No modification of tasks or assist necessary to complete eval    OT Frequency  2x / week    OT Duration  6 weeks    OT Treatment/Interventions  Self-care/ADL training;Therapeutic exercise;Moist Heat;Neuromuscular education;Patient/family education;Therapeutic activities;Balance  training;Cryotherapy;DME and/or AE instruction;Manual Therapy    Consulted and Agree with Plan of Care  Patient       Patient will benefit from skilled therapeutic intervention in order to improve the following deficits and impairments:   Body Structure / Function / Physical Skills: ADL, Coordination, UE functional use, Balance, Decreased knowledge of use of DME, IADL, Dexterity, FMC, Strength Cognitive Skills: Sequencing, Memory Psychosocial Skills: Environmental  Adaptations, Routines and Behaviors, Habits   Visit Diagnosis:  1. Muscle weakness (generalized)       Problem List Patient Active Problem List   Diagnosis Date Noted  . Diabetes mellitus type 2 in nonobese (HCC)   . Transaminitis   . Chronic bilateral low back pain without sciatica   . TBI (traumatic brain injury) ([place]) 05/12/2019  . Aphasia 05/08/2019  . Diabetes mellitus type 2 in obese ([place]) 05/08/2019  . Hypothyroidism (acquired) 05/08/2019  . Altered mental status   . Pyrexia   . Seizure ([place])   . Encephalitis   . Seizures ([place]) 04/24/2016  . Acute respiratory failure ([place]) 04/24/2016    Harrel Carina, MS, OTR/L 06/01/2019, 11:29 AM  [place] MAIN [facility] SERVICES [address] [place], [place], [address] Phone: [phone]   Fax:  [phone]  Name: Cathy Harrington MRN: [id number] Date of Birth: [birth year]

## 2019-06-02 ENCOUNTER — Telehealth: Payer: Self-pay | Admitting: Registered Nurse

## 2019-06-02 NOTE — Telephone Encounter (Signed)
[place] Level: 52: Results was reviewed with Dr. Letta Pate and discharge summary. Dr. Letta Pate would like for the results to be referred to Neurology. This provider placed a call to Dr. Melrose Nakayama( Neurology) office, spoke with Deborah Chalk, she will relay the message to Dr. Melrose Nakayama.  Ms. Pardi seen Dr. Melrose Nakayama today.

## 2019-06-03 ENCOUNTER — Encounter: Payer: Medicare Other | Admitting: Speech Pathology

## 2019-06-03 ENCOUNTER — Ambulatory Visit: Payer: Medicare Other

## 2019-06-03 ENCOUNTER — Encounter: Payer: Medicare Other | Admitting: Occupational Therapy

## 2019-06-07 ENCOUNTER — Ambulatory Visit: Payer: Medicare Other | Attending: Physician Assistant | Admitting: Occupational Therapy

## 2019-06-07 ENCOUNTER — Other Ambulatory Visit: Payer: Self-pay

## 2019-06-07 ENCOUNTER — Ambulatory Visit: Payer: Medicare Other | Admitting: Speech Pathology

## 2019-06-07 ENCOUNTER — Encounter: Payer: Self-pay | Admitting: Speech Pathology

## 2019-06-07 DIAGNOSIS — M6281 Muscle weakness (generalized): Secondary | ICD-10-CM

## 2019-06-07 DIAGNOSIS — R262 Difficulty in walking, not elsewhere classified: Secondary | ICD-10-CM | POA: Diagnosis present

## 2019-06-07 DIAGNOSIS — R4701 Aphasia: Secondary | ICD-10-CM | POA: Insufficient documentation

## 2019-06-07 DIAGNOSIS — R2681 Unsteadiness on feet: Secondary | ICD-10-CM | POA: Diagnosis present

## 2019-06-07 DIAGNOSIS — S069X0A Unspecified intracranial injury without loss of consciousness, initial encounter: Secondary | ICD-10-CM

## 2019-06-07 DIAGNOSIS — R278 Other lack of coordination: Secondary | ICD-10-CM | POA: Diagnosis present

## 2019-06-07 DIAGNOSIS — R41841 Cognitive communication deficit: Secondary | ICD-10-CM

## 2019-06-07 NOTE — Therapy (Signed)
[place] MAIN [facility] SERVICES [address] [place], [place], [address] Phone: [phone]   Fax:  [phone]  Speech Language Pathology Treatment  Patient Details  Name: Cathy Harrington MRN: [id number] Date of Birth: [birth year] Referring Provider (SLP): Lauraine Rinne   Encounter Date: 06/07/2019  End of Session - 06/07/19 1647    Visit Number  2    Number of Visits  17    Date for SLP Re-Evaluation  07/26/19    Authorization Type  Medicare    Authorization Time Period  Start 05/25/2019    Authorization - Visit Number  2    Authorization - Number of Visits  10    SLP Start Time  1350    SLP Stop Time   1445    SLP Time Calculation (min)  55 min    Activity Tolerance  Patient tolerated treatment well       Past Medical History:  Diagnosis Date  . Diabetes mellitus without complication ([place])   . 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 Hypothyroidism   . Thyroid disease     Past Surgical History:  Procedure Laterality Date  . BACK SURGERY  2000  . BREAST SURGERY    . COLONOSCOPY    . COLONOSCOPY WITH PROPOFOL N/A 01/19/2018   Procedure: COLONOSCOPY WITH PROPOFOL;  Surgeon: Lollie Sails, MD;  Location: [facility] ENDOSCOPY;  Service: Endoscopy;  Laterality: N/A;  . parathyroidectomy      There were no vitals filed for this visit.  Subjective Assessment - 06/07/19 1748    Subjective  The patient/family notice improvements in word finding, although still acknowledge that it is an area of need    Currently in Pain?  No/denies            ADULT SLP TREATMENT - 06/07/19 0001      General Information   Behavior/Cognition  Alert;Cooperative;Pleasant mood    HPI   Cathy Harrington is a 73 year old woman admitted to [facility] for TIA 05/08/2019.  MRI 05/10/2019: IMPRESSION: 1. No acute intracranial abnormality. 2. 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  3. Otherwise unremarkable brain MRI for age.  Patient received hospital and inpatient rehab SLP services 05/08/2019 - 05/18/2019. Documentation shows improvement over the course of treatment.  The patient received speech therapy at this facility 10/12/2018 - 11/23/2018 for cognitive communication deficits.       Treatment Provided   Treatment provided  Cognitive-Linquistic      Pain Assessment   Pain Assessment  No/denies pain      Cognitive-Linquistic Treatment   Treatment focused on  Aphasia    Skilled Treatment  READING AND WRITING: The patient scored an 80/100 on the reading subtest of the Western Aphasia Battery-Revised and a 92.5/100 on the writing subtest of the Western Aphasia Battery-Revised. WORD FINDING: In a confrontation naming exercise, the patient generates words for pictured items with 80% accuracy independently, increases to nearly 95% when provided with minimal-moderate semantic cues.      Assessment / Recommendations / Plan   Plan  Continue with current plan of care      Progression Toward Goals   Progression toward goals  Progressing toward goals       SLP Education - 06/07/19 1646    Education Details  Improvements in word finding for confrontation naming    Person(s) Educated  Patient    Methods  Explanation    Comprehension  Verbalized understanding         SLP Long Term Goals - 05/25/19 1636      SLP LONG TERM GOAL #1   Title  Patient will name common objects with 80% accuracy.    Time  8    Period  Weeks    Status  New    Target Date  07/26/19      SLP LONG TERM GOAL #2   Title  Patient will generate grammatical and cogent sentence to complete simple/concrete linguistic task with 80% accuracy.    Time  8    Period  Weeks    Status  New    Target Date  07/26/19      SLP LONG TERM GOAL #3   Title  Patient will complete moderate auditory  attention/vigilance/memory tasks with 80% accuracy.    Time  8    Period  Weeks    Status  New    Target Date  07/26/19      SLP LONG TERM GOAL #4   Title  Patient will write grammatical and cogent phrase to complete simple/concrete linguistic task with 80% accuracy.    Time  8    Period  Weeks    Status  New    Target Date  07/26/19      SLP LONG TERM GOAL #5   Title  Patient will demonstrate reading comprehension for sentences with 80% accuracy.    Time  8    Period  Weeks    Status  New    Target Date  07/26/19       Plan - 06/07/19 1648    Clinical Impression Statement  The patient continues to present with expressive aphasia characterized primarily by word finding deficits and paraphasias, and receptive aphasia as demonstrated by her difficulty with processing lengthy/complex language. Observed errors on the reading section of the WAB-R reveal reduced comprehension of lengthy/complex sentences. On the writing section of the WAB-R, the patient missed items on the writing to dictation section due to what appears to be an impairment in her sound-to-symbol correspondence (i.e. the patient hears the word "liquid," and writes "sickwin"). She demonstrates a high level of awareness for errors during writing tasks and works diligently to repair misspelled words. The patient shows significant improvements in word finding for object naming compared to her performance on the object naming section of the WAB-R. She is more responsive to semantic (sentence completion) cues and less responsive to phonemic cues. Plan to continue ST to target word retrieval and comprehension of lengthy/complex language.    Speech Therapy Frequency  2x / week    Duration  Other (comment)   8 weeks   Treatment/Interventions  Language facilitation;Compensatory techniques;Multimodal communcation approach;Patient/family education    Potential to Achieve Goals  Fair    Potential Considerations  Ability to learn/carryover  information;Previous level of function;Severity of impairments;Cooperation/participation level;Family/community support    Consulted and Agree with Plan of Care  Patient       Patient will benefit from skilled therapeutic intervention in order to improve the following deficits and impairments:   1. Aphasia       Problem List Patient Active Problem List   Diagnosis Date Noted  . Diabetes mellitus type 2 in nonobese (HCC)   . Transaminitis   . Chronic bilateral low back pain without  sciatica   . TBI (traumatic brain injury) ([place]) 05/12/2019  . Aphasia 05/08/2019  . Diabetes mellitus type 2 in obese ([place]) 05/08/2019  . Hypothyroidism (acquired) 05/08/2019  . Altered mental status   . Pyrexia   . Seizure ([place])   . Encephalitis   . Seizures ([place]) 04/24/2016  . Acute respiratory failure ([place]) 04/24/2016    Thressa Sheller, Graduate Student Clinician 06/07/2019, 5:51 PM  [place] MAIN [facility] SERVICES [address] [place], [place], [address] Phone: [phone]   Fax:  [phone]   Name: Cathy Harrington MRN: [id number] Date of Birth: 01/16/1946

## 2019-06-07 NOTE — Therapy (Signed)
[place] MAIN [facility] SERVICES [address] [place], [place], [address] Phone: [phone]   Fax:  [phone]  Occupational Therapy Treatment  Patient Details  Name: Cathy Harrington MRN: [id number] Date of Birth: January 01, 1946 No data recorded  Encounter Date: 06/07/2019  OT End of Session - 06/08/19 1951    Visit Number  3    Number of Visits  12    Date for OT Re-Evaluation  07/05/19    OT Start Time  1300    OT Stop Time  1346    OT Time Calculation (min)  46 min    Activity Tolerance  Patient tolerated treatment well    Behavior During Therapy  [facility] for tasks assessed/performed       Past Medical History:  Diagnosis Date  . Diabetes mellitus without complication ([place])   . 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 Hypothyroidism   . Thyroid disease     Past Surgical History:  Procedure Laterality Date  . BACK SURGERY  2000  . BREAST SURGERY    . COLONOSCOPY    . COLONOSCOPY WITH PROPOFOL N/A 01/19/2018   Procedure: COLONOSCOPY WITH PROPOFOL;  Surgeon: Lollie Sails, MD;  Location: [facility] ENDOSCOPY;  Service: Endoscopy;  Laterality: N/A;  . parathyroidectomy      There were no vitals filed for this visit.  Subjective Assessment - 06/08/19 1950    Subjective   Patient reports she is doing well, trying to talk as much as possible to help with her speech. She reports she is trying to more around the house.    Pertinent History  73 yo Female reports 2 weeks ago she was cooking and she reports that while she was cooking a pot of rice fell and she was trying to put it in the sink and became disoriented and was unable to call her husbands name or tell him what was wrong. Her husband brought her to the emergency room. She was transferred to [facility] and was doing well, being able to walk with supervision without AD. She does  report having difficulty speaking with impaired memory. She denies any recent falls. She denies any numbness/tingling; Patient does speak arabic as her primary language but does understand most [place]; She reports 3 years ago she was sick and reports since then she was having trouble with her memory and word finding.Patient has a history of type II diabetes, thyroid disease (TSH), viral encephalitis, HLD, Insomnia, overweight, DDD lumbar, memory deficits.    Patient Stated Goals  To be independent again and take care of herself and her family. She would like to be able to speak again normally.    Currently in Pain?  No/denies    Pain Score  0-No pain      Therapeutic Exercise: Patient seen for focus this date on sustained Grip strength patterns with right and left hands 4th setting 22# for 25 reps for 2 sets, cues for hand placement onto hand gripper as well as gripping patterns to pick up object with gripper and place into container.   Red theraband:  Shoulder flexion, ABD/ADD, diagonal patterns, elbow flexion, extension for 12 repetitions ofr 2 sets.  Cues for form and technique.    Finger strengthening with right and left hands with placing push pins into moderate resistance bulletin board, cues at times  for prehension patterns.    ADL:   Patient seen for kitchen tasks to empty dishwasher and put away dishes in the cabinet, no difficulty noted with this task today.    Response to tx:  Patient performing well in the kitchen this date to unload dishwasher and put items away.  Requires cues for UE exercises with grip strength and resistive exercises.  Patient continues to progress well with tasks in the clinic and appears to have carryover at home with increased participation in daily tasks.                   OT Education - 06/08/19 1951    Education Details  strength, kitchen tasks    Person(s) Educated  Patient    Methods  Explanation;Demonstration    Comprehension   Verbalized understanding;Returned demonstration          OT Long Term Goals - 05/31/19 1434      OT LONG TERM GOAL #1   Title  Patient will be independent with home exercise program for strengthening BUE.    Baseline  no current program    Time  6    Period  Weeks    Status  New    Target Date  07/05/19      OT LONG TERM GOAL #2   Title  Patient will improve grip strength by 5# to be able to complete cutting food with modified independence.    Baseline  requires assist with cutting some food, especially meat at eval    Time  6    Period  Weeks    Status  New    Target Date  07/05/19      OT LONG TERM GOAL #3   Title  Patient will complete shower transfer with modified independence.    Baseline  supervision at eval    Time  4    Period  Weeks    Status  New    Target Date  06/22/19      OT LONG TERM GOAL #4   Title  Patient will complete light meal preparation with modified independence.    Baseline  requires assist at eval for meal prep    Time  6    Period  Weeks    Status  New    Target Date  07/05/19      OT LONG TERM GOAL #5   Title  Patient will complete light homemaking tasks with modified independence.    Baseline  Currently not performing since hospitalization.    Time  6    Period  Weeks    Status  New    Target Date  07/05/19            Plan - 06/08/19 1952    Clinical Impression Statement  Patient performing well in the kitchen this date to unload dishwasher and put items away.  Requires cues for UE exercises with grip strength and resistive exercises.  Patient continues to progress well with tasks in the clinic and appears to have carryover at home with increased participation in daily tasks.    OT Occupational Profile and History  Detailed Assessment- Review of Records and additional review of physical, cognitive, psychosocial history related to current functional performance    Occupational performance deficits (Please refer to evaluation for  details):  ADL's;IADL's;Social Participation    Body Structure / Function / Physical Skills  ADL;Coordination;UE functional use;Balance;Decreased knowledge of use of DME;IADL;Dexterity;FMC;Strength    Cognitive  Skills  Sequencing;Memory    Psychosocial Skills  Environmental  Adaptations;Routines and Behaviors;Habits    Rehab Potential  Excellent    Clinical Decision Making  Limited treatment options, no task modification necessary    Comorbidities Affecting Occupational Performance:  May have comorbidities impacting occupational performance    Modification or Assistance to Complete Evaluation   No modification of tasks or assist necessary to complete eval    OT Frequency  2x / week    OT Duration  6 weeks    OT Treatment/Interventions  Self-care/ADL training;Therapeutic exercise;Moist Heat;Neuromuscular education;Patient/family education;Therapeutic activities;Balance training;Cryotherapy;DME and/or AE instruction;Manual Therapy    Consulted and Agree with Plan of Care  Patient       Patient will benefit from skilled therapeutic intervention in order to improve the following deficits and impairments:   Body Structure / Function / Physical Skills: ADL, Coordination, UE functional use, Balance, Decreased knowledge of use of DME, IADL, Dexterity, FMC, Strength Cognitive Skills: Sequencing, Memory Psychosocial Skills: Environmental  Adaptations, Routines and Behaviors, Habits   Visit Diagnosis: 1. Muscle weakness (generalized)   2. Other lack of coordination   3. Traumatic brain injury, without loss of consciousness, initial encounter ([place])   4. Cognitive communication deficit       Problem List Patient Active Problem List   Diagnosis Date Noted  . Diabetes mellitus type 2 in nonobese (HCC)   . Transaminitis   . Chronic bilateral low back pain without sciatica   . TBI (traumatic brain injury) ([place]) 05/12/2019  . Aphasia 05/08/2019  . Diabetes mellitus type 2 in obese ([place])  05/08/2019  . Hypothyroidism (acquired) 05/08/2019  . Altered mental status   . Pyrexia   . Seizure ([place])   . Encephalitis   . Seizures ([place]) 04/24/2016  . Acute respiratory failure ([place]) 04/24/2016   Mery Guadalupe T Tomasita Morrow, OTR/L, CLT  Yazir Koerber 06/08/2019, 8:00 PM  [place] MAIN [facility] SERVICES [address] [place], [place], [address] Phone: [phone]   Fax:  [phone]  Name: Cathy Harrington MRN: [id number] Date of Birth: 18-Oct-1946

## 2019-06-08 ENCOUNTER — Encounter: Payer: Self-pay | Admitting: Occupational Therapy

## 2019-06-10 ENCOUNTER — Ambulatory Visit: Payer: Medicare Other

## 2019-06-10 ENCOUNTER — Encounter: Payer: Self-pay | Admitting: Speech Pathology

## 2019-06-10 ENCOUNTER — Other Ambulatory Visit: Payer: Self-pay

## 2019-06-10 ENCOUNTER — Ambulatory Visit: Payer: Medicare Other | Admitting: Speech Pathology

## 2019-06-10 DIAGNOSIS — S069X0A Unspecified intracranial injury without loss of consciousness, initial encounter: Secondary | ICD-10-CM

## 2019-06-10 DIAGNOSIS — M6281 Muscle weakness (generalized): Secondary | ICD-10-CM | POA: Diagnosis not present

## 2019-06-10 DIAGNOSIS — R278 Other lack of coordination: Secondary | ICD-10-CM

## 2019-06-10 DIAGNOSIS — R4701 Aphasia: Secondary | ICD-10-CM

## 2019-06-10 NOTE — Therapy (Signed)
[place] MAIN [facility] SERVICES [address] [place], [place], [address] Phone: [phone]   Fax:  [phone]  Physical Therapy Treatment  Patient Details  Name: Cathy Harrington MRN: [id number] Date of Birth: [birth year] Referring Provider (PT): Lauraine Rinne PA   Encounter Date: 06/10/2019  PT End of Session - 06/10/19 1151    Visit Number  3    Number of Visits  5    Date for PT Re-Evaluation  06/22/19    PT Start Time  1000    PT Stop Time  1045    PT Time Calculation (min)  45 min    Equipment Utilized During Treatment  Gait belt    Activity Tolerance  Patient tolerated treatment well    Behavior During Therapy  [facility] for tasks assessed/performed       Past Medical History:  Diagnosis Date  . Diabetes mellitus without complication ([place])   . 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 Hypothyroidism   . Thyroid disease     Past Surgical History:  Procedure Laterality Date  . BACK SURGERY  2000  . BREAST SURGERY    . COLONOSCOPY    . COLONOSCOPY WITH PROPOFOL N/A 01/19/2018   Procedure: COLONOSCOPY WITH PROPOFOL;  Surgeon: Lollie Sails, MD;  Location: [facility] ENDOSCOPY;  Service: Endoscopy;  Laterality: N/A;  . parathyroidectomy      There were no vitals filed for this visit.  Subjective Assessment - 06/10/19 1149    Subjective  Pt reports no falls or problems since her last visit.  She does state that she feels she is "getting better" as far as word finding and walking.    Pertinent History  73 yo Female reports 2 weeks ago she was cooking and she reports that while she was cooking a pot of rice fell and she was trying to put it in the sink and became disoriented and was unable to call her husbands name or tell him what was wrong. Her husband brought her to the emergency room. She was transferred to [facility] and was  doing well, being able to walk with supervision without AD. She does report having difficulty speaking with impaired memory. She denies any recent falls. She denies any numbness/tingling; Patient does speak arabic as her primary language but does understand most [place]; She reports 3 years ago she was sick and reports since then she was having trouble with her memory and word finding.Patient has a history of type II diabetes, thyroid disease (TSH), viral encephalitis, HLD, Insomnia, overweight, DDD lumbar, memory deficits.    How long can you sit comfortably?  NA    How long can you stand comfortably?  approximately 20-30 min, does get fatigue does continue to have some back pain;    How long can you walk comfortably?  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    Patient Stated Goals  used to walk 30 min daily and would like to get back to walking;    Currently in Pain?  No/denies    Pain Score  0-No pain           Treatment:  Therapeutic Exercise:  Nustep L3 x 5 min; LAQs with 2# B 2x10; sit  to stands from green chair without UE support 2x10; stdg PF/DF with 2# B 20x; stdg B hip abd and hip extension with 2# B 2x10 each.  Neuromuscular Re-Education:  Tandem stance on blue foam half roller without UE support; tandem gait on taped line in // bars without UE support - 6 trials; narrow BOS on AirEx foam pad with EO/EC with UE's across torso; alt toe tapping on 4" step without UE support; lunges onto AirEx pad in // bars with and then without UE support; Karaoke on gym floor with 1 hand held A and heavy verbal and tactile cueing for form.                     PT Education - 06/10/19 1150    Education Details  Educated pt in tandem stance and tandem gait at [employer] for [employer]) Educated  Patient    Methods  Explanation;Demonstration;Verbal cues     Comprehension  Verbalized understanding;Returned demonstration;Verbal cues required       PT Short Term Goals - 05/25/19 1456      PT SHORT TERM GOAL #1   Title  Patient will be adherent to HEP at least 3x a week to improve functional strength and balance for better safety at home.    Time  3    Period  Weeks    Status  New    Target Date  06/08/19      PT SHORT TERM GOAL #2   Title  Patient (> 29 years old) will complete five times sit to stand test in < 15 seconds indicating an increased LE strength and improved balance.    Time  2    Period  Weeks    Status  New    Target Date  06/08/19        PT Long Term Goals - 05/25/19 1457      PT LONG TERM GOAL #1   Title  Patient will increase six minute walk test distance to >1000 for progression to community ambulator and improve gait ability    Time  4    Period  Weeks    Status  New    Target Date  06/22/19      PT LONG TERM GOAL #2   Title  Patient will tolerate 5 seconds of single leg stance without loss of balance to improve ability to get in and out of shower safely.    Time  4    Period  Weeks    Status  New    Target Date  06/22/19      PT LONG TERM GOAL #3   Title  Patient will demonstrate an improved Berg Balance Score of > 48/56 as to demonstrate improved balance with ADLs such as sitting/standing and transfer balance and reduced fall risk.    Time  4    Period  Weeks    Status  New    Target Date  06/22/19            Plan - 06/10/19 1151    Clinical Impression Statement  Pt needed verbal cueing for correct techinque with balance activities in gym today. She was able to improve her technique with verbal cueing. She reported tandem gait in // bars without UE support was the most challenging for her today.    Personal Factors and Comorbidities  Age;Comorbidity 3+    Comorbidities  past encephalitis, memory deficits, high fall risk, DM  type 2, obesity    Examination-Activity Limitations   Squat;Stairs;Stand;Locomotion Level;Caring for Others    Examination-Participation Restrictions  Cleaning;Community Activity;Driving;Laundry;Shop;Volunteer;Yard Work    Merchant navy officer  Stable/Uncomplicated    Designer, jewellery  Low    Rehab Potential  Good    PT Frequency  1x / week    PT Duration  4 weeks    PT Treatment/Interventions  Cryotherapy;Moist Heat;Gait training;Stair training;Functional mobility training;Therapeutic activities;Therapeutic exercise;Balance training;Neuromuscular re-education;Patient/family education    PT Next Visit Plan  address HEP, do balance assessment such as FGA/Berg    PT Home Exercise Plan  reviewed HEP with pt from last visit    Consulted and Agree with Plan of Care  Patient       Patient will benefit from skilled therapeutic intervention in order to improve the following deficits and impairments:  Decreased balance, Decreased endurance, Decreased mobility, Difficulty walking, Decreased activity tolerance, Decreased safety awareness  Visit Diagnosis: 1. Muscle weakness (generalized)   2. Other lack of coordination   3. Traumatic brain injury, without loss of consciousness, initial encounter [facility])        Problem List Patient Active Problem List   Diagnosis Date Noted  . Diabetes mellitus type 2 in nonobese (HCC)   . Transaminitis   . Chronic bilateral low back pain without sciatica   . TBI (traumatic brain injury) ([place]) 05/12/2019  . Aphasia 05/08/2019  . Diabetes mellitus type 2 in obese ([place]) 05/08/2019  . Hypothyroidism (acquired) 05/08/2019  . Altered mental status   . Pyrexia   . Seizure ([place])   . Encephalitis   . Seizures ([place]) 04/24/2016  . Acute respiratory failure ([place]) 04/24/2016    Selenne Coggin, MPT 06/10/2019, 11:55 AM  [place] MAIN [facility] SERVICES [address] [place], [place], [address] Phone: [phone]   Fax:  [phone]  Name: VAANI MORREN MRN: [id number] Date of Birth: 1946/09/26

## 2019-06-10 NOTE — Therapy (Signed)
[place] MAIN [facility] SERVICES [address] [place], [place], [address] Phone: [phone]   Fax:  [phone]  Speech Language Pathology Treatment  Patient Details  Name: Cathy Harrington MRN: [id number] Date of Birth: Mar 28, 1946 Referring Provider (SLP): Lauraine Rinne   Encounter Date: 06/10/2019  End of Session - 06/10/19 1047    Visit Number  3    Number of Visits  17    Date for SLP Re-Evaluation  07/26/19    Authorization Type  Medicare    Authorization Time Period  Start 05/25/2019    Authorization - Visit Number  3    Authorization - Number of Visits  10    SLP Start Time  8938    SLP Stop Time   0945    SLP Time Calculation (min)  50 min    Activity Tolerance  Patient tolerated treatment well       Past Medical History:  Diagnosis Date  . Diabetes mellitus without complication ([place])   . 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 Hypothyroidism   . Thyroid disease     Past Surgical History:  Procedure Laterality Date  . BACK SURGERY  2000  . BREAST SURGERY    . COLONOSCOPY    . COLONOSCOPY WITH PROPOFOL N/A 01/19/2018   Procedure: COLONOSCOPY WITH PROPOFOL;  Surgeon: Lollie Sails, MD;  Location: [facility] ENDOSCOPY;  Service: Endoscopy;  Laterality: N/A;  . parathyroidectomy      There were no vitals filed for this visit.  Subjective Assessment - 06/10/19 1044    Subjective  "I may not get to 100%, but I'm improving"    Currently in Pain?  No/denies            ADULT SLP TREATMENT - 06/10/19 0001      General Information   Behavior/Cognition  Alert;Cooperative;Pleasant mood    HPI   Cathy Harrington is a 73 year old woman admitted to [facility] for TIA 05/08/2019.  MRI 05/10/2019: IMPRESSION: 1. No acute intracranial abnormality. 2. 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  3. Otherwise unremarkable brain MRI for age.  Patient received hospital and inpatient rehab SLP services 05/08/2019 - 05/18/2019. Documentation shows improvement over the course of treatment.  The patient received speech therapy at this facility 10/12/2018 - 11/23/2018 for cognitive communication deficits.       Treatment Provided   Treatment provided  Cognitive-Linquistic      Pain Assessment   Pain Assessment  No/denies pain      Cognitive-Linquistic Treatment   Treatment focused on  Aphasia    Skilled Treatment  VERBAL EXPRESSION: The patient independently generates cogent/grammatical sentences to describe pictured scenes without word finding error with 70% accuracy. Given moderate semantic/phonemic cues, the patient's accuracy increases to 100%. WORD FINDING: The patient generates target words to match a short description with 50% accuracy independently, increases to 65% given moderate-maximal semantic/phonemic cues.      Assessment / Recommendations / Plan   Plan  Continue with current plan of care      Progression Toward Goals   Progression toward goals  Progressing toward goals       SLP Education - 06/10/19 1045    Education Details  Our goal is to maximize functional communication even if word-finding skills may not  reach 100%    Person(s) Educated  Patient    Methods  Explanation    Comprehension  Verbalized understanding         SLP Long Term Goals - 05/25/19 1636      SLP LONG TERM GOAL #1   Title  Patient will name common objects with 80% accuracy.    Time  8    Period  Weeks    Status  New    Target Date  07/26/19      SLP LONG TERM GOAL #2   Title  Patient will generate grammatical and cogent sentence to complete simple/concrete linguistic task with 80% accuracy.    Time  8    Period  Weeks    Status  New    Target Date  07/26/19      SLP LONG TERM GOAL #3   Title  Patient will complete moderate auditory  attention/vigilance/memory tasks with 80% accuracy.    Time  8    Period  Weeks    Status  New    Target Date  07/26/19      SLP LONG TERM GOAL #4   Title  Patient will write grammatical and cogent phrase to complete simple/concrete linguistic task with 80% accuracy.    Time  8    Period  Weeks    Status  New    Target Date  07/26/19      SLP LONG TERM GOAL #5   Title  Patient will demonstrate reading comprehension for sentences with 80% accuracy.    Time  8    Period  Weeks    Status  New    Target Date  07/26/19       Plan - 06/10/19 1047    Clinical Impression Statement  The patient continues to present with expressive aphasia characterized primarily by word finding deficits and paraphasias, and receptive aphasia as demonstrated by her difficulty with processing lengthy/complex language. She reports that she has been completing the provided word finding worksheets at home and having her husband check over her work. The patient is aware that she may not regain the verbal expression skills she once had, however, she is highly motivated to regain as much as possible. She is able to generate cogent sentences to describe pictured scenes, although requires some cuing to aid in word retrieval. She benefits from positive encouragement as she works through Associate Professor tasks. Plan to continue ST to target word retrieval and comprehension of lengthy/complex language.    Speech Therapy Frequency  2x / week    Duration  Other (comment)   8 weeks   Treatment/Interventions  Language facilitation;Compensatory techniques;Multimodal communcation approach;Patient/family education    Potential to Achieve Goals  Good    Potential Considerations  Ability to learn/carryover information;Previous level of function;Severity of impairments;Cooperation/participation level;Family/community support    Consulted and Agree with Plan of Care  Patient       Patient will benefit from skilled  therapeutic intervention in order to improve the following deficits and impairments:   1. Aphasia       Problem List Patient Active Problem List   Diagnosis Date Noted  . Diabetes mellitus type 2 in nonobese (HCC)   . Transaminitis   . Chronic bilateral low back pain without sciatica   . TBI (traumatic brain injury) ([place]) 05/12/2019  . Aphasia 05/08/2019  . Diabetes mellitus type 2 in obese ([place]) 05/08/2019  . Hypothyroidism (acquired) 05/08/2019  . Altered mental status   .  Pyrexia   . Seizure ([place])   . Encephalitis   . Seizures ([place]) 04/24/2016  . Acute respiratory failure ([place]) 04/24/2016    Thressa Sheller, Graduate Student Clinician 06/10/2019, 10:54 AM  [place] MAIN [facility] SERVICES [address] [place], [place], [address] Phone: [phone]   Fax:  [phone]   Name: Cathy Harrington MRN: [id number] Date of Birth: 1946-06-02

## 2019-06-12 ENCOUNTER — Other Ambulatory Visit: Payer: Self-pay | Admitting: Physical Medicine and Rehabilitation

## 2019-06-15 ENCOUNTER — Ambulatory Visit: Payer: Medicare Other | Admitting: Physical Therapy

## 2019-06-15 ENCOUNTER — Encounter: Payer: Self-pay | Admitting: Occupational Therapy

## 2019-06-15 ENCOUNTER — Encounter: Payer: Medicare Other | Admitting: Occupational Therapy

## 2019-06-15 ENCOUNTER — Ambulatory Visit: Payer: Medicare Other | Admitting: Occupational Therapy

## 2019-06-15 ENCOUNTER — Other Ambulatory Visit: Payer: Self-pay

## 2019-06-15 DIAGNOSIS — S069X0A Unspecified intracranial injury without loss of consciousness, initial encounter: Secondary | ICD-10-CM

## 2019-06-15 DIAGNOSIS — M6281 Muscle weakness (generalized): Secondary | ICD-10-CM

## 2019-06-15 DIAGNOSIS — R278 Other lack of coordination: Secondary | ICD-10-CM

## 2019-06-15 NOTE — Therapy (Signed)
[place] MAIN [facility] SERVICES [address] [place], [place], [address] Phone: [phone]   Fax:  [phone]  Occupational Therapy Treatment  Patient Details  Name: Cathy Harrington MRN: [id number] Date of Birth: April 14, 1946 No data recorded  Encounter Date: 06/15/2019  OT End of Session - 06/15/19 1033    Visit Number  4    Number of Visits  12    Date for OT Re-Evaluation  07/05/19    OT Start Time  8127    OT Stop Time  1100    OT Time Calculation (min)  45 min    Activity Tolerance  Patient tolerated treatment well    Behavior During Therapy  [facility] for tasks assessed/performed       Past Medical History:  Diagnosis Date  . Diabetes mellitus without complication ([place])   . 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 Hypothyroidism   . Thyroid disease     Past Surgical History:  Procedure Laterality Date  . BACK SURGERY  2000  . BREAST SURGERY    . COLONOSCOPY    . COLONOSCOPY WITH PROPOFOL N/A 01/19/2018   Procedure: COLONOSCOPY WITH PROPOFOL;  Surgeon: Lollie Sails, MD;  Location: [facility] ENDOSCOPY;  Service: Endoscopy;  Laterality: N/A;  . parathyroidectomy      There were no vitals filed for this visit.  Subjective Assessment - 06/15/19 1022    Subjective   Patient reports she is doing well and is trying to do some cooking now too.  She keeps trying to talk more and be active again but cannot walk by herself.    Pertinent History  73 yo Female reports 2 weeks ago she was cooking and she reports that while she was cooking a pot of rice fell and she was trying to put it in the sink and became disoriented and was unable to call her husbands name or tell him what was wrong. Her husband brought her to the emergency room. She was transferred to [facility] and was doing well, being able to walk with supervision without AD. She  does report having difficulty speaking with impaired memory. She denies any recent falls. She denies any numbness/tingling; Patient does speak arabic as her primary language but does understand most [place]; She reports 3 years ago she was sick and reports since then she was having trouble with her memory and word finding.Patient has a history of type II diabetes, thyroid disease (TSH), viral encephalitis, HLD, Insomnia, overweight, DDD lumbar, memory deficits.    Patient Stated Goals  To be independent again and take care of herself and her family. She would like to be able to speak again normally.    Currently in Pain?  No/denies      Self care skills:  Worked on Mining engineer training to make a list of vegetables and fruits with mod cues to identify correct words for cucumber, grapes, raisins, green beans and zucchini.  Strategies discussed during this exercise as well as recall of what she ate for breakfast, lunch and dinner with improved recall as she wrote down items she was able to recall and then needed mod cues with prompts.    Therapeutic exercises:  Red theraband exercises 3 sets of 10 standing to complete 75% of exercises with rest breaks as needed.  Cues for proper techniques and  to count out loud to keep from holding her breath.  Chest pull, lat pulls (with theraband under foot while seated), shoulder abduction and adduction and flexion and extension.  Patient continues to be motivated to regain strength, verbal skills, recall and problem solving skills to return to baseline before her seizures in July 2020.                     OT Education - 06/15/19 1103    Education Details  strengthening and writing skills    Person(s) Educated  Patient    Methods  Explanation;Demonstration    Comprehension  Verbalized understanding;Returned demonstration          OT Long Term Goals - 05/31/19 1434      OT LONG TERM GOAL #1   Title  Patient will be independent with home  exercise program for strengthening BUE.    Baseline  no current program    Time  6    Period  Weeks    Status  New    Target Date  07/05/19      OT LONG TERM GOAL #2   Title  Patient will improve grip strength by 5# to be able to complete cutting food with modified independence.    Baseline  requires assist with cutting some food, especially meat at eval    Time  6    Period  Weeks    Status  New    Target Date  07/05/19      OT LONG TERM GOAL #3   Title  Patient will complete shower transfer with modified independence.    Baseline  supervision at eval    Time  4    Period  Weeks    Status  New    Target Date  06/22/19      OT LONG TERM GOAL #4   Title  Patient will complete light meal preparation with modified independence.    Baseline  requires assist at eval for meal prep    Time  6    Period  Weeks    Status  New    Target Date  07/05/19      OT LONG TERM GOAL #5   Title  Patient will complete light homemaking tasks with modified independence.    Baseline  Currently not performing since hospitalization.    Time  6    Period  Weeks    Status  New    Target Date  07/05/19              Patient will benefit from skilled therapeutic intervention in order to improve the following deficits and impairments:           Visit Diagnosis: 1. Muscle weakness (generalized)   2. Other lack of coordination   3. Traumatic brain injury, without loss of consciousness, initial encounter [facility])       Problem List Patient Active Problem List   Diagnosis Date Noted  . Diabetes mellitus type 2 in nonobese (HCC)   . Transaminitis   . Chronic bilateral low back pain without sciatica   . TBI (traumatic brain injury) ([place]) 05/12/2019  . Aphasia 05/08/2019  . Diabetes mellitus type 2 in obese ([place]) 05/08/2019  . Hypothyroidism (acquired) 05/08/2019  . Altered mental status   . Pyrexia   . Seizure ([place])   . Encephalitis   . Seizures ([place]) 04/24/2016  . Acute  respiratory failure ([place]) 04/24/2016    Chrys Racer,  OTR/L, NTMTC ascom [phone] 06/15/19, 11:13 AM  [place] MAIN [facility] SERVICES [address] [place], [place], [address] Phone: [phone]   Fax:  [phone]  Name: MARILUZ CRESPO MRN: [id number] Date of Birth: Dec 14, 1945

## 2019-06-17 ENCOUNTER — Encounter: Payer: Self-pay | Admitting: Speech Pathology

## 2019-06-17 ENCOUNTER — Ambulatory Visit: Payer: Medicare Other

## 2019-06-17 ENCOUNTER — Ambulatory Visit: Payer: Medicare Other | Admitting: Speech Pathology

## 2019-06-17 ENCOUNTER — Other Ambulatory Visit: Payer: Self-pay

## 2019-06-17 DIAGNOSIS — R2681 Unsteadiness on feet: Secondary | ICD-10-CM

## 2019-06-17 DIAGNOSIS — R278 Other lack of coordination: Secondary | ICD-10-CM

## 2019-06-17 DIAGNOSIS — R262 Difficulty in walking, not elsewhere classified: Secondary | ICD-10-CM

## 2019-06-17 DIAGNOSIS — R4701 Aphasia: Secondary | ICD-10-CM

## 2019-06-17 DIAGNOSIS — M6281 Muscle weakness (generalized): Secondary | ICD-10-CM | POA: Diagnosis not present

## 2019-06-17 DIAGNOSIS — R41841 Cognitive communication deficit: Secondary | ICD-10-CM

## 2019-06-17 NOTE — Therapy (Signed)
[place] MAIN [facility] SERVICES [address] [place], [place], [address] Phone: [phone]   Fax:  [phone]  Speech Language Pathology Treatment  Patient Details  Name: Cathy Harrington MRN: [id number] Date of Birth: November 11, 1945 Referring Provider (SLP): Lauraine Rinne   Encounter Date: 06/17/2019  End of Session - 06/17/19 0911    Visit Number  4    Number of Visits  17    Date for SLP Re-Evaluation  07/26/19    Authorization Type  Medicare    Authorization Time Period  Start 05/25/2019    Authorization - Visit Number  4    Authorization - Number of Visits  10    SLP Start Time  0800    SLP Stop Time   0845    SLP Time Calculation (min)  45 min    Activity Tolerance  Patient tolerated treatment well       Past Medical History:  Diagnosis Date  . Diabetes mellitus without complication ([place])   . 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   . Hypothyroidism   . Thyroid disease     Past Surgical History:  Procedure Laterality Date  . BACK SURGERY  2000  . BREAST SURGERY    . COLONOSCOPY    . COLONOSCOPY WITH PROPOFOL N/A 01/19/2018   Procedure: COLONOSCOPY WITH PROPOFOL;  Surgeon: Lollie Sails, MD;  Location: [facility] ENDOSCOPY;  Service: Endoscopy;  Laterality: N/A;  . parathyroidectomy      There were no vitals filed for this visit.  Subjective Assessment - 06/17/19 0909    Subjective  "I'm late with my words"            ADULT SLP TREATMENT - 06/17/19 0001      General Information   Behavior/Cognition  Alert;Cooperative;Pleasant mood    HPI   Cathy Harrington is a 73 year old woman admitted to [facility] for TIA 05/08/2019.  MRI 05/10/2019: IMPRESSION: 1. No acute intracranial abnormality. 2. 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  3. Otherwise  unremarkable brain MRI for age.  Patient received hospital and inpatient rehab SLP services 05/08/2019 - 05/18/2019. Documentation shows improvement over the course of treatment.  The patient received speech therapy at this facility 10/12/2018 - 11/23/2018 for cognitive communication deficits.       Treatment Provided   Treatment provided  Cognitive-Linquistic      Pain Assessment   Pain Assessment  No/denies pain      Cognitive-Linquistic Treatment   Treatment focused on  Aphasia    Skilled Treatment  VERBAL EXPRESSION: The patient independently generates cogent/grammatical sentences to describe pictured scenes without word finding error with 70% accuracy. Given moderate semantic/phonemic cues, the patient's accuracy increases to 100%. WORD FINDING: The patient generates target words to match a short description with 50% accuracy independently, increases to 65% given moderate-maximal semantic/phonemic cues.      Assessment / Recommendations / Plan   Plan  Continue with current plan of care      Progression Toward Goals   Progression toward goals  Progressing toward goals       SLP Education - 06/17/19 0909    Education Details  Avoid using vague words to express yourself    Person(s) Educated  Patient    Methods  Explanation    Comprehension  Verbalized understanding         SLP Long Term Goals - 05/25/19 1636      SLP LONG TERM GOAL #1   Title  Patient will name common objects with 80% accuracy.    Time  8    Period  Weeks    Status  New    Target Date  07/26/19      SLP LONG TERM GOAL #2   Title  Patient will generate grammatical and cogent sentence to complete simple/concrete linguistic task with 80% accuracy.    Time  8    Period  Weeks    Status  New    Target Date  07/26/19      SLP LONG TERM GOAL #3   Title  Patient will complete moderate auditory attention/vigilance/memory tasks with 80% accuracy.    Time  8    Period  Weeks    Status  New    Target Date   07/26/19      SLP LONG TERM GOAL #4   Title  Patient will write grammatical and cogent phrase to complete simple/concrete linguistic task with 80% accuracy.    Time  8    Period  Weeks    Status  New    Target Date  07/26/19      SLP LONG TERM GOAL #5   Title  Patient will demonstrate reading comprehension for sentences with 80% accuracy.    Time  8    Period  Weeks    Status  New    Target Date  07/26/19       Plan - 06/17/19 0912    Clinical Impression Statement  The patient continues to present with expressive aphasia characterized primarily by word finding deficits and paraphasias, and receptive aphasia as demonstrated by her difficulty with processing lengthy/complex language. She reports that she has been completing the provided word finding worksheets at home and having her husband check over her work. The patient is aware that she may not regain the verbal expression skills she once had, however, she is highly motivated to regain as much as possible. She is able to generate cogent sentences to describe pictured scenes, although requires some cuing to aid in word retrieval. She benefits from positive encouragement as she works through Associate Professor tasks. Plan to continue ST to target word retrieval and comprehension of lengthy/complex language.    Speech Therapy Frequency  2x / week    Duration  Other (comment)    Treatment/Interventions  Language facilitation;Compensatory techniques;Multimodal communcation approach;Patient/family education    Potential Considerations  Ability to learn/carryover information;Previous level of function;Severity of impairments;Cooperation/participation level;Family/community support    SLP Home Exercise Plan  Provided    Consulted and Agree with Plan of Care  Patient       Patient will benefit from skilled therapeutic intervention in order to improve the following deficits and impairments:   1. Aphasia   2. Cognitive communication  deficit       Problem List Patient Active Problem List   Diagnosis Date Noted  . Diabetes mellitus type 2 in nonobese (HCC)   . Transaminitis   . Chronic bilateral low back pain without sciatica   . TBI (traumatic brain injury) ([place]) 05/12/2019  . Aphasia 05/08/2019  . Diabetes mellitus type 2 in obese ([place]) 05/08/2019  . Hypothyroidism (acquired) 05/08/2019  . Altered mental status   . Pyrexia   . Seizure ([place])   . Encephalitis   . Seizures ([place]) 04/24/2016  .  Acute respiratory failure ([employer]) 04/24/2016    [name], [name]  Lou Miner 06/17/2019, 9:13 AM  [place] MAIN [facility] SERVICES [address] [place], [place], [address] Phone: [phone]   Fax:  [phone]   Name: Cathy Harrington MRN: [id number] Date of Birth: 1946-09-14

## 2019-06-17 NOTE — Therapy (Signed)
Cathy Harrington [facility] SERVICES [address] [place], [place], [address] Phone: [phone]   Fax:  [phone]  Physical Therapy Treatment  Patient Details  Name: Cathy Harrington MRN: [id number] Date of Birth: [birth year] Referring Provider (PT): Lauraine Rinne PA   Encounter Date: 06/17/2019  PT End of Session - 06/17/19 0930    Visit Number  4    Number of Visits  5    Date for PT Re-Evaluation  06/22/19    PT Start Time  0845    PT Stop Time  0928    PT Time Calculation (min)  43 min    Equipment Utilized During Treatment  Gait belt    Activity Tolerance  Patient tolerated treatment well    Behavior During Therapy  [facility] for tasks assessed/performed       Past Medical History:  Diagnosis Date  . Diabetes mellitus without complication ([place])   . 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 Hypothyroidism   . Thyroid disease     Past Surgical History:  Procedure Laterality Date  . BACK SURGERY  2000  . BREAST SURGERY    . COLONOSCOPY    . COLONOSCOPY WITH PROPOFOL N/A 01/19/2018   Procedure: COLONOSCOPY WITH PROPOFOL;  Surgeon: Lollie Sails, MD;  Location: [facility] ENDOSCOPY;  Service: Endoscopy;  Laterality: N/A;  . parathyroidectomy      There were no vitals filed for this visit.  Subjective Assessment - 06/17/19 0847    Subjective  Patient reports she is going to her doctor this afternoon. No falls since she was here last. No pain or LOB.    Pertinent History  73 yo Female reports 2 weeks ago she was cooking and she reports that while she was cooking a pot of rice fell and she was trying to put it in the sink and became disoriented and was unable to call her husbands name or tell him what was wrong. Her husband brought her to the emergency room. She was transferred to [facility] and was doing well, being able to walk  with supervision without AD. She does report having difficulty speaking with impaired memory. She denies any recent falls. She denies any numbness/tingling; Patient does speak arabic as her primary language but does understand most [place]; She reports 3 years ago she was sick and reports since then she was having trouble with her memory and word finding.Patient has a history of type II diabetes, thyroid disease (TSH), viral encephalitis, HLD, Insomnia, overweight, DDD lumbar, memory deficits.    How long can you sit comfortably?  NA    How long can you stand comfortably?  approximately 20-30 min, does get fatigue does continue to have some back pain;    How long can you walk comfortably?  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    Patient Stated Goals  used to walk 30 min daily and would like to get back to walking;    Currently in Pain?  No/denies     Hallway: CGA with verbal cueing for sequencing and task orientation.   Ambulate with horizontal head turns 2x 86 ft,  More challenging looking to the L Ambulate with ball toss 2x 86 ft in air with  each step. No LOB Ambulate with naming of grocery list for dual task, increased internal rotation of L foot with dual tasking. 2x 86 ft   // Bars Modified single limb stance: one foot on soccer ball one foot on ground 2x 30 seconds each LE Half foam roller df/pf with BUE support 2 minutes; max cueing for sequencing and pelvic tilt for alignment   Seated: L foot eversion RTB 15x  2" step seated coordination toe taps for spatial awareness 30 seconds. Good coordination when focused, challenged with distracted Seated straight leg abduction 15x each LE Seated adduction ball squeeze between legs  with LAQ 10x  Matrix machine:  Resisted walking #7.5 lb, 3x each direction, lateral R, lateral L, forward/backwards. Challenge with eccentric  control cueing for abdominal activation and knee flexion for control.    Pt educated throughout session about proper posture and technique with exercises. Improved exercise technique, movement at target joints, use of target muscles after min to mod verbal, visual, tactile cues.   Patient presents with good motivation to physical therapy session. Has noticeable internal rotation of L foot with dual tasking and prolonged ambulation. Eccentric control continues to be challenging for patient but improves with repetition and cueing for correct muscle activation and body mechanics. Patient would benefit from additional skilled PT intervention to improve strength, balance and gait safety;                      PT Education - 06/17/19 0930    Education Details  need for HEP performance at home, dual tasking    Person(s) Educated  Patient    Methods  Explanation;Tactile cues;Demonstration;Verbal cues    Comprehension  Verbalized understanding;Verbal cues required;Tactile cues required;Returned demonstration;Need further instruction       PT Short Term Goals - 05/25/19 1456      PT SHORT TERM GOAL #1   Title  Patient will be adherent to HEP at least 3x a week to improve functional strength and balance for better safety at home.    Time  3    Period  Weeks    Status  New    Target Date  06/08/19      PT SHORT TERM GOAL #2   Title  Patient (> 63 years old) will complete five times sit to stand test in < 15 seconds indicating an increased LE strength and improved balance.    Time  2    Period  Weeks    Status  New    Target Date  06/08/19        PT Long Term Goals - 05/25/19 1457      PT LONG TERM GOAL #1   Title  Patient will increase six minute walk test distance to >1000 for progression to community ambulator and improve gait ability    Time  4    Period  Weeks    Status  New    Target Date  06/22/19      PT LONG TERM GOAL #2   Title  Patient will tolerate 5  seconds of single leg stance without loss of balance to improve ability to get in and out of shower safely.    Time  4    Period  Weeks    Status  New    Target Date  06/22/19      PT LONG TERM GOAL #3   Title  Patient will demonstrate an improved Berg Balance Score of > 48/56  as to demonstrate improved balance with ADLs such as sitting/standing and transfer balance and reduced fall risk.    Time  4    Period  Weeks    Status  New    Target Date  06/22/19            Plan - 06/17/19 0930    Clinical Impression Statement  Patient presents with good motivation to physical therapy session. Has noticeable internal rotation of L foot with dual tasking and prolonged ambulation. Eccentric control continues to be challenging for patient but improves with repetition and cueing for correct muscle activation and body mechanics. Patient would benefit from additional skilled PT intervention to improve strength, balance and gait safety;    Personal Factors and Comorbidities  Age;Comorbidity 3+    Comorbidities  past encephalitis, memory deficits, high fall risk, DM type 2, obesity    Examination-Activity Limitations  Squat;Stairs;Stand;Locomotion Level;Caring for Others    Examination-Participation Restrictions  Cleaning;Community Activity;Driving;Laundry;Shop;Volunteer;Yard Work    Stability/Clinical Decision Making  Stable/Uncomplicated    Rehab Potential  Good    PT Frequency  1x / week    PT Duration  4 weeks    PT Treatment/Interventions  Cryotherapy;Moist Heat;Gait training;Stair training;Functional mobility training;Therapeutic activities;Therapeutic exercise;Balance training;Neuromuscular re-education;Patient/family education    PT Next Visit Plan  address HEP, do balance assessment such as FGA/Berg    PT Home Exercise Plan  reviewed HEP with pt from last visit    Consulted and Agree with Plan of Care  Patient       Patient will benefit from skilled therapeutic intervention in order to  improve the following deficits and impairments:  Decreased balance, Decreased endurance, Decreased mobility, Difficulty walking, Decreased activity tolerance, Decreased safety awareness  Visit Diagnosis: 1. Muscle weakness (generalized)   2. Other lack of coordination   3. Unsteadiness on feet   4. Difficulty in walking, not elsewhere classified        Problem List Patient Active Problem List   Diagnosis Date Noted  . Diabetes mellitus type 2 in nonobese (HCC)   . Transaminitis   . Chronic bilateral low back pain without sciatica   . TBI (traumatic brain injury) ([place]) 05/12/2019  . Aphasia 05/08/2019  . Diabetes mellitus type 2 in obese ([place]) 05/08/2019  . Hypothyroidism (acquired) 05/08/2019  . Altered mental status   . Pyrexia   . Seizure ([place])   . Encephalitis   . Seizures ([place]) 04/24/2016  . Acute respiratory failure ([place]) 04/24/2016   Janna Arch, PT, DPT   06/17/2019, 9:31 AM  [place] [facility] SERVICES [address] [place], [place], [address] Phone: [phone]   Fax:  [phone]  Name: LUANN ASPINWALL MRN: [id number] Date of Birth: [birth year]

## 2019-06-21 ENCOUNTER — Ambulatory Visit: Payer: Medicare Other | Admitting: Occupational Therapy

## 2019-06-21 ENCOUNTER — Ambulatory Visit: Payer: Medicare Other | Admitting: Speech Pathology

## 2019-06-21 ENCOUNTER — Ambulatory Visit: Payer: Medicare Other | Admitting: Physical Therapy

## 2019-06-23 ENCOUNTER — Ambulatory Visit: Payer: Medicare Other | Admitting: Occupational Therapy

## 2019-06-23 ENCOUNTER — Ambulatory Visit: Payer: Medicare Other | Admitting: Speech Pathology

## 2019-06-23 ENCOUNTER — Ambulatory Visit: Payer: Medicare Other | Admitting: Physical Therapy

## 2019-06-23 ENCOUNTER — Other Ambulatory Visit: Payer: Self-pay

## 2019-06-23 ENCOUNTER — Encounter: Payer: Self-pay | Admitting: Speech Pathology

## 2019-06-23 ENCOUNTER — Encounter: Payer: Self-pay | Admitting: Occupational Therapy

## 2019-06-23 DIAGNOSIS — R278 Other lack of coordination: Secondary | ICD-10-CM

## 2019-06-23 DIAGNOSIS — M6281 Muscle weakness (generalized): Secondary | ICD-10-CM

## 2019-06-23 DIAGNOSIS — R4701 Aphasia: Secondary | ICD-10-CM

## 2019-06-23 DIAGNOSIS — R41841 Cognitive communication deficit: Secondary | ICD-10-CM

## 2019-06-23 NOTE — Therapy (Signed)
[place] MAIN [facility] SERVICES [address] [place], [place], [address] Phone: [phone]   Fax:  [phone]  Occupational Therapy Treatment  Patient Details  Name: Cathy Harrington MRN: [id number] Date of Birth: [birth year] No data recorded  Encounter Date: 06/23/2019  OT End of Session - 06/23/19 0921    Visit Number  5    Number of Visits  12    Date for OT Re-Evaluation  07/05/19    OT Start Time  0915    OT Stop Time  1000    OT Time Calculation (min)  45 min    Activity Tolerance  Patient tolerated treatment well    Behavior During Therapy  [facility] for tasks assessed/performed       Past Medical History:  Diagnosis Date  . Diabetes mellitus without complication ([place])   . 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 Hypothyroidism   . Thyroid disease     Past Surgical History:  Procedure Laterality Date  . BACK SURGERY  2000  . BREAST SURGERY    . COLONOSCOPY    . COLONOSCOPY WITH PROPOFOL N/A 01/19/2018   Procedure: COLONOSCOPY WITH PROPOFOL;  Surgeon: Lollie Sails, MD;  Location: [facility] ENDOSCOPY;  Service: Endoscopy;  Laterality: N/A;  . parathyroidectomy      There were no vitals filed for this visit.  Subjective Assessment - 06/23/19 0920    Subjective   Pt. reports that she had stomach issues this past week.    Pertinent History  73 yo Female reports 2 weeks ago she was cooking and she reports that while she was cooking a pot of rice fell and she was trying to put it in the sink and became disoriented and was unable to call her husbands name or tell him what was wrong. Her husband brought her to the emergency room. She was transferred to [facility] and was doing well, being able to walk with supervision without AD. She does report having difficulty speaking with impaired memory. She denies any recent falls. She  denies any numbness/tingling; Patient does speak arabic as her primary language but does understand most [place]; She reports 3 years ago she was sick and reports since then she was having trouble with her memory and word finding.Patient has a history of type II diabetes, thyroid disease (TSH), viral encephalitis, HLD, Insomnia, overweight, DDD lumbar, memory deficits.    Patient Stated Goals  To be independent again and take care of herself and her family. She would like to be able to speak again normally.    Currently in Pain?  No/denies      OT TREATMENT    Neuromuscular re-ed:  Therapeutic Exercise:  Pt. performed bilateral UE strengthening with red theraband for bilateral shoulder flexion, horizontal abduction, and diagonal shoulder flexion, elbow flexion, and extension. 1 set 20 reps each 2# dumbbell ex. for elbow flexion and extension,  2# for forearm supination/pronation, wrist flexion/extension, and radial deviation. Pt. requires rest breaks and verbal cues for proper technique. 1 set 20 reps each with cues, and visual demonstration.Pt. Worked on grasping one inch resistive cubes alternating thumb opposition to the tip of the 2nd through 5th digits. The board was positioned at a vertical angle. Pt. Worked on pressing them back into place while isolating 2nd through 5th digits. Pt. Worked on bilateral gross  gripping with green resistive band. Pt. Worked on digit flexion using the digiflex 1.5#.                        OT Education - 06/23/19 0921    Education Details  Stregnthening, [facility]    Person(s) Educated  Patient    Methods  Explanation;Demonstration    Comprehension  Verbalized understanding;Returned demonstration          OT Long Term Goals - 05/31/19 1434      OT LONG TERM GOAL #1   Title  Patient will be independent with home exercise program for strengthening BUE.    Baseline  no current program    Time  6    Period  Weeks    Status  New    Target  Date  07/05/19      OT LONG TERM GOAL #2   Title  Patient will improve grip strength by 5# to be able to complete cutting food with modified independence.    Baseline  requires assist with cutting some food, especially meat at eval    Time  6    Period  Weeks    Status  New    Target Date  07/05/19      OT LONG TERM GOAL #3   Title  Patient will complete shower transfer with modified independence.    Baseline  supervision at eval    Time  4    Period  Weeks    Status  New    Target Date  06/22/19      OT LONG TERM GOAL #4   Title  Patient will complete light meal preparation with modified independence.    Baseline  requires assist at eval for meal prep    Time  6    Period  Weeks    Status  New    Target Date  07/05/19      OT LONG TERM GOAL #5   Title  Patient will complete light homemaking tasks with modified independence.    Baseline  Currently not performing since hospitalization.    Time  6    Period  Weeks    Status  New    Target Date  07/05/19            Plan - 06/23/19 1610    Clinical Impression Statement  Pt. reports that she had stomch/intestinal issues this past week, and is feeling better now. Pt. is making steady progress overall. Pt. continues to present with limited BUE strength, [facility] skills, and cognitive functions order to improve ADL, and IADL functioning.    OT Occupational Profile and History  Detailed Assessment- Review of Records and additional review of physical, cognitive, psychosocial history related to current functional performance    Occupational performance deficits (Please refer to evaluation for details):  ADL's;IADL's;Social Participation    Body Structure / Function / Physical Skills  ADL;Coordination;UE functional use;Balance;Decreased knowledge of use of DME;IADL;Dexterity;FMC;Strength    Cognitive Skills  Sequencing;Memory    Rehab Potential  Excellent    Clinical Decision Making  Limited treatment options, no task modification  necessary    Comorbidities Affecting Occupational Performance:  May have comorbidities impacting occupational performance    Modification or Assistance to Complete Evaluation   No modification of tasks or assist necessary to complete eval    OT Frequency  2x / week    OT Duration  12 weeks    OT Treatment/Interventions  Self-care/ADL training;Therapeutic exercise;Moist Heat;Neuromuscular education;Patient/family education;Therapeutic activities;Balance training;Cryotherapy;DME and/or AE instruction;Manual Therapy    Consulted and Agree with Plan of Care  Patient       Patient will benefit from skilled therapeutic intervention in order to improve the following deficits and impairments:   Body Structure / Function / Physical Skills: ADL, Coordination, UE functional use, Balance, Decreased knowledge of use of DME, IADL, Dexterity, FMC, Strength Cognitive Skills: Sequencing, Memory     Visit Diagnosis: 1. Muscle weakness (generalized)   2. Other lack of coordination       Problem List Patient Active Problem List   Diagnosis Date Noted  . Diabetes mellitus type 2 in nonobese (HCC)   . Transaminitis   . Chronic bilateral low back pain without sciatica   . TBI (traumatic brain injury) ([place]) 05/12/2019  . Aphasia 05/08/2019  . Diabetes mellitus type 2 in obese ([place]) 05/08/2019  . Hypothyroidism (acquired) 05/08/2019  . Altered mental status   . Pyrexia   . Seizure ([place])   . Encephalitis   . Seizures ([place]) 04/24/2016  . Acute respiratory failure ([place]) 04/24/2016    Harrel Carina, MS, OTR/L 06/23/2019, 9:34 AM  [place] MAIN [facility] SERVICES [address] [place], [place], [address] Phone: [phone]   Fax:  [phone]  Name: Cathy Harrington MRN: [id number] Date of Birth: 14-Jul-1946

## 2019-06-23 NOTE — Therapy (Signed)
[place] MAIN [facility] SERVICES [address] [place], [place], [address] Phone: [phone]   Fax:  [phone]  Speech Language Pathology Treatment  Patient Details  Name: Cathy Harrington MRN: [id number] Date of Birth: 17-Jan-1946 Referring Provider (SLP): Lauraine Rinne   Encounter Date: 06/23/2019  End of Session - 06/23/19 1110    Visit Number  5    Number of Visits  17    Date for SLP Re-Evaluation  07/26/19    Authorization Type  Medicare    Authorization Time Period  Start 05/25/2019    Authorization - Visit Number  5    Authorization - Number of Visits  10    SLP Start Time  1000    SLP Stop Time   1050    SLP Time Calculation (min)  50 min    Activity Tolerance  Patient tolerated treatment well       Past Medical History:  Diagnosis Date  . Diabetes mellitus without complication ([place])   . 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 Hypothyroidism   . Thyroid disease     Past Surgical History:  Procedure Laterality Date  . BACK SURGERY  2000  . BREAST SURGERY    . COLONOSCOPY    . COLONOSCOPY WITH PROPOFOL N/A 01/19/2018   Procedure: COLONOSCOPY WITH PROPOFOL;  Surgeon: Lollie Sails, MD;  Location: [facility] ENDOSCOPY;  Service: Endoscopy;  Laterality: N/A;  . parathyroidectomy      There were no vitals filed for this visit.  Subjective Assessment - 06/23/19 1109    Subjective  "I'm late with my words"            ADULT SLP TREATMENT - 06/23/19 0001      General Information   Behavior/Cognition  Alert;Cooperative;Pleasant mood    HPI   Cathy Harrington is a 73 year old woman admitted to [facility] for TIA 05/08/2019.  MRI 05/10/2019: IMPRESSION: 1. No acute intracranial abnormality. 2. 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  3. Otherwise  unremarkable brain MRI for age.  Patient received hospital and inpatient rehab SLP services 05/08/2019 - 05/18/2019. Documentation shows improvement over the course of treatment.  The patient received speech therapy at this facility 10/12/2018 - 11/23/2018 for cognitive communication deficits.       Treatment Provided   Treatment provided  Cognitive-Linquistic      Pain Assessment   Pain Assessment  No/denies pain      Cognitive-Linquistic Treatment   Treatment focused on  Aphasia    Skilled Treatment  VERBAL EXPRESSION: The patient independently generates cogent/grammatical sentences to describe pictured scenes without word finding error with 70% accuracy. Given moderate semantic/phonemic cues, the patient's accuracy increases to 100%. The patient does have difficulty discerning details.  WORD FINDING: The patient generates target words to match a short description with 50% accuracy independently, increases to 70% given moderate-maximal semantic/phonemic cues.      Assessment / Recommendations / Plan   Plan  Continue with current plan of care      Progression Toward Goals   Progression toward goals  Progressing toward goals       SLP Education - 06/23/19 1110    Education Details  Avoid vague words    Person(s) Educated  Patient    Methods  Explanation  Comprehension  Verbalized understanding         SLP Long Term Goals - 05/25/19 1636      SLP LONG TERM GOAL #1   Title  Patient will name common objects with 80% accuracy.    Time  8    Period  Weeks    Status  New    Target Date  07/26/19      SLP LONG TERM GOAL #2   Title  Patient will generate grammatical and cogent sentence to complete simple/concrete linguistic task with 80% accuracy.    Time  8    Period  Weeks    Status  New    Target Date  07/26/19      SLP LONG TERM GOAL #3   Title  Patient will complete moderate auditory attention/vigilance/memory tasks with 80% accuracy.    Time  8    Period  Weeks    Status   New    Target Date  07/26/19      SLP LONG TERM GOAL #4   Title  Patient will write grammatical and cogent phrase to complete simple/concrete linguistic task with 80% accuracy.    Time  8    Period  Weeks    Status  New    Target Date  07/26/19      SLP LONG TERM GOAL #5   Title  Patient will demonstrate reading comprehension for sentences with 80% accuracy.    Time  8    Period  Weeks    Status  New    Target Date  07/26/19       Plan - 06/23/19 1111    Clinical Impression Statement  The patient continues to present with expressive aphasia characterized primarily by word finding deficits and paraphasias, and receptive aphasia as demonstrated by her difficulty with processing lengthy/complex language. She reports that she has been completing the provided word finding worksheets at home and having her husband check over her work. The patient is aware that she may not regain the verbal expression skills she once had, however, she is highly motivated to regain as much as possible. She is able to generate cogent sentences to describe pictured scenes, although requires some cuing to aid in word retrieval. She benefits from positive encouragement as she works through Associate Professor tasks. Plan to continue ST to target word retrieval and comprehension of lengthy/complex language.    Speech Therapy Frequency  2x / week    Duration  Other (comment)    Treatment/Interventions  Language facilitation;Compensatory techniques;Multimodal communcation approach;Patient/family education    Potential to Achieve Goals  Good    Potential Considerations  Ability to learn/carryover information;Previous level of function;Severity of impairments;Cooperation/participation level;Family/community support    SLP Home Exercise Plan  Provided    Consulted and Agree with Plan of Care  Patient       Patient will benefit from skilled therapeutic intervention in order to improve the following deficits and  impairments:   Aphasia  Cognitive communication deficit    Problem List Patient Active Problem List   Diagnosis Date Noted  . Diabetes mellitus type 2 in nonobese (HCC)   . Transaminitis   . Chronic bilateral low back pain without sciatica   . TBI (traumatic brain injury) ([place]) 05/12/2019  . Aphasia 05/08/2019  . Diabetes mellitus type 2 in obese ([place]) 05/08/2019  . Hypothyroidism (acquired) 05/08/2019  . Altered mental status   . Pyrexia   . Seizure ([place])   . Encephalitis   .  Seizures ([place]) 04/24/2016  . Acute respiratory failure ([place]) 04/24/2016   [name], [name]  Lou Miner 06/23/2019, 11:13 AM  [place] MAIN [facility] SERVICES [address] [place], [place], [address] Phone: [phone]   Fax:  [phone]   Name: Cathy Harrington MRN: [id number] Date of Birth: 1946-05-31

## 2019-06-28 ENCOUNTER — Ambulatory Visit: Payer: Medicare Other | Admitting: Occupational Therapy

## 2019-06-28 ENCOUNTER — Ambulatory Visit: Payer: Medicare Other | Admitting: Physical Therapy

## 2019-06-28 ENCOUNTER — Ambulatory Visit: Payer: Medicare Other | Admitting: Speech Pathology

## 2019-06-28 ENCOUNTER — Other Ambulatory Visit: Payer: Self-pay

## 2019-06-28 ENCOUNTER — Encounter: Payer: Self-pay | Admitting: Occupational Therapy

## 2019-06-28 DIAGNOSIS — R278 Other lack of coordination: Secondary | ICD-10-CM

## 2019-06-28 DIAGNOSIS — R4701 Aphasia: Secondary | ICD-10-CM

## 2019-06-28 DIAGNOSIS — M6281 Muscle weakness (generalized): Secondary | ICD-10-CM

## 2019-06-28 DIAGNOSIS — R41841 Cognitive communication deficit: Secondary | ICD-10-CM

## 2019-06-29 ENCOUNTER — Encounter: Payer: Self-pay | Admitting: Speech Pathology

## 2019-06-29 NOTE — Therapy (Signed)
[place] MAIN [facility] SERVICES [address] [place], [place], [address] Phone: [phone]   Fax:  [phone]  Speech Language Pathology Treatment  Patient Details  Name: Cathy Harrington MRN: [id number] Date of Birth: 05-24-1946 Referring Provider (SLP): Lauraine Rinne   Encounter Date: 06/28/2019  End of Session - 06/29/19 1147    Visit Number  6    Number of Visits  17    Date for SLP Re-Evaluation  07/26/19    Authorization Type  Medicare    Authorization Time Period  Start 05/25/2019    Authorization - Visit Number  6    Authorization - Number of Visits  10    SLP Start Time  1000    SLP Stop Time   1050    SLP Time Calculation (min)  50 min    Activity Tolerance  Patient tolerated treatment well       Past Medical History:  Diagnosis Date  . Diabetes mellitus without complication ([place])   . 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 Hypothyroidism   . Thyroid disease     Past Surgical History:  Procedure Laterality Date  . BACK SURGERY  2000  . BREAST SURGERY    . COLONOSCOPY    . COLONOSCOPY WITH PROPOFOL N/A 01/19/2018   Procedure: COLONOSCOPY WITH PROPOFOL;  Surgeon: Lollie Sails, MD;  Location: [facility] ENDOSCOPY;  Service: Endoscopy;  Laterality: N/A;  . parathyroidectomy      There were no vitals filed for this visit.  Subjective Assessment - 06/29/19 1146    Subjective  The patient was alert, cooperative, and pleasant throughout the therapy session. Given minimal cueing/clarification questions, she was able to report on the events of her weekend and to discuss strategies for increasing her level of physical/mental activity and communication in her everyday life.            ADULT SLP TREATMENT - 06/29/19 0001      General Information   Behavior/Cognition  Alert;Cooperative;Pleasant mood    HPI   Cathy Harrington is a  73 year old woman admitted to [facility] for TIA 05/08/2019.  MRI 05/10/2019: IMPRESSION: 1. No acute intracranial abnormality. 2. 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  3. Otherwise unremarkable brain MRI for age.  Patient received hospital and inpatient rehab SLP services 05/08/2019 - 05/18/2019. Documentation shows improvement over the course of treatment.  The patient received speech therapy at this facility 10/12/2018 - 11/23/2018 for cognitive communication deficits.       Treatment Provided   Treatment provided  Cognitive-Linquistic      Pain Assessment   Pain Assessment  No/denies pain      Cognitive-Linquistic Treatment   Treatment focused on  Aphasia    Skilled Treatment  The patient provided target responses to match simple descriptions with 44% accuracy without interventions. Given cloze procedures and moderate-maximal semantic/phonemic cues, accuracy increased to 76%. She demonstrated reading comprehension at the paragraph level with 80% accuracy independently. Given minimal cueing, accuracy increased to 100%. The graduate clinician provided patient education regarding the rehabilitative benefits of establishing daily routines, as the patient reported "not talking with anyone" on a regular basis and lower energy/motivation secondary to her medication.       Assessment / Recommendations / Plan   Plan  Continue  with current plan of care      Progression Toward Goals   Progression toward goals  Progressing toward goals       SLP Education - 06/29/19 1146    Education Details  Avoid vague words    Person(s) Educated  Patient    Methods  Explanation    Comprehension  Verbalized understanding         SLP Long Term Goals - 05/25/19 1636      SLP LONG TERM GOAL #1   Title  Patient will name common objects with 80% accuracy.    Time  8    Period  Weeks    Status  New    Target Date  07/26/19       SLP LONG TERM GOAL #2   Title  Patient will generate grammatical and cogent sentence to complete simple/concrete linguistic task with 80% accuracy.    Time  8    Period  Weeks    Status  New    Target Date  07/26/19      SLP LONG TERM GOAL #3   Title  Patient will complete moderate auditory attention/vigilance/memory tasks with 80% accuracy.    Time  8    Period  Weeks    Status  New    Target Date  07/26/19      SLP LONG TERM GOAL #4   Title  Patient will write grammatical and cogent phrase to complete simple/concrete linguistic task with 80% accuracy.    Time  8    Period  Weeks    Status  New    Target Date  07/26/19      SLP LONG TERM GOAL #5   Title  Patient will demonstrate reading comprehension for sentences with 80% accuracy.    Time  8    Period  Weeks    Status  New    Target Date  07/26/19       Plan - 06/29/19 1147    Clinical Impression Statement  The patient expresses motivation to improve her cognitive-linguistic abilities, as well as awareness that she may not recover 100% of her prior language skills. Her expressive language difficulties are primarily characterized by word finding difficulties and semantic paraphasias. Receptively, she demonstrates comprehension of simple language but exhibits processing difficulties with more complex language. She is critical of her own word retrieval challenges, frequently admitting that she feels embarrassed about not being able to find the right words, and is noted to benefit from positive encouragement.    Speech Therapy Frequency  2x / week    Duration  Other (comment)    Treatment/Interventions  Language facilitation;Compensatory techniques;Multimodal communcation approach;Patient/family education    Potential to Achieve Goals  Good    Potential Considerations  Ability to learn/carryover information;Previous level of function;Severity of impairments;Cooperation/participation level;Family/community support    Consulted and  Agree with Plan of Care  Patient       Patient will benefit from skilled therapeutic intervention in order to improve the following deficits and impairments:   Aphasia  Cognitive communication deficit    Problem List Patient Active Problem List   Diagnosis Date Noted  . Diabetes mellitus type 2 in nonobese (HCC)   . Transaminitis   . Chronic bilateral low back pain without sciatica   . TBI (traumatic brain injury) ([place]) 05/12/2019  . Aphasia 05/08/2019  . Diabetes mellitus type 2 in obese ([place]) 05/08/2019  . Hypothyroidism (acquired) 05/08/2019  . Altered mental status   .  Pyrexia   . Seizure ([place])   . Encephalitis   . Seizures ([place]) 04/24/2016  . Acute respiratory failure ([place]) 04/24/2016   [name], [name]  Lou Miner 06/29/2019, 11:49 AM  [place] MAIN [facility] SERVICES [address] [place], [place], [address] Phone: [phone]   Fax:  [phone]   Name: Cathy Harrington MRN: [id number] Date of Birth: 22-Nov-1945

## 2019-07-01 ENCOUNTER — Other Ambulatory Visit: Payer: Self-pay

## 2019-07-01 ENCOUNTER — Encounter: Payer: Self-pay | Admitting: Occupational Therapy

## 2019-07-01 ENCOUNTER — Ambulatory Visit: Payer: Medicare Other | Admitting: Speech Pathology

## 2019-07-01 ENCOUNTER — Ambulatory Visit: Payer: Medicare Other

## 2019-07-01 ENCOUNTER — Ambulatory Visit: Payer: Medicare Other | Admitting: Occupational Therapy

## 2019-07-01 ENCOUNTER — Encounter: Payer: Self-pay | Admitting: Speech Pathology

## 2019-07-01 DIAGNOSIS — R41841 Cognitive communication deficit: Secondary | ICD-10-CM

## 2019-07-01 DIAGNOSIS — R4701 Aphasia: Secondary | ICD-10-CM

## 2019-07-01 DIAGNOSIS — M6281 Muscle weakness (generalized): Secondary | ICD-10-CM

## 2019-07-01 DIAGNOSIS — R278 Other lack of coordination: Secondary | ICD-10-CM

## 2019-07-01 NOTE — Therapy (Signed)
[place] MAIN [facility] SERVICES [address] [place], [place], [address] Phone: [phone]   Fax:  [phone]  Speech Language Pathology Treatment/Discharge Summary  Patient Details  Name: Cathy Harrington MRN: [id number] Date of Birth: 1946-01-31 Referring Provider (SLP): Cathy Harrington   Encounter Date: 07/01/2019  End of Session - 07/01/19 1302    Visit Number  7    Number of Visits  17    Date for SLP Re-Evaluation  07/26/19    SLP Start Time  0900    SLP Stop Time   0945    SLP Time Calculation (min)  45 min    Activity Tolerance  Patient tolerated treatment well       Past Medical History:  Diagnosis Date  . Diabetes mellitus without complication ([place])   . 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 Hypothyroidism   . Thyroid disease     Past Surgical History:  Procedure Laterality Date  . BACK SURGERY  2000  . BREAST SURGERY    . COLONOSCOPY    . COLONOSCOPY WITH PROPOFOL N/A 01/19/2018   Procedure: COLONOSCOPY WITH PROPOFOL;  Surgeon: Lollie Sails, MD;  Location: [facility] ENDOSCOPY;  Service: Endoscopy;  Laterality: N/A;  . parathyroidectomy      There were no vitals filed for this visit.  Subjective Assessment - 07/01/19 1301    Subjective  The patient was alert, cooperative, and pleasant throughout the therapy session. Given minimal cueing/clarification questions, she was able to report on the events of her weekend and to discuss strategies for increasing her level of physical/mental activity and communication in her everyday life.            ADULT SLP TREATMENT - 07/01/19 0001      General Information   Behavior/Cognition  Alert;Cooperative;Pleasant mood    HPI   Cathy Harrington is a 73 year old woman admitted to [facility] for TIA 05/08/2019.  MRI 05/10/2019: IMPRESSION: 1. No acute intracranial abnormality. 2.  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  3. Otherwise unremarkable brain MRI for age.  Patient received hospital and inpatient rehab SLP services 05/08/2019 - 05/18/2019. Documentation shows improvement over the course of treatment.  The patient received speech therapy at this facility 10/12/2018 - 11/23/2018 for cognitive communication deficits.       Treatment Provided   Treatment provided  Cognitive-Linquistic      Pain Assessment   Pain Assessment  No/denies pain      Cognitive-Linquistic Treatment   Treatment focused on  Aphasia    Skilled Treatment  Western Aphasia Battery- Revised     Assessment / Recommendations / Plan   Plan  Discharge SLP treatment due to (comment);All goals met      Progression Toward Goals   Progression toward goals  Goals met, education completed, patient discharged from [place]- Revised   Spontaneous Speech      Information content  9/10       Fluency   9/10      Comprehension     Yes/No questions  60/60        Auditory Word Recognition 59/60        Sequential Commands 75/80     Repetition   90/100  Naming    Object Naming  44/60        Word Fluency   5/20        Sentence Completion 10/10        Responsive Speech   10/10      Aphasia Quotient  87.2/100   SLP Education - 07/01/19 1302    Education Details  Stay active, read more, try word puzzle books, have conversations    Person(s) Educated  Patient    Methods  Explanation    Comprehension  Verbalized understanding         SLP Long Term Goals - 07/01/19 1304      SLP LONG TERM GOAL #1   Title  Patient will name common objects with 80% accuracy.    Status  Achieved      SLP LONG TERM GOAL #2   Title  Patient will generate grammatical and cogent sentence to complete simple/concrete linguistic task with 80% accuracy.    Status  Achieved      SLP LONG TERM GOAL #3   Title   Patient will complete moderate auditory attention/vigilance/memory tasks with 80% accuracy.    Status  Achieved      SLP LONG TERM GOAL #4   Title  Patient will write grammatical and cogent phrase to complete simple/concrete linguistic task with 80% accuracy.    Status  Deferred      SLP LONG TERM GOAL #5   Title  Patient will demonstrate reading comprehension for sentences with 80% accuracy.    Status  Deferred       Plan - 07/01/19 1303    Clinical Impression Statement  Re-testing with the Western Aphasia Battery- Revised (Aphasia Quotient = 87.7/100).  This represents a significant improvement over her scores one month ago.  She has made gains in spontaneous speech (information content and fluency), auditory comprehension (sequential commands), repetition, and naming/word finding.  Word finding continues to be problematic, however she is at her baseline, demonstrates functional communication with min assistance, and feels ready for discharge from speech therapy at this time.    Speech Therapy Frequency  Other (comment)   Discharge   Duration  Other (comment)    Treatment/Interventions  Language facilitation;Compensatory techniques;Multimodal communcation approach;Patient/family education    Potential Considerations  Ability to learn/carryover information;Previous level of function;Severity of impairments;Cooperation/participation level;Family/community support    Consulted and Agree with Plan of Care  Patient       Patient will benefit from skilled therapeutic intervention in order to improve the following deficits and impairments:   Aphasia    Problem List Patient Active Problem List   Diagnosis Date Noted  . Diabetes mellitus type 2 in nonobese (HCC)   . Transaminitis   . Chronic bilateral low back pain without sciatica   . TBI (traumatic brain injury) ([place]) 05/12/2019  . Aphasia 05/08/2019  . Diabetes mellitus type 2 in obese ([place]) 05/08/2019  . Hypothyroidism (acquired)  05/08/2019  . Altered mental status   . Pyrexia   . Seizure ([place])   . Encephalitis   . Seizures ([place]) 04/24/2016  . Acute respiratory failure ([place]) 04/24/2016   [name], [name]  Lou Miner 07/01/2019, 1:06 PM  [place] MAIN [facility] SERVICES [address] [place], [place], [address] Phone: [phone]   Fax:  [phone]   Name: Cathy Harrington MRN: [id number] Date of Birth: [birth year]

## 2019-07-02 NOTE — Therapy (Signed)
[place] MAIN [facility] SERVICES [address] [place], [place], [address] Phone: [phone]   Fax:  [phone]  Occupational Therapy Treatment  Patient Details  Name: Cathy Harrington MRN: [id number] Date of Birth: [birth year] No data recorded  Encounter Date: 06/28/2019  OT End of Session - 07/02/19 1120    Visit Number  6    Number of Visits  12    Date for OT Re-Evaluation  07/05/19    OT Start Time  1101    OT Stop Time  1145    OT Time Calculation (min)  44 min    Activity Tolerance  Patient tolerated treatment well    Behavior During Therapy  [facility] for tasks assessed/performed       Past Medical History:  Diagnosis Date  . Diabetes mellitus without complication ([place])   . 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 Hypothyroidism   . Thyroid disease     Past Surgical History:  Procedure Laterality Date  . BACK SURGERY  2000  . BREAST SURGERY    . COLONOSCOPY    . COLONOSCOPY WITH PROPOFOL N/A 01/19/2018   Procedure: COLONOSCOPY WITH PROPOFOL;  Surgeon: Lollie Sails, MD;  Location: [facility] ENDOSCOPY;  Service: Endoscopy;  Laterality: N/A;  . parathyroidectomy      There were no vitals filed for this visit.  Subjective Assessment - 07/02/19 1119    Subjective   Patient reports her speech is getting better over time.    Pertinent History  73 yo Female reports 2 weeks ago she was cooking and she reports that while she was cooking a pot of rice fell and she was trying to put it in the sink and became disoriented and was unable to call her husbands name or tell him what was wrong. Her husband brought her to the emergency room. She was transferred to [facility] and was doing well, being able to walk with supervision without AD. She does report having difficulty speaking with impaired memory. She denies any recent falls. She  denies any numbness/tingling; Patient does speak arabic as her primary language but does understand most [place]; She reports 3 years ago she was sick and reports since then she was having trouble with her memory and word finding.Patient has a history of type II diabetes, thyroid disease (TSH), viral encephalitis, HLD, Insomnia, overweight, DDD lumbar, memory deficits.    Patient Stated Goals  To be independent again and take care of herself and her family. She would like to be able to speak again normally.    Currently in Pain?  No/denies    Pain Score  0-No pain         Patient seen for UE strengthening with use of UBE in sitting for 5 mins, forwards/backwards and alternating levels of resistance of 4.0 to 4.3 with therapist in constant attendance to ensure grip and adjust settings.  Patient performing 3# weight exercises for shoulder flexion, ABD, elbow flexion/ext, chest press for 2 sets of 12 repetitions each.   Self care/ADL/IADL Patient was seen for completion of Math problems for grocery shopping, patient was able to complete with 100% accuracy with addition, subtraction, multiplication and division.  Patient's goals update this date to reflect progress.  Patient is independent with basic self care tasks and light homemaking skills, has returned to being able  to complete cooking tasks.  She is still unable to complete mopping and vacuuming but her husband has been assisting with these tasks.  She feels she is around 80% back to normal activities.   Response to tx: Patient has progressed well and has returned to most tasks, she still requires someone to walk with her around the neighborhood per MD request, she is unable to drive and has not returned to vacuuming and mopping and reports her husband has taken over these tasks.  No issues with cognitive tasks involving math, calculations, executive functioning.  Will plan to prepare for discharge next session, will issue home program to continue at  home.                     OT Education - 07/02/19 1119    Education Details  [place], [facility]    Person(s) Educated  Patient    Methods  Explanation;Demonstration    Comprehension  Verbalized understanding;Returned demonstration          OT Long Term Goals - 07/01/19 1014      OT LONG TERM GOAL #1   Title  Patient will be independent with home exercise program for strengthening BUE.    Baseline  no current program    Time  6    Period  Weeks    Status  On-going      OT LONG TERM GOAL #2   Title  Patient will improve grip strength by 5# to be able to complete cutting food with modified independence.    Baseline  requires assist with cutting some food, especially meat at eval    Time  6    Period  Weeks    Status  Achieved      OT LONG TERM GOAL #3   Title  Patient will complete shower transfer with modified independence.    Baseline  supervision at eval    Time  4    Period  Weeks    Status  Achieved      OT LONG TERM GOAL #4   Title  Patient will complete light meal preparation with modified independence.    Baseline  requires assist at eval for meal prep    Time  6    Period  Weeks    Status  Achieved      OT LONG TERM GOAL #5   Title  Patient will complete light homemaking tasks with modified independence.    Baseline  doing some dishwashing and laundry, not mopping, vacuuming    Time  6    Period  Weeks    Status  Achieved            Plan - 07/02/19 1120    Clinical Impression Statement  Patient has progressed well and has returned to most tasks, she still requires someone to walk with her around the neighborhood per MD request, she is unable to drive and has not returned to vacuuming and mopping and reports her husband has taken over these tasks.  No issues with cognitive tasks involving math, calculations, executive functioning.  Will plan to prepare for discharge next session, will issue home program to continue at home.    OT  Occupational Profile and History  Detailed Assessment- Review of Records and additional review of physical, cognitive, psychosocial history related to current functional performance    Occupational performance deficits (Please refer to evaluation for details):  ADL's;IADL's;Social Participation    Body Structure / Function /  Physical Skills  ADL;Coordination;UE functional use;Balance;Decreased knowledge of use of DME;IADL;Dexterity;FMC;Strength    Cognitive Skills  Sequencing;Memory    Psychosocial Skills  Environmental  Adaptations;Routines and Behaviors;Habits    Rehab Potential  Excellent    Clinical Decision Making  Limited treatment options, no task modification necessary    Comorbidities Affecting Occupational Performance:  May have comorbidities impacting occupational performance    Modification or Assistance to Complete Evaluation   No modification of tasks or assist necessary to complete eval    OT Frequency  2x / week    OT Duration  12 weeks    OT Treatment/Interventions  Self-care/ADL training;Therapeutic exercise;Moist Heat;Neuromuscular education;Patient/family education;Therapeutic activities;Balance training;Cryotherapy;DME and/or AE instruction;Manual Therapy    Consulted and Agree with Plan of Care  Patient       Patient will benefit from skilled therapeutic intervention in order to improve the following deficits and impairments:   Body Structure / Function / Physical Skills: ADL, Coordination, UE functional use, Balance, Decreased knowledge of use of DME, IADL, Dexterity, FMC, Strength Cognitive Skills: Sequencing, Memory Psychosocial Skills: Environmental  Adaptations, Routines and Behaviors, Habits   Visit Diagnosis: Muscle weakness (generalized)  Other lack of coordination  Cognitive communication deficit    Problem List Patient Active Problem List   Diagnosis Date Noted  . Diabetes mellitus type 2 in nonobese (HCC)   . Transaminitis   . Chronic bilateral low  back pain without sciatica   . TBI (traumatic brain injury) ([place]) 05/12/2019  . Aphasia 05/08/2019  . Diabetes mellitus type 2 in obese ([place]) 05/08/2019  . Hypothyroidism (acquired) 05/08/2019  . Altered mental status   . Pyrexia   . Seizure ([place])   . Encephalitis   . Seizures ([place]) 04/24/2016  . Acute respiratory failure ([place]) 04/24/2016   Amy T Lovett, OTR/L, CLT  Lovett,Amy 07/02/2019, 11:25 AM  [place] MAIN [facility] SERVICES [address] [place], [place], [address] Phone: [phone]   Fax:  [phone]  Name: Cathy Harrington MRN: [id number] Date of Birth: 07-18-1946

## 2019-07-02 NOTE — Therapy (Signed)
[place] MAIN [facility] SERVICES [address] [place], [place], [address] Phone: [phone]   Fax:  [phone]  Occupational Therapy Treatment/Discharge Summary  Patient Details  Name: Cathy Harrington MRN: [id number] Date of Birth: 1946-07-09 No data recorded  Encounter Date: 07/01/2019  OT End of Session - 07/02/19 1127    Visit Number  7    Number of Visits  12    Date for OT Re-Evaluation  07/05/19    OT Start Time  1000    OT Stop Time  1033    OT Time Calculation (min)  33 min    Activity Tolerance  Patient tolerated treatment well    Behavior During Therapy  [facility] for tasks assessed/performed       Past Medical History:  Diagnosis Date  . Diabetes mellitus without complication ([place])   . 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 Hypothyroidism   . Thyroid disease     Past Surgical History:  Procedure Laterality Date  . BACK SURGERY  2000  . BREAST SURGERY    . COLONOSCOPY    . COLONOSCOPY WITH PROPOFOL N/A 01/19/2018   Procedure: COLONOSCOPY WITH PROPOFOL;  Surgeon: Lollie Sails, MD;  Location: [facility] ENDOSCOPY;  Service: Endoscopy;  Laterality: N/A;  . parathyroidectomy      There were no vitals filed for this visit.  Subjective Assessment - 07/02/19 1126    Subjective   Patient reports she feels she is ready for discharge, is doing most all tasks at home.  Patient reports this is her last day with speech therapy as well.    Pertinent History  73 yo Female reports 2 weeks ago she was cooking and she reports that while she was cooking a pot of rice fell and she was trying to put it in the sink and became disoriented and was unable to call her husbands name or tell him what was wrong. Her husband brought her to the emergency room. She was transferred to [facility] and was doing well, being able to walk with  supervision without AD. She does report having difficulty speaking with impaired memory. She denies any recent falls. She denies any numbness/tingling; Patient does speak arabic as her primary language but does understand most [place]; She reports 3 years ago she was sick and reports since then she was having trouble with her memory and word finding.Patient has a history of type II diabetes, thyroid disease (TSH), viral encephalitis, HLD, Insomnia, overweight, DDD lumbar, memory deficits.    Patient Stated Goals  To be independent again and take care of herself and her family. She would like to be able to speak again normally.    Currently in Pain?  No/denies    Multiple Pain Sites  No        Patient seen this date for discharge session.  Therapeutic Exercise: Reassessment of hand skills as follows:  Grip strength right: 46#, Left 45# Lateral pinch R 16#, left 17# 3 point pinch R 14#, left 14# 2 point pinch R 10#, L 12# 9 hole peg tests R 24 sec, L 26 sec  Issued written home program for fine motor coordination tasks and patient demonstrating each item on list. Goals updated to reflect progress.   Response to tx:  Patient has progressed well and is ready for discharge this date.  Goals were updated and met.  Patient able to demonstrate home exercise program and will continue at home daily.   Pt has returned to baseline level of function other than driving, mopping and vacuuming. Will plan for discharge this date with No further OT needs at this time.                    OT Education - 07/02/19 1126    Education Details  HEP    Person(s) Educated  Patient    Methods  Explanation;Demonstration    Comprehension  Verbalized understanding;Returned demonstration          OT Long Term Goals - 07/01/19 1014      OT LONG TERM GOAL #1   Title  Patient will be independent with home exercise program for strengthening BUE.    Baseline  no current program    Time  6     Period  Weeks    Status  Achieved      OT LONG TERM GOAL #2   Title  Patient will improve grip strength by 5# to be able to complete cutting food with modified independence.    Baseline  requires assist with cutting some food, especially meat at eval    Time  6    Period  Weeks    Status  Achieved      OT LONG TERM GOAL #3   Title  Patient will complete shower transfer with modified independence.    Baseline  supervision at eval    Time  4    Period  Weeks    Status  Achieved      OT LONG TERM GOAL #4   Title  Patient will complete light meal preparation with modified independence.    Baseline  requires assist at eval for meal prep    Time  6    Period  Weeks    Status  Achieved      OT LONG TERM GOAL #5   Title  Patient will complete light homemaking tasks with modified independence.    Baseline  doing some dishwashing and laundry, not mopping, vacuuming    Time  6    Period  Weeks    Status  Achieved            Plan - 07/02/19 1127    Clinical Impression Statement  Patient has progressed well and is ready for discharge this date.  Goals were updated and met.  Patient able to demonstrate home exercise program and will continue at home daily.   Pt has returned to baseline level of function other than driving, mopping and vacuuming. Will plan for discharge this date with No further OT needs at this time.    OT Occupational Profile and History  Detailed Assessment- Review of Records and additional review of physical, cognitive, psychosocial history related to current functional performance    Occupational performance deficits (Please refer to evaluation for details):  ADL's;IADL's;Social Participation    Body Structure / Function / Physical Skills  ADL;Coordination;UE functional use;Balance;Decreased knowledge of use of DME;IADL;Dexterity;FMC;Strength    Cognitive Skills  Sequencing;Memory    Psychosocial Skills  Environmental  Adaptations;Routines and Behaviors;Habits     Clinical Decision Making  Limited treatment options, no task modification necessary    Comorbidities Affecting Occupational Performance:  May have comorbidities impacting occupational performance    Modification or Assistance to Complete Evaluation   No modification of tasks or assist necessary to complete eval  OT Frequency  2x / week    OT Duration  12 weeks    OT Treatment/Interventions  Self-care/ADL training;Therapeutic exercise;Moist Heat;Neuromuscular education;Patient/family education;Therapeutic activities;Balance training;Cryotherapy;DME and/or AE instruction;Manual Therapy    Consulted and Agree with Plan of Care  Patient       Patient will benefit from skilled therapeutic intervention in order to improve the following deficits and impairments:   Body Structure / Function / Physical Skills: ADL, Coordination, UE functional use, Balance, Decreased knowledge of use of DME, IADL, Dexterity, FMC, Strength Cognitive Skills: Sequencing, Memory Psychosocial Skills: Environmental  Adaptations, Routines and Behaviors, Habits   Visit Diagnosis: Muscle weakness (generalized)  Other lack of coordination  Cognitive communication deficit    Problem List Patient Active Problem List   Diagnosis Date Noted  . Diabetes mellitus type 2 in nonobese (HCC)   . Transaminitis   . Chronic bilateral low back pain without sciatica   . TBI (traumatic brain injury) ([place]) 05/12/2019  . Aphasia 05/08/2019  . Diabetes mellitus type 2 in obese ([place]) 05/08/2019  . Hypothyroidism (acquired) 05/08/2019  . Altered mental status   . Pyrexia   . Seizure ([place])   . Encephalitis   . Seizures ([place]) 04/24/2016  . Acute respiratory failure [facility]) 04/24/2016   Cullen Vanallen T Tomasita Morrow, OTR/L, CLT  Camri Molloy 07/02/2019, 12:19 PM  [place] MAIN [facility] SERVICES [address] [place], [place], [address] Phone: [phone]   Fax:  [phone]  Name: MADALYNNE GUTMANN MRN:  [id number] Date of Birth: [birth year]

## 2019-07-05 ENCOUNTER — Ambulatory Visit: Payer: Medicare Other | Admitting: Occupational Therapy

## 2019-07-05 ENCOUNTER — Ambulatory Visit: Payer: Medicare Other | Admitting: Speech Pathology

## 2019-07-05 ENCOUNTER — Ambulatory Visit: Payer: Medicare Other | Admitting: Physical Therapy

## 2019-07-07 ENCOUNTER — Encounter: Payer: Medicare Other | Admitting: Occupational Therapy

## 2019-07-07 ENCOUNTER — Ambulatory Visit: Payer: Medicare Other | Admitting: Physical Therapy

## 2019-07-07 ENCOUNTER — Encounter: Payer: Medicare Other | Admitting: Speech Pathology

## 2019-07-09 ENCOUNTER — Other Ambulatory Visit: Payer: Self-pay | Admitting: Physical Medicine and Rehabilitation

## 2019-07-14 ENCOUNTER — Encounter: Payer: Self-pay | Admitting: Physical Medicine & Rehabilitation

## 2019-07-14 ENCOUNTER — Encounter: Payer: Medicare Other | Attending: Registered Nurse | Admitting: Physical Medicine & Rehabilitation

## 2019-07-14 ENCOUNTER — Other Ambulatory Visit: Payer: Self-pay

## 2019-07-14 VITALS — BP 127/84 | HR 87 | Temp 97.3°F | Ht 61.0 in | Wt 176.4 lb

## 2019-07-14 DIAGNOSIS — R569 Unspecified convulsions: Secondary | ICD-10-CM | POA: Insufficient documentation

## 2019-07-14 DIAGNOSIS — E1169 Type 2 diabetes mellitus with other specified complication: Secondary | ICD-10-CM | POA: Insufficient documentation

## 2019-07-14 DIAGNOSIS — R4701 Aphasia: Secondary | ICD-10-CM | POA: Insufficient documentation

## 2019-07-14 DIAGNOSIS — M545 Low back pain: Secondary | ICD-10-CM | POA: Insufficient documentation

## 2019-07-14 DIAGNOSIS — G8929 Other chronic pain: Secondary | ICD-10-CM | POA: Diagnosis present

## 2019-07-14 DIAGNOSIS — E669 Obesity, unspecified: Secondary | ICD-10-CM | POA: Insufficient documentation

## 2019-07-14 NOTE — Patient Instructions (Signed)
[employer] website   Please have your husband start his lesson
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
## 2019-10-13 ENCOUNTER — Ambulatory Visit: Payer: Medicare Other | Admitting: Physical Medicine & Rehabilitation

## 2019-11-24 ENCOUNTER — Ambulatory Visit: Payer: Medicare Other | Attending: Internal Medicine

## 2019-11-24 DIAGNOSIS — Z23 Encounter for immunization: Secondary | ICD-10-CM

## 2019-11-24 NOTE — Progress Notes (Signed)
   Covid-19 Vaccination Clinic  Name:  ELICIA GRAPES    MRN: [id number] DOB: Feb 16, 1946  11/24/2019  Ms. Ruff was observed post Covid-19 immunization for 15 minutes without incidence. She was provided with Vaccine Information Sheet and instruction to access the V-Safe system.   Ms. Custis was instructed to call 911 with any severe reactions post vaccine: Marland Kitchen Difficulty breathing  . Swelling of your face and throat  . A fast heartbeat  . A bad rash all over your body  . Dizziness and weakness    Immunizations Administered    Name Date Dose VIS Date Route   Pfizer COVID-19 Vaccine 11/24/2019  1:57 PM 0.3 mL 10/14/2019 Intramuscular   Manufacturer: [place]   Lot: [id number]   [place]: [id number]

## 2019-12-15 ENCOUNTER — Ambulatory Visit: Payer: Medicare Other | Attending: Internal Medicine

## 2019-12-15 DIAGNOSIS — Z23 Encounter for immunization: Secondary | ICD-10-CM | POA: Insufficient documentation

## 2019-12-15 NOTE — Progress Notes (Signed)
   Covid-19 Vaccination Clinic  Name:  Cathy Harrington    MRN: [id number] DOB: 02-Apr-1946  12/15/2019  Ms. Cathy Harrington was observed post Covid-19 immunization for 15 minutes without incidence. She was provided with Vaccine Information Sheet and instruction to access the V-Safe system.   Ms. Cathy Harrington was instructed to call 911 with any severe reactions post vaccine: Marland Kitchen Difficulty breathing  . Swelling of your face and throat  . A fast heartbeat  . A bad rash all over your body  . Dizziness and weakness    Immunizations Administered    Name Date Dose VIS Date Route   Pfizer COVID-19 Vaccine 12/15/2019  2:34 PM 0.3 mL 10/14/2019 Intramuscular   Manufacturer: [place]   Lot: [id number]   [place]: [id number]

## 2021-05-21 IMAGING — MR MRI HEAD WITHOUT CONTRAST
10 of 11 series · 43 of 48 positions shown · non-contrast
Comparison: Prior CT from 05/07/2019.

CLINICAL DATA: Initial evaluation for acute aphasia,
encephalopathy.

EXAM:
MRI HEAD WITHOUT CONTRAST
TECHNIQUE: Multiplanar, multiecho pulse sequences of the brain and surrounding
structures were obtained without intravenous contrast.

[Series 5: DWI · axial · 3.0mm · 0.88mm/px · z∈[-53,+94]mm · 10 of 100 slices shown (1 of 4)]
[im 1/100]
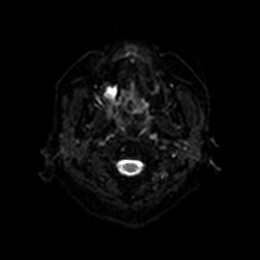
[im 12/100]
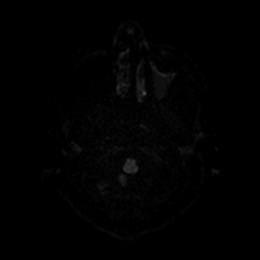
[im 23/100]
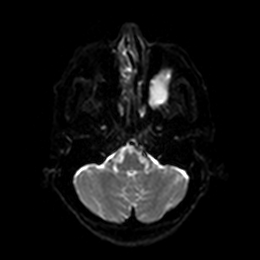
[im 34/100]
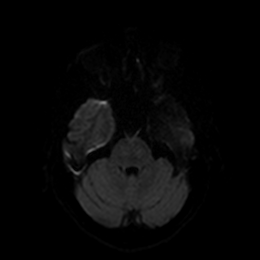
[im 45/100]
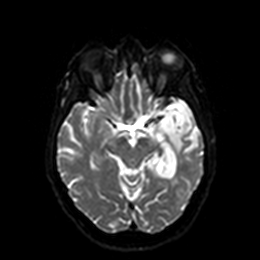
[im 56/100]
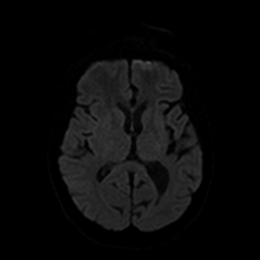
[im 67/100]
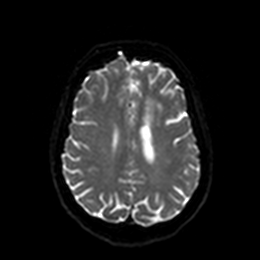
[im 78/100]
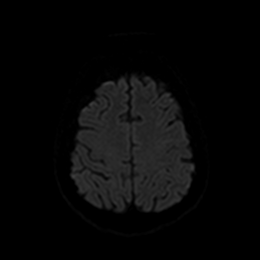
[im 89/100]
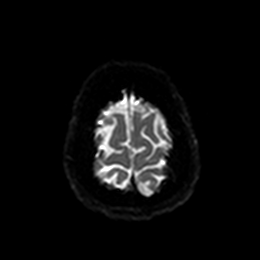
[im 100/100]
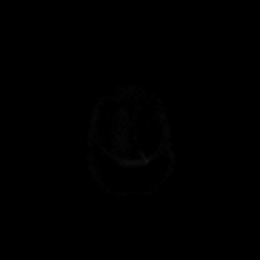

[Series 6: DWI · axial · 3.0mm · 0.88mm/px · z∈[-53,+94]mm · 5 of 50 slices shown (2 of 4)]
[im 1/50]
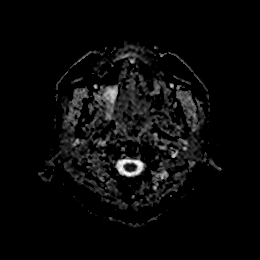
[im 13/50]
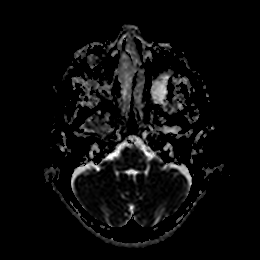
[im 25/50]
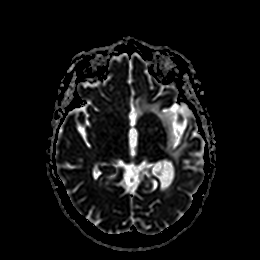
[im 37/50]
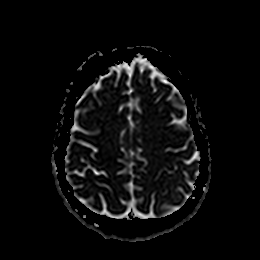
[im 50/50]
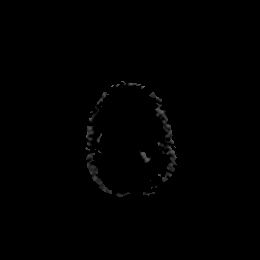

[Series 7: FLAIR · axial · 5.0mm · 0.45mm/px · z∈[-53,+91]mm · 2 of 25 slices shown]
[im 1/25]
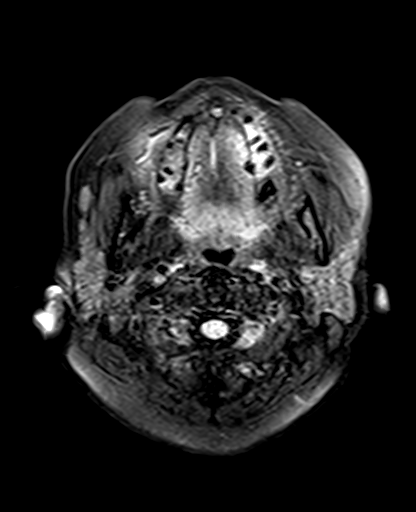
[im 25/25]
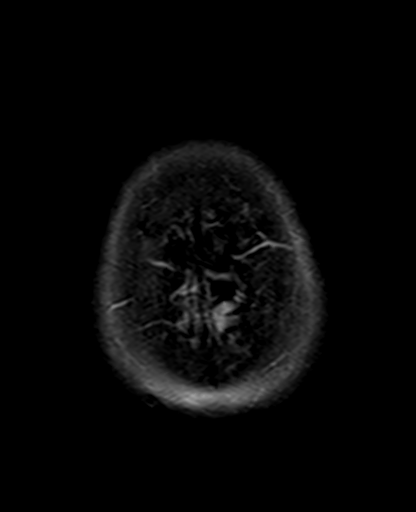

[Series 9: pha_images · axial · 3.0mm · 0.90mm/px · z∈[-61,+92]mm · 5 of 52 slices shown]
[im 1/52]
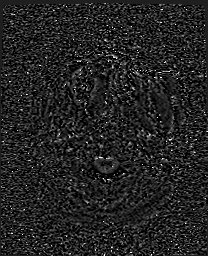
[im 13/52]
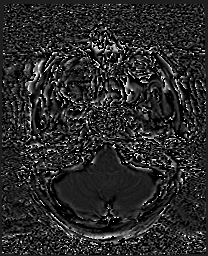
[im 26/52]
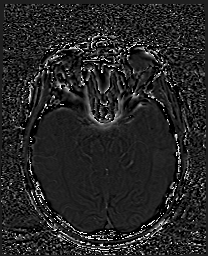
[im 39/52]
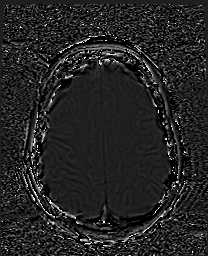
[im 52/52]
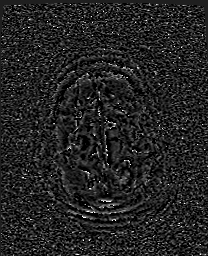

[Series 10: swi_images · axial · 3.0mm · 0.90mm/px · z∈[-61,+92]mm · 5 of 52 slices shown]
[im 1/52]
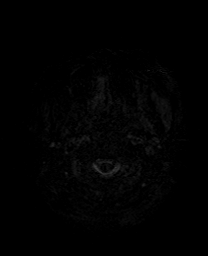
[im 13/52]
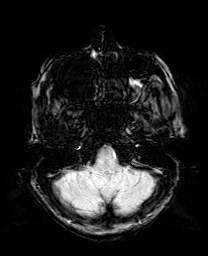
[im 26/52]
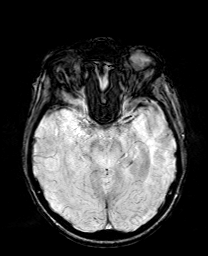
[im 39/52]
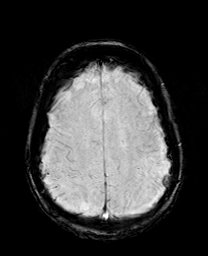
[im 52/52]
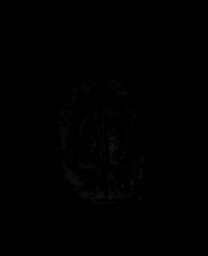

[Series 12: T2 · axial · 5.0mm · 0.72mm/px · z∈[-51,+92]mm · 2 of 25 slices shown (1 of 2)]
[im 1/25]
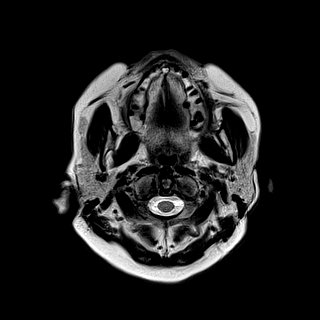
[im 25/25]
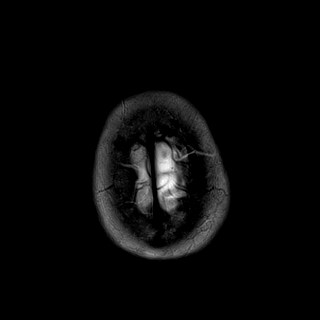

[Series 13: DWI · coronal · 4.0mm · 0.88mm/px · 6 of 64 slices shown (3 of 4)]
[im 1/64]
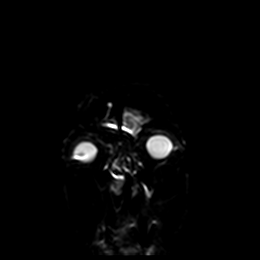
[im 13/64]
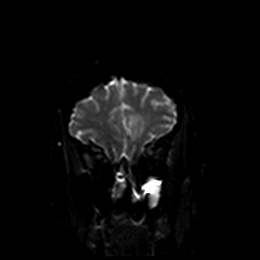
[im 26/64]
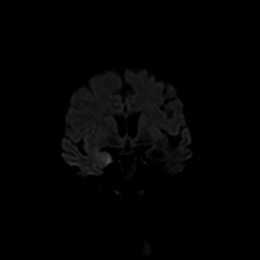
[im 38/64]
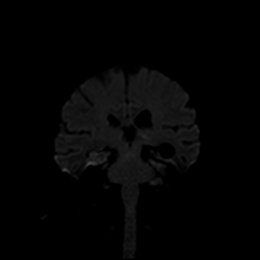
[im 51/64]
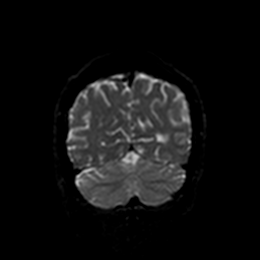
[im 64/64]
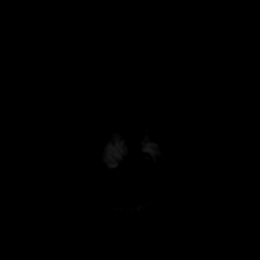

[Series 14: DWI · coronal · 4.0mm · 0.88mm/px · 3 of 32 slices shown (4 of 4)]
[im 1/32]
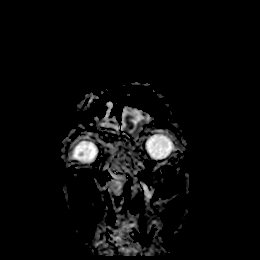
[im 16/32]
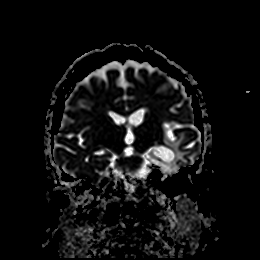
[im 32/32]
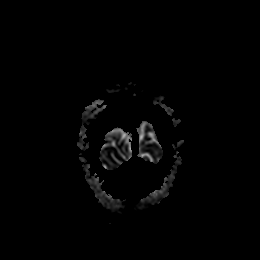

[Series 15: T1 · sagittal · 5.0mm · 0.75mm/px · 2 of 23 slices shown]
[im 1/23]
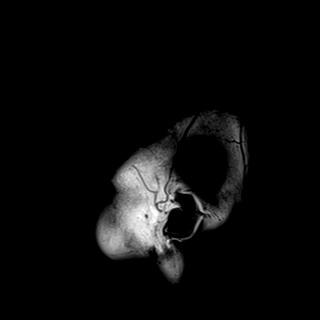
[im 23/23]
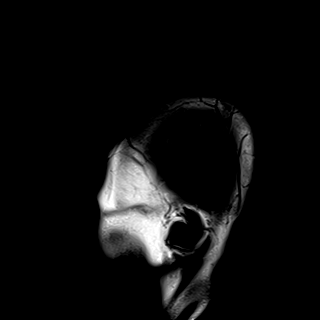

[Series 17: T2 · coronal · 5.0mm · 0.34mm/px · 3 of 27 slices shown (2 of 2)]
[im 1/27]
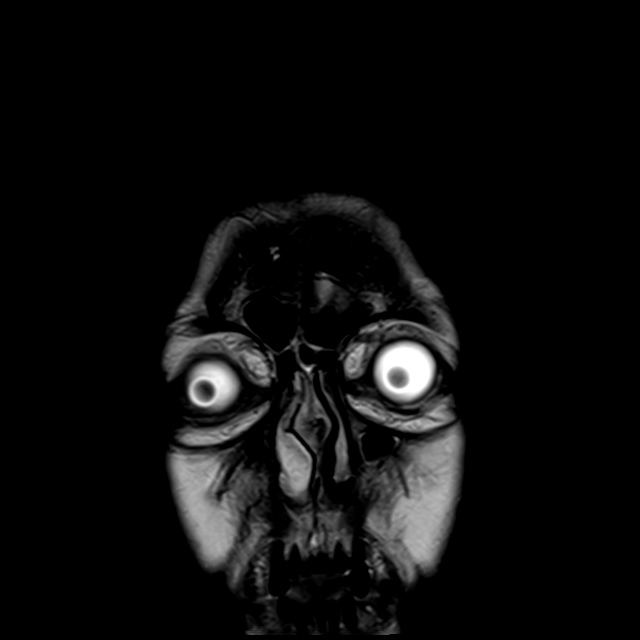
[im 14/27]
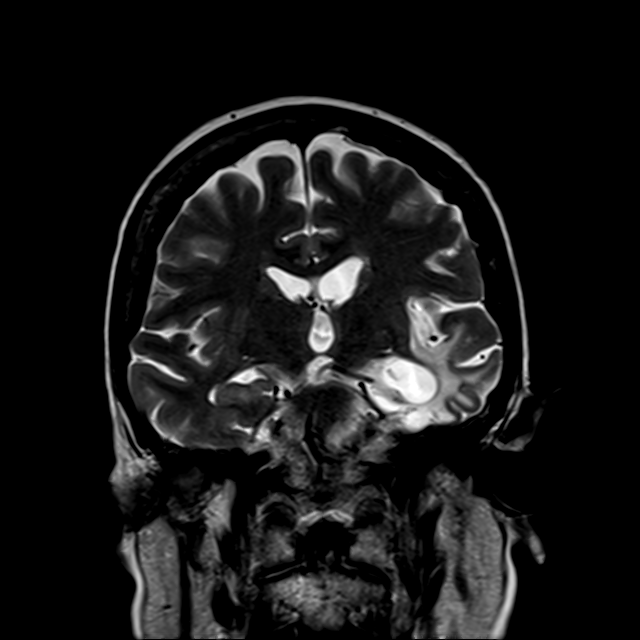
[im 27/27]
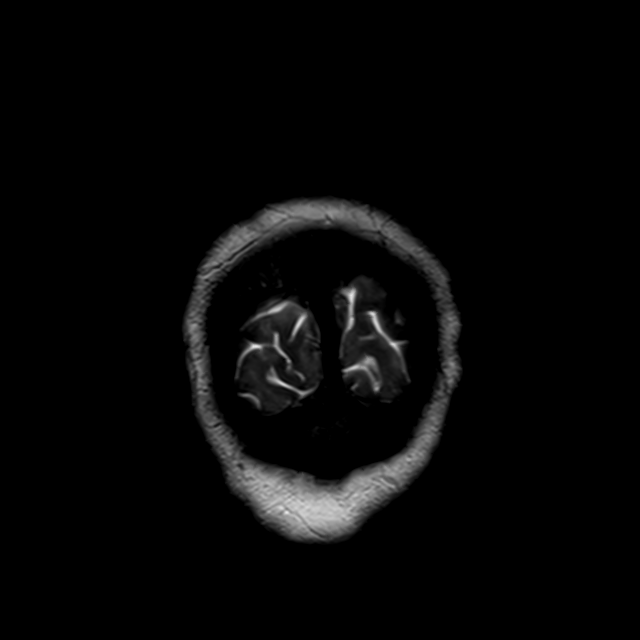

[43 of 48 positions shown; findings below may reference images not displayed]

FINDINGS: Brain: Cerebral volume within normal limits for age. Chronic
encephalomalacia and gliosis involving the anterior left insula and
anterior left temporal lobe, stable. Focal encephalomalacia
involving the anterior left cingulate also unchanged.

No abnormal foci of restricted diffusion to suggest acute or
subacute ischemia. Gray-white matter differentiation maintained. No
other areas of chronic cortical infarction. No evidence for acute
intracranial hemorrhage.

No mass lesion, midline shift or mass effect. Ex vacuo dilatation of
the temporal horn of the left lateral ventricle related to the left
temporal encephalomalacia. No hydrocephalus. No extra-axial fluid
collection. Pituitary gland suprasellar region normal. Midline
structures intact.

Vascular: Major intracranial vascular flow voids are well
maintained.

Skull and upper cervical spine: Craniocervical junction within
normal limits. Upper cervical spine normal. Bone marrow signal
intensity within normal limits. No scalp soft tissue abnormality.

Sinuses/Orbits: Globes and orbital soft tissues within normal
limits. Left maxillary sinus retention cyst noted. Paranasal sinuses
are otherwise largely clear. No significant mastoid effusion. Inner
ear structures grossly normal.

Other: None.
IMPRESSION: 1. No acute intracranial abnormality.
2. Chronic encephalomalacia involving the anterior left temporal
lobe and left cingulate gyrus, stable from previous.
3. Otherwise unremarkable brain MRI for age.

## 2021-05-21 IMAGING — CR CHEST - 2 VIEW
2 series · 2 of 2 positions shown · non-contrast
Comparison: None.

CLINICAL DATA: MRI clearance. Confusion without chest pain. History
of stroke.

EXAM:
CHEST - 2 VIEW

[chest lat]
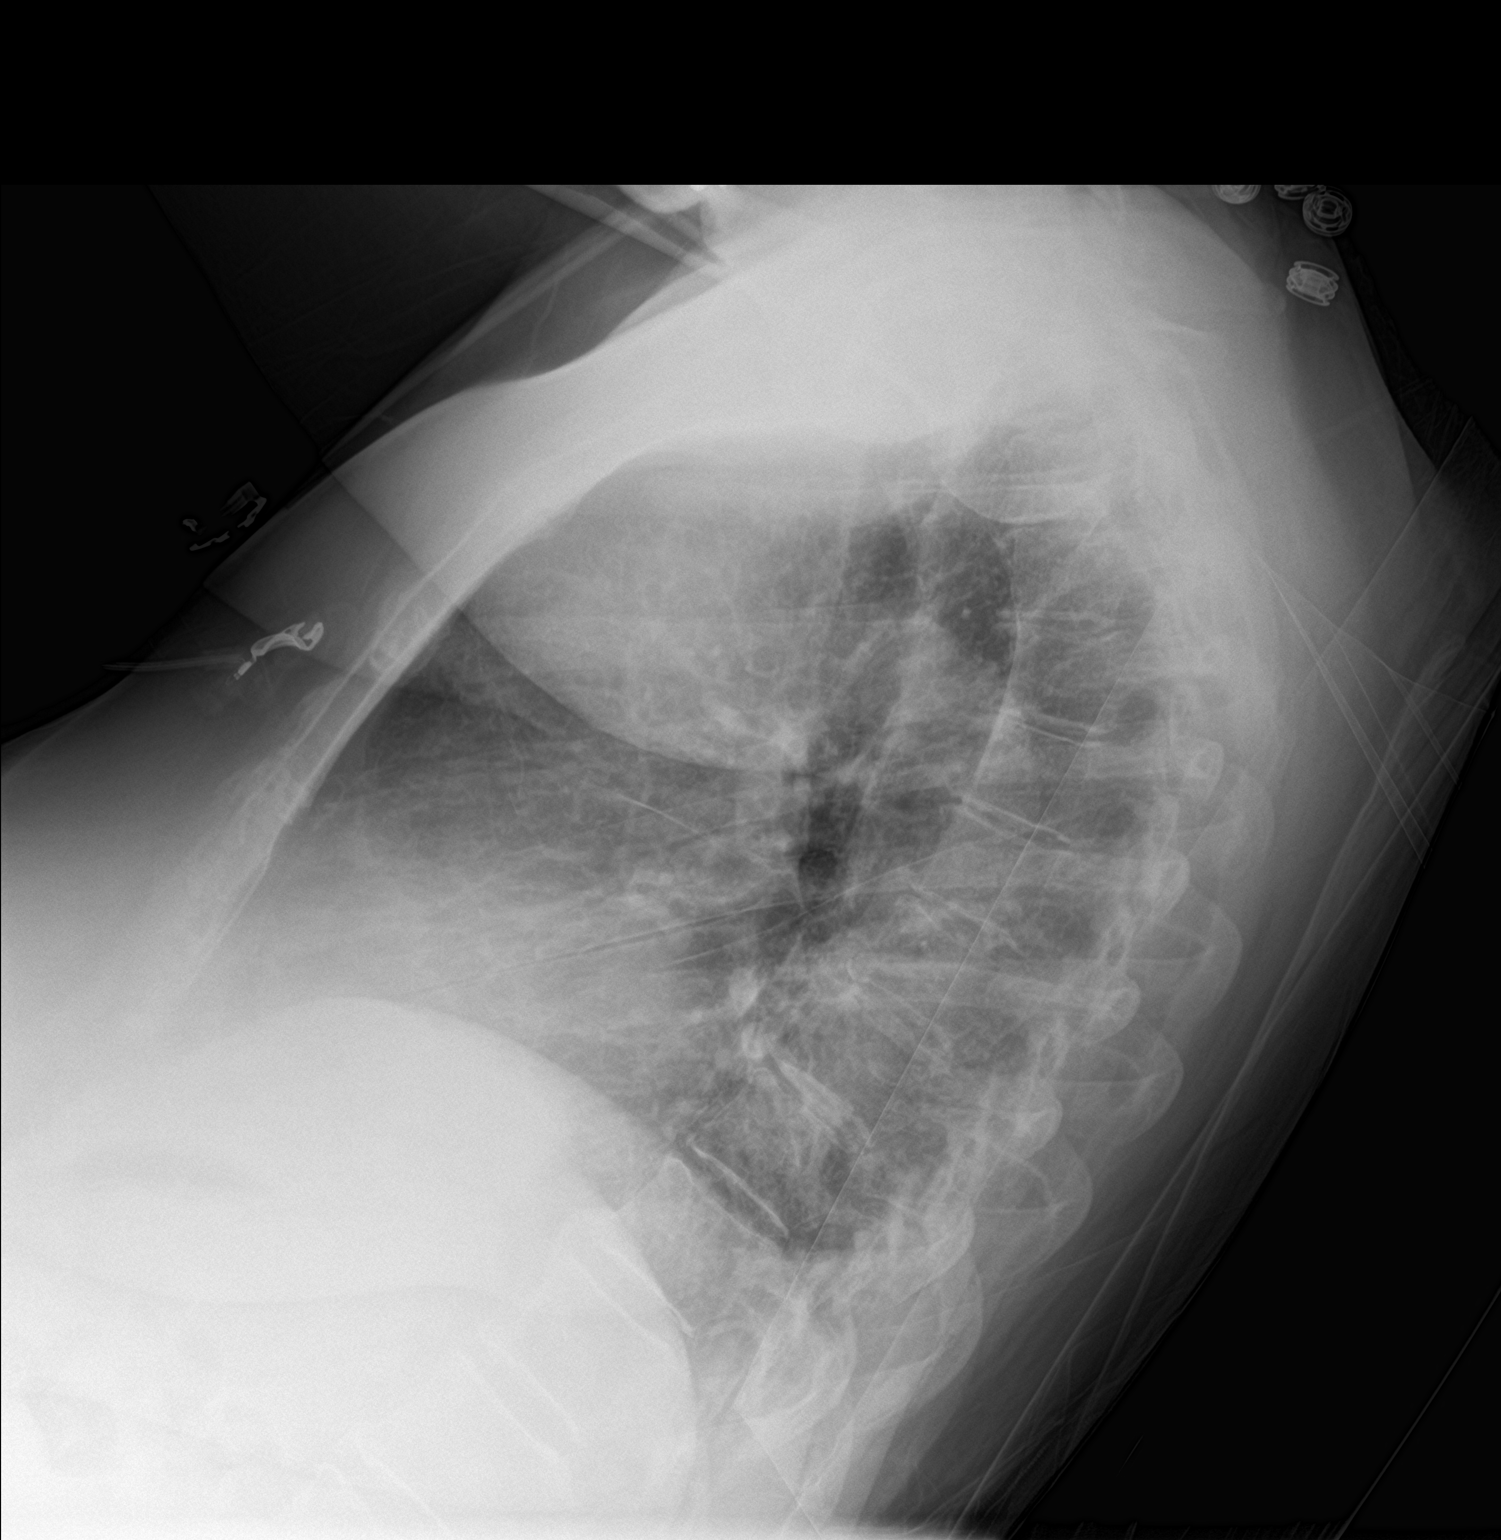

[chest ap]
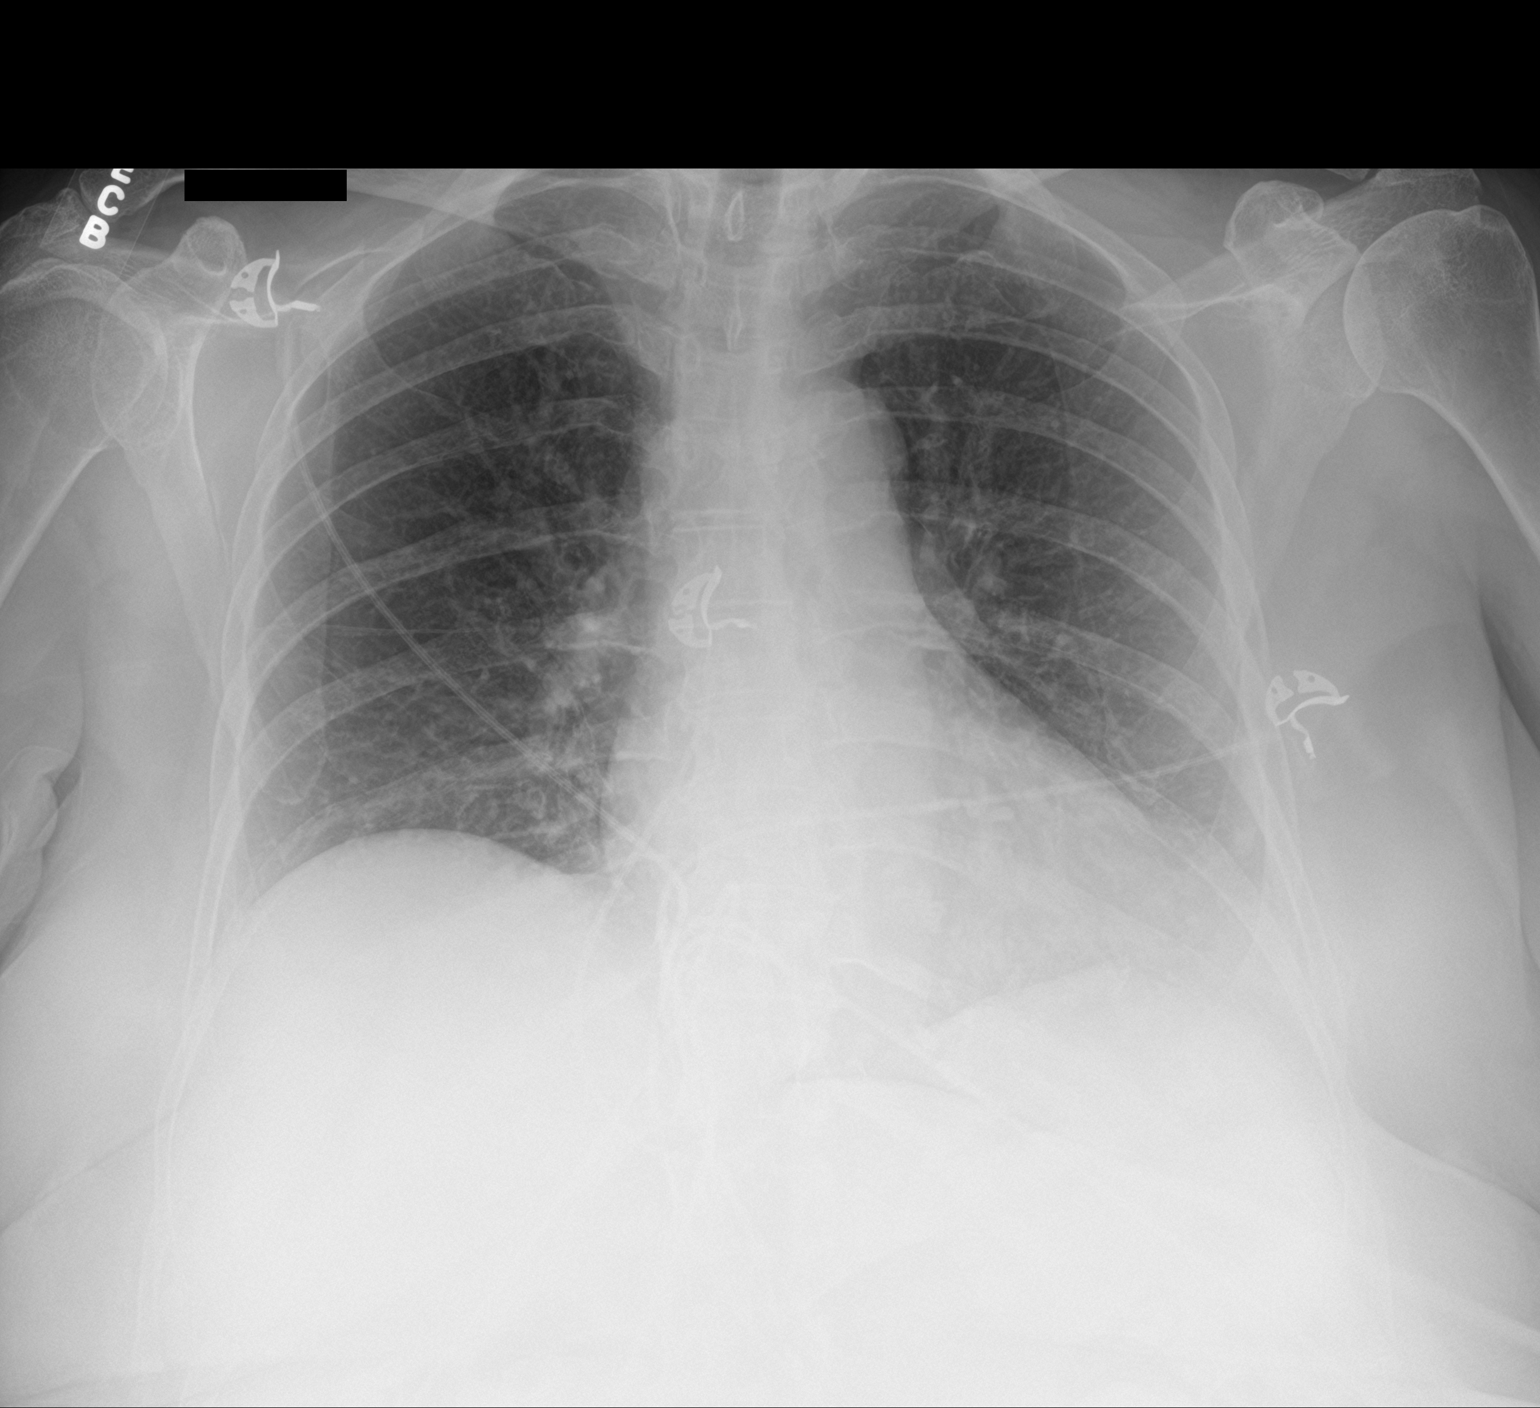

[2 of 2 positions shown; findings below may reference images not displayed]

FINDINGS: The lung volumes are somewhat low. There may be a left-sided pleural
effusion with adjacent atelectasis. There is no pneumothorax. There
is no unexpected metallic foreign body. There are mild degenerative
changes throughout the thoracic spine.
IMPRESSION: 1. Low lung volumes with probable atelectasis at the left lung base.
2. No unexpected metallic foreign body.

## 2021-07-01 NOTE — Progress Notes (Signed)
07/02/21 4:10 PM   Cathy Harrington 1946/06/20 [id number]  Referring provider:  Danae Orleans, MD [address] [place],  [place] [id number] Chief Complaint  Patient presents with   Urinary Tract Infection     HPI: Cathy Harrington is a 75 y.o.female who presents today for further evlauation of urinary tract infection.   She was seen on 06/19/2021 by her PCP, Dr. Danae Orleans, with acute UTI. Urinalysis showed large leukocyte esterase, small amount of blood, 14 RBC, >182 WBC, many WBC clumps, moderate bacteria, and rare mucus.   She is accompanied today by her husband. Her husband states she started experiencing symptoms about 2 months ago.  She denies leakage or accidents. Her husband states she experiences nocturia.  She is also had some suprapubic pressure urgency and frequency.  She is now getting up at night to urinate frequently which she never did before.  No personal history of urinary retention.  Her husband states that she is constantly under stress due to her taking care of their child who is mentally ill.   She is originally from [place] and her symptoms started after her visit to [place].   Her PVR today was 1,169 mL. She will be catheterized today.    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)    2017 1 week [facility] and 2 weeks inpatient rehab 6 months outpatient [facility]    Hypothyroidism    Thyroid disease     Surgical History: Past Surgical History:  Procedure Laterality Date   BACK SURGERY  2000   BREAST SURGERY     COLONOSCOPY     COLONOSCOPY WITH PROPOFOL N/A 01/19/2018   Procedure: COLONOSCOPY WITH PROPOFOL;  Surgeon: Lollie Sails, MD;  [facility] END[facility]COPY;  Service: Endoscopy;  Laterality: N/A;   parathyroidectomy      Home Medications:  Allergies as of 07/02/2021       Reactions   Tetanus Toxoid Rash         Medication List        Accurate as of July 02, 2021  4:10 PM. If you have any questions, ask your nurse or doctor.          STOP taking these medications    aspirin EC 81 MG tablet Stopped by: Hollice Espy, MD       TAKE these medications    levETIRAcetam 1000 MG tablet Commonly known as: KEPPRA Take 1 tablet (1,000 mg total) by mouth 2 (two) times daily.   levothyroxine 100 MCG tablet Commonly known as: SYNTHROID Take 100 mcg by mouth daily.   lovastatin 10 MG tablet Commonly known as: MEVACOR Take 10 mg by mouth at bedtime.   metFORMIN 500 MG tablet Commonly known as: GLUCOPHAGE Take 1 tablet (500 mg total) by mouth 2 (two) times daily with a meal.   valproic acid 250 MG capsule Commonly known as: DEPAKENE Take 1 capsule (250 mg total) by mouth 3 (three) times daily.        Allergies:  Allergies  Allergen Reactions   Tetanus Toxoid Rash    Family History: Family History  Problem Relation Age of Onset   Diabetes Brother     Social History:  reports that she has quit smoking. She has never used smokeless tobacco. She reports that she does not drink alcohol and does not use drugs.  Physical Exam: BP 137/82   Pulse 98   Ht '5\' 1"'$  (1.549 m)   Wt 160 lb (72.6 kg)   BMI 30.23 kg/m   Constitutional:  Alert and oriented, No acute distress. HEENT: [place] AT, moist mucus membranes.  Trachea midline, no masses. Cardiovascular: No clubbing, cyanosis, or edema. Respiratory: Normal respiratory effort, no increased work of breathing. Skin: No rashes, bruises or suspicious lesions. Neurologic: Grossly intact, no focal deficits, moving all 4 extremities. Psychiatric: Normal mood and affect.  Laboratory Data:  Lab Results  Component Value Date   CREATININE 0.85 05/18/2019     Lab Results  Component Value Date   HGBA1C 7.2 (H) 05/09/2019    Pertinent Imaging: Results for orders placed or performed in visit on 07/02/21  Bladder Scan  (Post Void Residual) in office  Result Value Ref Range   Scan Result 1,169     Assessment & Plan:    Urinary retention  - PVR today 1,169 mL  - suspect she has neurogenic bladder component based on the lack of extreme discomfort today, is likely somewhat chronic in nature - catheter for a week for urinary decompression x 1 week - may need to self-cath - will check to make sure she is not experiencing kidney failure, BMP today - recommend cystoscopy to rule out bladder cancer and obstruction given fairly unusual presentation  Recurrent UTI  - Likely due to urinary retention  - UA today was clear, recently completed antibiotics.   Voiding trial next week with CIC teaching, cystoscopy to follow  I,Kailey Littlejohn,acting as a scribe for Hollice Espy, MD.,have documented all relevant documentation on the behalf of Hollice Espy, MD,as directed by  Hollice Espy, MD while in the presence of Hollice Espy, MD.  I have reviewed the above documentation for accuracy and completeness, and I agree with the above.   Hollice Espy, MD  [facility] [address] [place], [place] [address] [phone]   I spent 45 total minutes on the day of the encounter including pre-visit review of the medical record, face-to-face time with the patient, and post visit ordering of labs/imaging/tests.

## 2021-07-02 ENCOUNTER — Ambulatory Visit (INDEPENDENT_AMBULATORY_CARE_PROVIDER_SITE_OTHER): Payer: Medicare Other | Admitting: Urology

## 2021-07-02 ENCOUNTER — Encounter: Payer: Self-pay | Admitting: Urology

## 2021-07-02 ENCOUNTER — Other Ambulatory Visit: Payer: Self-pay

## 2021-07-02 VITALS — BP 137/82 | HR 98 | Ht 61.0 in | Wt 160.0 lb

## 2021-07-02 DIAGNOSIS — R339 Retention of urine, unspecified: Secondary | ICD-10-CM

## 2021-07-02 DIAGNOSIS — N390 Urinary tract infection, site not specified: Secondary | ICD-10-CM

## 2021-07-02 LAB — BLADDER SCAN AMB NON-IMAGING: Scan Result: 1169

## 2021-07-02 NOTE — Progress Notes (Signed)
Cath Placement  Patient is present today for a catheter change due to urinary retention.  1544m of water was removed from the balloon, a 16FR foley cath was removed with out difficulty.  Patient was cleaned and prepped in a sterile fashion with betadine. A 16 FR foley cath was replaced into the bladder no complications were noted Urine return was noted 15010mand urine was yellow in color. The balloon was filled with 1019mf sterile water. A leg bag was attached for drainage.   Patient was given proper instruction on catheter care.    Performed by: RebVerlene MayerMA & AliKerman PasseyMA  Follow up: 1 week voiding trial

## 2021-07-02 NOTE — Patient Instructions (Addendum)
Foley Catheter Care and Patient Education  Perform catheter care every day.  You can do this while in the shower, but NOT while taking a tub bath.  You will need the following supplies: -mild soap, such as Dove -water -a clean washcloth (not one already used for bathing) or a 4x4 piece of gauze -1 Cath-Secure -night drainage bag -2 alcohol swabs  Was you hands thoroughly with soap and water Using mild soap and water, clan your genital area Men should retract the foreskin, if needed, and clean the area, including the penis Women should separate the labia, and clean the area from front to back  3. Clean your urinary opening, which is where the catheter enters your body. 4. Clean the catheter from where it enters your body and then down, away from your body.  Hold the catheter at the point it enters your body so that you don't put tension on it. 5. Rinse the area well and dry it gently.  Changing the drainage bag You will change your drainage bag twice a day -in the morning after you shower, change he night bag to the leg bag -at night before you go to bed, change the leg bag to the night bag  Wash your hands thoroughly with soap and water Empty the urine from the drainage bag into the toilet before you change it Pinch off the catheter with your fingers and disconnect the used bag Wipe the end of the catheter using an alcohol pad Wipe the connector on the bag using the second alcohol pad Connect the clean bag to the catheter and release your finger pinch Check all connections.  Straighten any kinks or twits in the tubing  Caring for the Leg bag -always wear the leg bag below your knee.  This will help it drain. -keep the leg bag secure with the velcro straps.  If the straps leave a mark on your leg they are to tight and should be loosened.  Leaving the straps too tight can decrease you circulation and lead to blood clots. -empty the leg bag through the spout at the bottom every  2-4 hours as needed.  Do not let the bag become completely full. -do not lie down for longer than 2 hours while you are wearing the leg bag.  Caring for the Night Bag -always keep the night bag below the level of your bladder . -to hang your night bag while you sleep, place a clean plastic bag inside of a wastebasket.  Hang the night bag on the inside of the wastebasket.  Cleaning the Drainage bag Wash you hands thoroughly with soap and water. Rinse the equipment with cool water.  Do not use hot water it can damage the plastic equipment. Was the equipment with a mild liquid detergent (ivory) and rinse with cool water. To decrease odor, fill the bag halfway with a mixture of 1 part vinegar and 3 parts water. Shake the bag and let it sit for 15 minutes. Rinse the bag with cool water and hang it up to dry.  Preventing infection -keep the drainage bag below the level of your bladder and off the floor at all times. -keep the catheter secured to your thigh to prevent it from moving. -do not lie on or block the flow of urine in the tubing. -shower daily to keep the catheter clean. -clean your hands before and after touching the catheter or bag. -the spout of the drainage bag should never touch the side of   the toilet or any emptying container.  Special Points -You may see some blood or urine around where the catheter enters your body, especially when walking or having a bowel movement.  This is normal, as long as there is urine draining into the drainage bag.  If you experience significant leakage around  catheter tube where it enters your urethra possibly associated with lower abdominal cramping you could be having a bladder spasm.  Please notify your doctor and we can prescribe you a medication to improve your symptoms. -drink 1-2 glasses of liquids every 2 hours while you're awake.  Call your doctor immediately if -your catheter comes out, do not try to replace it yourself -you have  temperature of 101F (38.8C) or higher -you have decrease in the amount of urine you are making -you have foul-smelling urine -you have bright red blood or large blood clots in your urine -you have abdominal pain and no urine in your catheter bag     Cystoscopy Cystoscopy is a procedure that is used to help diagnose and sometimes treat conditions that affect the lower urinary tract. The lower urinary tract includes the bladder and the urethra. The urethra is the tube that drains urine from the bladder. Cystoscopy is done using a thin, tube-shaped instrument with a light and camera at the end (cystoscope). The cystoscope may be hard or flexible, depending on the goal of the procedure. The cystoscope is inserted through the urethra, into the bladder. Cystoscopy may be recommended if you have: Urinary tract infections that keep coming back. Blood in the urine (hematuria). An inability to control when you urinate (urinary incontinence) or an overactive bladder. Unusual cells found in a urine sample. A blockage in the urethra, such as a urinary stone. Painful urination. An abnormality in the bladder found during an intravenous pyelogram (IVP) or CT scan. Cystoscopy may also be done to remove a sample of tissue to be examined under a microscope (biopsy). What are the risks? Generally, this is a safe procedure. However, problems may occur, including: Infection. Bleeding.  What happens during the procedure?  You will be given one or more of the following: A medicine to numb the area (local anesthetic). The area around the opening of your urethra will be cleaned. The cystoscope will be passed through your urethra into your bladder. Germ-free (sterile) fluid will flow through the cystoscope to fill your bladder. The fluid will stretch your bladder so that your health care provider can clearly examine your bladder walls. Your doctor will look at the urethra and bladder. The cystoscope will be  removed The procedure may vary among health care providers  What can I expect after the procedure? After the procedure, it is common to have: Some soreness or pain in your abdomen and urethra. Urinary symptoms. These include: Mild pain or burning when you urinate. Pain should stop within a few minutes after you urinate. This may last for up to 1 week. A small amount of blood in your urine for several days. Feeling like you need to urinate but producing only a small amount of urine. Follow these instructions at home: General instructions Return to your normal activities as told by your health care provider.  Do not drive for 24 hours if you were given a sedative during your procedure. Watch for any blood in your urine. If the amount of blood in your urine increases, call your health care provider. If a tissue sample was removed for testing (biopsy) during your procedure, it  is up to you to get your test results. Ask your health care provider, or the department that is doing the test, when your results will be ready. Drink enough fluid to keep your urine pale yellow. Keep all follow-up visits as told by your health care provider. This is important. Contact a health care provider if you: Have pain that gets worse or does not get better with medicine, especially pain when you urinate. Have trouble urinating. Have more blood in your urine. Get help right away if you: Have blood clots in your urine. Have abdominal pain. Have a fever or chills. Are unable to urinate. Summary Cystoscopy is a procedure that is used to help diagnose and sometimes treat conditions that affect the lower urinary tract. Cystoscopy is done using a thin, tube-shaped instrument with a light and camera at the end. After the procedure, it is common to have some soreness or pain in your abdomen and urethra. Watch for any blood in your urine. If the amount of blood in your urine increases, call your health care  provider. If you were prescribed an antibiotic medicine, take it as told by your health care provider. Do not stop taking the antibiotic even if you start to feel better. This information is not intended to replace advice given to you by your health care provider. Make sure you discuss any questions you have with your health care provider. Document Revised: 10/12/2018 Document Reviewed: 10/12/2018 Elsevier Patient Education  [place].

## 2021-07-03 LAB — URINALYSIS, COMPLETE
Bilirubin, UA: NEGATIVE
Glucose, UA: NEGATIVE
Ketones, UA: NEGATIVE
Nitrite, UA: NEGATIVE
Protein,UA: NEGATIVE
RBC, UA: NEGATIVE
Specific Gravity, UA: 1.015 (ref 1.005–1.030)
Urobilinogen, Ur: 0.2 mg/dL (ref 0.2–1.0)
pH, UA: 5.5 (ref 5.0–7.5)

## 2021-07-03 LAB — MICROSCOPIC EXAMINATION
Bacteria, UA: NONE SEEN
RBC, Urine: NONE SEEN /hpf (ref 0–2)

## 2021-07-03 LAB — BASIC METABOLIC PANEL
BUN/Creatinine Ratio: 21 (ref 12–28)
BUN: 16 mg/dL (ref 8–27)
CO2: 24 mmol/L (ref 20–29)
Calcium: 10.1 mg/dL (ref 8.7–10.3)
Chloride: 105 mmol/L (ref 96–106)
Creatinine, Ser: 0.75 mg/dL (ref 0.57–1.00)
Glucose: 147 mg/dL — ABNORMAL HIGH (ref 65–99)
Potassium: 4.9 mmol/L (ref 3.5–5.2)
Sodium: 143 mmol/L (ref 134–144)
eGFR: 83 mL/min/{1.73_m2} (ref >59)

## 2021-07-04 ENCOUNTER — Telehealth: Payer: Self-pay | Admitting: *Deleted

## 2021-07-04 NOTE — Telephone Encounter (Addendum)
Patient informed, voiced understanding   ----- Message from Hollice Espy, MD sent at 07/03/2021  7:28 AM EDT ----- Renal function was fine, good news.  Hollice Espy, MD

## 2021-07-05 LAB — CULTURE, URINE COMPREHENSIVE

## 2021-07-10 ENCOUNTER — Encounter: Payer: Self-pay | Admitting: Physician Assistant

## 2021-07-10 ENCOUNTER — Ambulatory Visit: Payer: Medicare Other | Admitting: Physician Assistant

## 2021-07-10 ENCOUNTER — Ambulatory Visit (INDEPENDENT_AMBULATORY_CARE_PROVIDER_SITE_OTHER): Payer: Medicare Other | Admitting: Physician Assistant

## 2021-07-10 ENCOUNTER — Other Ambulatory Visit: Payer: Self-pay

## 2021-07-10 VITALS — BP 115/71 | HR 77 | Ht 62.0 in | Wt 162.0 lb

## 2021-07-10 DIAGNOSIS — R339 Retention of urine, unspecified: Secondary | ICD-10-CM

## 2021-07-10 LAB — BLADDER SCAN AMB NON-IMAGING

## 2021-07-10 NOTE — Progress Notes (Signed)
07/10/2021 5:58 PM   Mauricia A Kempfer [birth year] [id number]  CC: Chief Complaint  Patient presents with   Urinary Retention   HPI: Cathy Harrington is a 75 y.o. female with PMH diabetes and HSV encephalitis in 2017 who presents today for voiding trial.  Foley catheter was placed in clinic 1 week ago by Dr. Erlene Quan when she was found to have residual of 1169 mL in the setting of recent UTI as well as suprapubic pressure, urgency, frequency, and nocturia.  Creatinine was stable at 0.75 at that time and urine culture finalized with mixed urogenital flora.  Foley catheter was removed in clinic this morning, see separate procedure note for details.  She returned to clinic in the afternoon.  She reports she has been able to urinate 4 times.  She is having some lower abdominal pain and pressure.  PVR >967m.  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)    2017 1 week [facility] and 2 weeks inpatient rehab [facility]   Hypothyroidism    Thyroid disease     Surgical History: Past Surgical History:  Procedure Laterality Date   BACK SURGERY  2000   BREAST SURGERY     COLONOSCOPY     COLONOSCOPY WITH PROPOFOL N/A 01/19/2018   Procedure: COLONOSCOPY WITH PROPOFOL;  Surgeon: SLollie Sails MD;  [facility];  Service: Endoscopy;  Laterality: N/A;   parathyroidectomy      Home Medications:  Allergies as of 07/10/2021       Reactions   Tetanus Toxoid Rash        Medication List        Accurate as of July 10, 2021  5:58 PM. If you have any questions, ask your nurse or doctor.          levETIRAcetam 1000 MG tablet Commonly known as: KEPPRA Take 1 tablet (1,000 mg total) by mouth 2  (two) times daily.   levothyroxine 100 MCG tablet Commonly known as: SYNTHROID Take 100 mcg by mouth daily.   lovastatin 10 MG tablet Commonly known as: MEVACOR Take 10 mg by mouth at bedtime.   metFORMIN 500 MG tablet Commonly known as: GLUCOPHAGE Take 1 tablet (500 mg total) by mouth 2 (two) times daily with a meal.   valproic acid 250 MG capsule Commonly known as: DEPAKENE Take 1 capsule (250 mg total) by mouth 3 (three) times daily.        Allergies:  Allergies  Allergen Reactions   Tetanus Toxoid Rash    Family History: Family History  Problem Relation Age of Onset   Diabetes Brother     Social History:   reports that she has quit smoking. She has never used smokeless tobacco. She reports that she does not drink alcohol and does not use drugs.  Physical Exam: BP 115/71   Pulse 77   Ht '5\' 2"'$  (1.575 m)   Wt 162 lb (73.5 kg)   BMI 29.63 kg/m   Constitutional:  Alert and oriented, no acute distress, nontoxic appearing HEENT: [place], AT Cardiovascular: No clubbing, cyanosis, or edema Respiratory: Normal respiratory effort, no increased work of breathing Skin: No rashes, bruises or suspicious lesions Neurologic: Grossly intact, no focal deficits,  moving all 4 extremities Psychiatric: Normal mood and affect  Laboratory Data: Results for orders placed or performed in visit on 07/10/21  Bladder Scan (Post Void Residual) in office  Result Value Ref Range   Scan Result >954m    Simple Catheter Placement  Due to urinary retention patient is present today for a foley cath placement.  Patient was cleaned and prepped in a sterile fashion with betadine. A 16 FR foley catheter was inserted, urine return was noted  958m urine was clear in color.  The balloon was filled with 10cc of sterile water.  A leg bag was attached for drainage. Patient tolerated well, no complications were noted   Performed by: SaDebroah LoopPA-C and SaFonnie JarvisCMA  Assessment &  Plan:   1. Urinary retention Voiding trial failed today.  I had a lengthy conversation with the patient and her husband in which I offered them CIC teaching versus Foley catheter replacement with plans for repeat voiding trial in 1 week.  Ultimately, patient elected for Foley catheter replacement and repeat voiding trial.  I was very frank with the patient and her husband today that it is possible that she will not pass her voiding trial next week.  In this case, we will again be discussing CIC teaching versus long-term urinary drainage likely with Foley catheter.  She is scheduled for cystoscopy with Dr. BrErlene Quant the end of the month, which may further guide treatment. - Bladder Scan (Post Void Residual) in office  Return in about 1 week (around 07/17/2021) for Voiding trial.  SaDebroah LoopPA-C  [address] StreetSuSylvesteruHeron[place] [phone]

## 2021-07-10 NOTE — Progress Notes (Signed)
Catheter Removal  Patient is present today for a catheter removal.  53m of water was drained from the balloon. A 16FR foley cath was removed from the bladder no complications were noted . Patient tolerated well.  Performed by: SFonnie Jarvis CMA  Follow up/ Additional notes: PM PVR

## 2021-07-18 ENCOUNTER — Ambulatory Visit (INDEPENDENT_AMBULATORY_CARE_PROVIDER_SITE_OTHER): Payer: Medicare Other | Admitting: Physician Assistant

## 2021-07-18 ENCOUNTER — Other Ambulatory Visit: Payer: Self-pay

## 2021-07-18 ENCOUNTER — Ambulatory Visit: Payer: Medicare Other | Admitting: Physician Assistant

## 2021-07-18 DIAGNOSIS — R339 Retention of urine, unspecified: Secondary | ICD-10-CM | POA: Diagnosis not present

## 2021-07-18 LAB — BLADDER SCAN AMB NON-IMAGING

## 2021-07-18 NOTE — Progress Notes (Signed)
07/18/2021 9:10 AM   Keniyah A Brager Jan 22, 1946 [id number]  CC: Chief Complaint  Patient presents with   Urinary Retention    HPI: Cathy Harrington is a 75 y.o. female with PMH diabetes, HSV encephalitis, recurrent UTI and recent finding of possibly chronic urinary retention who presents today for second outpatient voiding trial.   Today she reports she is doing well, but her finds her catheter bothersome. She is wearing a leg bag and has been awakening at night to empty it, which has been disruptive to her sleep.  During the day, she has not been leaving the house because she did not think this was permissible with a urinary catheter.  She hopes she will pass her voiding trial today so she will not require catheter replacement.  Foley catheter removed in the morning, see procedure note below for more information. Patient returned to clinic this afternoon for PVR. She reports drinking 48oz of fluid. She has been able to urinate. She denies lower abdominal pain or dysuria. PVR 827m.  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)    2017 1 week [facility] and 2 weeks inpatient [facility]hab [facility]   Hypothyroidism    Thyroid disease     Surgical History: Past Surgical History:  Procedure Laterality Date   BACK SURGERY  2000   BREAST SURGERY     COLONOSCOPY     COLONOSCOPY WITH PROPOFOL N/A 01/19/2018   Procedure: COLONOSCOPY WITH PROPOFOL;  Surgeon: SLollie Sails MD;  [facility];  Service: Endoscopy;  Laterality: N/A;   parathyroidectomy      Home Medications:  Allergies as of 07/18/2021       Reactions   Tetanus Toxoid Rash        Medication List        Accurate as of July 18, 2021  9:10 AM. If you have any questions, ask your nurse or doctor.          levETIRAcetam 1000 MG tablet Commonly known as: KEPPRA Take 1 tablet  (1,000 mg total) by mouth 2 (two) times daily.   levothyroxine 100 MCG tablet Commonly known as: SYNTHROID Take 100 mcg by mouth daily.   lovastatin 10 MG tablet Commonly known as: MEVACOR Take 10 mg by mouth at bedtime.   metFORMIN 500 MG tablet Commonly known as: GLUCOPHAGE Take 1 tablet (500 mg total) by mouth 2 (two) times daily with a meal.   valproic acid 250 MG capsule Commonly known as: DEPAKENE Take 1 capsule (250 mg total) by mouth 3 (three) times daily.        Allergies:  Allergies  Allergen Reactions   Tetanus Toxoid Rash    Family History: Family History  Problem Relation Age of Onset   Diabetes Brother     Social History:   reports that she has quit smoking. She has never used smokeless tobacco. She reports that she does not drink alcohol and does not use drugs.  Physical Exam: There were no vitals taken for this visit.  Constitutional:  Alert and oriented, no acute distress, nontoxic appearing HEENT: [place], AT Cardiovascular: No clubbing, cyanosis, or edema Respiratory: Normal respiratory effort, no increased work of breathing Skin: No rashes, bruises or suspicious lesions Neurologic: Grossly intact, no focal deficits, moving all 4 extremities Psychiatric: Normal mood and affect  Laboratory Data: Results for orders placed  or performed in visit on 07/18/21  Bladder Scan (Post Void Residual) in office  Result Value Ref Range   Scan Result 857m    Catheter Removal  Patient is present today for a catheter removal.  944mof water was drained from the balloon. A 16FR foley cath was removed from the bladder no complications were noted . Patient tolerated well.  Performed by: SaDebroah LoopPA-C   Follow up/ Additional notes: Push fluids and RTC this afternoon for PVR.   Assessment & Plan:   1. Urinary retention Voiding trial failed with stable afternoon bladder volume compared to prior.  Despite this, she continues to void spontaneously,  denies abdominal discomfort, and her creatinine was WNL when she was first found to have an elevated residual with Dr. BrErlene Quan weeks ago.  Foley catheter has been incredibly bothersome for her and they have been concerned about her ability to self catheterize with her cognitive impairment following HSV encephalitis several years ago.  She remains scheduled for cystoscopy with Dr. BrErlene Quann 5 days and I encouraged her to keep this appointment.  I had a lengthy conversation with the patient and her husband in clinic today.  I explained that her urinary retention may be chronic, however the risks of this include urinary tract infection and upper tract involvement leading to renal impairment.  I offered them conservative management with return precautions, CIC teaching, and Foley catheter replacement today.  After a detailed conversation regarding the risks and benefits of conservative management, she opted for this.  We agreed to not replace her Foley catheter today.  I counseled her to contact our office immediately or proceed to the emergency department if outside office hours if she develops lower abdominal pain, abdominal distention, difficulty urinating, fever, chills, nausea, and vomiting.  She expressed understanding.  In the meantime, I counseled her to resume her normal fluid intake and start timed voiding every 2 hours during the daytime to keep her residuals as low as possible.  She expressed understanding. - Bladder Scan (Post Void Residual) in office   Return if symptoms worsen or fail to improve.  I spent 30 minutes on the day of the encounter to include pre-visit record review, face-to-face time with the patient, and post-visit ordering of tests.   SaDebroah LoopPA-C  [facility] [address] [phone]

## 2021-07-18 NOTE — Patient Instructions (Signed)
Resume your normal fluid intake. During the daytime, please urinate every two hours even if you do not feel the urge to do so. Please keep your upcoming cystoscopy appointment with Dr. Erlene Quan early next week. If you develop any of the following before then, please call our office immediately (we are open 8am-5pm Monday-Friday) or go to the Emergency Department: Fever Chills Nausea Vomiting Pain with urination Lower abdominal pain Lower abdominal swelling Difficulty urinating

## 2021-07-22 NOTE — Progress Notes (Signed)
   07/23/21  CC:  Chief Complaint  Patient presents with   Cysto     HPI: Cathy Harrington is a 75 y.o.female with a personal history of HSV encephalitis, recurrent UTI, and possible chronic urinary retention, who presents today for a cystoscopy.   She recently on 07/18/2021 failed voiding trial with a PVR of 893m with a previous PVR on 07/02/2021 of 1,169 mL. She no longer has a catheter in place.   She was unable void today. She was catheterized to obtain a urine specimen,  PVR by cath was 600 mL.   Urine today showed >30 WBC, nitrite negative, and was otherwise unremarkable. She has not been experiencing any burning today.     Vitals:   07/23/21 0937  BP: 124/85  Pulse: (!) 108  NED. A&Ox3.   No respiratory distress   Abd soft, NT, ND Normal external genitalia with patent urethral meatus  Cystoscopy Procedure Note  Patient identification was confirmed, informed consent was obtained, and patient was prepped using Betadine solution.  Lidocaine jelly was administered per urethral meatus.    Procedure: - Flexible cystoscope introduced, without any difficulty.   - Thorough search of the bladder revealed: Slightly erythematous with diffuse edema consistent with catheter and some debris.     normal urethral meatus    no stones    no ulcers     no tumors    no urethral polyps    no trabeculation  - Ureteral orifices were normal in position and appearance.  Incidentally, the bladder was noted to be fairly large capacity requiring over 500 cc to provide adequate distention for visualization  Post-Procedure: - Patient tolerated the procedure well  Assessment/ Plan:  Chronic Urinary retention - Does not have any abnormal renal function, infection, or sequela from her urinary retention thus we are managing conservatively for the time being - Preformed cystoscopy today to rule out any underlying conditions, essentially NED other than changes related to catheter cystitis -  Suggest urodynamic testing to ensure low pressure bladder and assess whether or not she has neurogenic dysfunction as suspected -We will likely also recommend upper tract imaging down the road depending on urodynamics results  2. Acute cystitis  - likely catheter related  -  Dose of Bactrim given today to treated for possible acute cystitis. Bactrim 75days prescribed.  - Urine culture; Pending    Follow-up with urodynamic results  I,Kailey Littlejohn,acting as a scribe for AHollice Espy MD.,have documented all relevant documentation on the behalf of AHollice Espy MD,as directed by  AHollice Espy MD while in the presence of AHollice Espy MD.  I have reviewed the above documentation for accuracy and completeness, and I agree with the above.   AHollice Espy MD

## 2021-07-23 ENCOUNTER — Encounter: Payer: Self-pay | Admitting: Urology

## 2021-07-23 ENCOUNTER — Other Ambulatory Visit: Payer: Self-pay

## 2021-07-23 ENCOUNTER — Ambulatory Visit (INDEPENDENT_AMBULATORY_CARE_PROVIDER_SITE_OTHER): Payer: Medicare Other | Admitting: Urology

## 2021-07-23 VITALS — BP 124/85 | HR 108 | Ht 62.0 in | Wt 162.0 lb

## 2021-07-23 DIAGNOSIS — R339 Retention of urine, unspecified: Secondary | ICD-10-CM

## 2021-07-23 DIAGNOSIS — N390 Urinary tract infection, site not specified: Secondary | ICD-10-CM

## 2021-07-23 MED ORDER — SULFAMETHOXAZOLE-TRIMETHOPRIM 800-160 MG PO TABS
1.0000 | ORAL_TABLET | Freq: Once | ORAL | Status: AC
  Administered 2021-07-23: 1 via ORAL

## 2021-07-23 MED ORDER — SULFAMETHOXAZOLE-TRIMETHOPRIM 800-160 MG PO TABS: 1.0000 | ORAL_TABLET | Freq: Two times a day (BID) | ORAL | 0 refills | Status: DC

## 2021-07-23 MED ORDER — SULFAMETHOXAZOLE-TRIMETHOPRIM 800-160 MG PO TABS: 1.0000 | ORAL_TABLET | Freq: Two times a day (BID) | ORAL | Status: DC

## 2021-07-23 NOTE — Progress Notes (Signed)
In and Out Catheterization  Patient is present today for a I & O catheterization due to UA . Patient was cleaned and prepped in a sterile fashion with betadine . A 14FR cath was inserted no complications were noted , 619ml of urine return was noted, urine was yellow in color. A clean urine sample was collected for UA. Bladder was drained  And catheter was removed with out difficulty.    Performed by: Verlene Mayer, [place]

## 2021-07-24 ENCOUNTER — Telehealth: Payer: Self-pay | Admitting: *Deleted

## 2021-07-24 ENCOUNTER — Encounter: Payer: Self-pay | Admitting: *Deleted

## 2021-07-24 LAB — MICROSCOPIC EXAMINATION: WBC, UA: >30 /hpf — AB (ref 0–5)

## 2021-07-24 LAB — URINALYSIS, COMPLETE
Bilirubin, UA: NEGATIVE
Glucose, UA: NEGATIVE
Ketones, UA: NEGATIVE
Nitrite, UA: NEGATIVE
Specific Gravity, UA: 1.015 (ref 1.005–1.030)
Urobilinogen, Ur: 0.2 mg/dL (ref 0.2–1.0)
pH, UA: 5.5 (ref 5.0–7.5)

## 2021-07-24 NOTE — Telephone Encounter (Signed)
Patient called regarding urine culture not getting sent at visit. She is aware she will call the office after she completed bactrim if she is still having dysuria.

## 2021-08-20 ENCOUNTER — Other Ambulatory Visit: Payer: Self-pay | Admitting: Urology

## 2021-08-20 NOTE — Progress Notes (Signed)
08/21/21 11:09 AM   Cathy Harrington 1946/06/28 [id number]  Referring provider:  Danae Orleans, MD [address] [place],  [place] [id number] Chief Complaint  Patient presents with   Urinary Retention      HPI: Cathy Harrington is a 75 y.o.female with a personal history of HSV encephalitis, recurrent UTI, and possible chronic urinary retention, who presents today for UDS results.   08/12/2021 UDS report revealed that she held a max capacity of approximately 1262 mls, her first sensation was felt at 1100 mls. No stress urinary incontinence was noted. There was no instability noted. She did not generate voluntary contraction and void even though she had a strong desire to do so. Trabeculation was noted. No reflux was noted. Her bladder was drained completely before she let the clinic.   She is doing well today and is accompanied by her husband.   She is experiencing no pelvic distention or discomfort.  No dysuria.  When she feels like her bladder is full, she goes to the bathroom.  She feels like her flow is good.   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)    2017 1 week [facility] and 2 weeks inpatient rehab 6 months outpatient rehab at [facility]    Hypothyroidism    Thyroid disease     Surgical History: Past Surgical History:  Procedure Laterality Date   BACK SURGERY  2000   BREAST SURGERY     COLONOSCOPY     COLONOSCOPY WITH PROPOFOL N/A 01/19/2018   Procedure: COLONOSCOPY WITH PROPOFOL;  Surgeon: Lollie Sails, MD;  Location[facility];  Service: Endoscopy;  Laterality: N/A;   parathyroidectomy      Home Medications:  Allergies as of 08/21/2021       Reactions   Tetanus Toxoid Rash        Medication List        Accurate as of August 21, 2021 11:09 AM. If you have any questions, ask your nurse or doctor.          STOP taking  these medications    sulfamethoxazole-trimethoprim 800-160 MG tablet Commonly known as: BACTRIM DS Stopped by: Hollice Espy, MD       TAKE these medications    Contour Next Test test strip Generic drug: glucose blood daily.   levETIRAcetam 1000 MG tablet Commonly known as: KEPPRA Take 1 tablet (1,000 mg total) by mouth 2 (two) times daily.   levothyroxine 100 MCG tablet Commonly known as: SYNTHROID Take 100 mcg by mouth daily.   lovastatin 10 MG tablet Commonly known as: MEVACOR Take 10 mg by mouth at bedtime.   metFORMIN 1000 MG tablet Commonly known as: GLUCOPHAGE Take 1,000 mg by mouth 2 (two) times daily.   valproic acid 250 MG capsule Commonly known as: DEPAKENE Take 1 capsule (250 mg total) by mouth 3 (three) times daily.        Allergies:  Allergies  Allergen Reactions   Tetanus Toxoid Rash    Family History: Family History  Problem Relation Age of Onset   Diabetes Brother     Social History:  reports that she has quit smoking. She has never used smokeless tobacco. She reports that she does not drink alcohol and does not use drugs.   Physical Exam: BP 138/76   Pulse 88   Ht 5\' 2"  (1.575 m)  Wt 162 lb (73.5 kg)   BMI 29.63 kg/m   Constitutional:  Alert and oriented, No acute distress.  Accompanied by her husband today who provides additional information. HEENT: [place] AT, moist mucus membranes.  Trachea midline, no masses. Cardiovascular: No clubbing, cyanosis, or edema. Respiratory: Normal respiratory effort, no increased work of breathing. Skin: No rashes, bruises or suspicious lesions. Neurologic: Grossly intact, no focal deficits, moving all 4 extremities. Psychiatric: Normal mood and affect.  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   -Continue to encourage timed and double voiding, Valsalva and Crede techniques help empty her bladder  -Warning symptoms include lower abdominal discomfort, hematuria, dysuria, or worsening renal function  - She is no emptying adequately today with PVR of 978 mL.   Follow-up in 1 year RUS (BMP if not done recently by PCP)  Altamese Mulliken as a scribe for Hollice Espy, MD.,have documented all relevant documentation on the behalf of Hollice Espy, MD,as directed by  Hollice Espy, MD while in the presence of Hollice Espy, MD.  I have reviewed the above documentation for accuracy and completeness, and I agree with the above.   Hollice Espy, MD  [facility] [address] [place], [place] [address] [phone]

## 2021-08-21 ENCOUNTER — Encounter: Payer: Self-pay | Admitting: Urology

## 2021-08-21 ENCOUNTER — Ambulatory Visit (INDEPENDENT_AMBULATORY_CARE_PROVIDER_SITE_OTHER): Payer: Medicare Other | Admitting: Urology

## 2021-08-21 ENCOUNTER — Other Ambulatory Visit: Payer: Self-pay

## 2021-08-21 VITALS — BP 138/76 | HR 88 | Ht 62.0 in | Wt 162.0 lb

## 2021-08-21 DIAGNOSIS — N390 Urinary tract infection, site not specified: Secondary | ICD-10-CM

## 2021-08-21 DIAGNOSIS — R339 Retention of urine, unspecified: Secondary | ICD-10-CM

## 2021-08-21 LAB — BLADDER SCAN AMB NON-IMAGING: Scan Result: 978

## 2021-08-30 ENCOUNTER — Other Ambulatory Visit: Payer: Self-pay

## 2021-08-30 ENCOUNTER — Ambulatory Visit (INDEPENDENT_AMBULATORY_CARE_PROVIDER_SITE_OTHER): Payer: Medicare Other | Admitting: Physician Assistant

## 2021-08-30 VITALS — BP 140/72 | HR 80 | Ht 62.0 in | Wt 160.0 lb

## 2021-08-30 DIAGNOSIS — R399 Unspecified symptoms and signs involving the genitourinary system: Secondary | ICD-10-CM

## 2021-08-30 LAB — URINALYSIS, COMPLETE
Bilirubin, UA: NEGATIVE
Nitrite, UA: NEGATIVE
Specific Gravity, UA: 1.015 (ref 1.005–1.030)
Urobilinogen, Ur: 0.2 mg/dL (ref 0.2–1.0)
pH, UA: 5.5 (ref 5.0–7.5)

## 2021-08-30 LAB — MICROSCOPIC EXAMINATION: WBC, UA: >30 /hpf — AB (ref 0–5)

## 2021-08-30 LAB — BLADDER SCAN AMB NON-IMAGING: Scan Result: 691

## 2021-08-30 MED ORDER — SULFAMETHOXAZOLE-TRIMETHOPRIM 800-160 MG PO TABS: 1.0000 | ORAL_TABLET | Freq: Two times a day (BID) | ORAL | 0 refills | Status: AC

## 2021-08-30 NOTE — Progress Notes (Signed)
08/30/2021 3:22 PM   Cathy Harrington 06-29-1946 [id number]  CC: Chief Complaint  Patient presents with   Recurrent UTI   HPI: Cathy Harrington is a 75 y.o. female with PMH HSV encephalitis, recurrent UTI, chronic urinary retention with no evidence of reflux managed conservatively who presents today for evaluation of possible UTI.   Today she reports a several weeks long history of discolored urine after undergoing urodynamics as well as increased nocturia.  She is also had some dysuria.  She denies fever, chills, nausea, or vomiting.  In-office UA today positive for 1+ glucose, trace ketones, trace intact blood, 2+ protein, and 1+ leukocyte esterase; urine microscopy with >30 WBCs/HPF, granular casts, and many bacteria. PVR 639mL.  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)    2017 1 week [facility] and 2 weeks inpatient rehab 6 months outpatient rehab at [facility]    Hypothyroidism    Thyroid disease     Surgical History: Past Surgical History:  Procedure Laterality Date   BACK SURGERY  2000   BREAST SURGERY     COLONOSCOPY     COLONOSCOPY WITH PROPOFOL N/A 01/19/2018   Procedure: COLONOSCOPY WITH PROPOFOL;  Surgeon: Lollie Sails, MD;  Location: [facility];  Service: Endoscopy;  Laterality: N/A;   parathyroidectomy      Home Medications:  Allergies as of 08/30/2021       Reactions   Tetanus Toxoid Rash        Medication List        Accurate as of August 30, 2021  3:22 PM. If you have any questions, ask your nurse or doctor.          Contour Next Test test strip Generic drug: glucose blood daily.   levETIRAcetam 1000 MG tablet Commonly known as: KEPPRA Take 1 tablet (1,000 mg total) by mouth 2 (two) times daily.   levothyroxine 100 MCG tablet Commonly known as: SYNTHROID Take 100 mcg by mouth daily.   lovastatin 10 MG tablet Commonly known as:  MEVACOR Take 10 mg by mouth at bedtime.   metFORMIN 1000 MG tablet Commonly known as: GLUCOPHAGE Take 1,000 mg by mouth 2 (two) times daily.   sulfamethoxazole-trimethoprim 800-160 MG tablet Commonly known as: BACTRIM DS Take 1 tablet by mouth 2 (two) times daily for 7 days. Started by: Debroah Loop, PA-C   valproic acid 250 MG capsule Commonly known as: DEPAKENE Take 1 capsule (250 mg total) by mouth 3 (three) times daily.        Allergies:  Allergies  Allergen Reactions   Tetanus Toxoid Rash    Family History: Family History  Problem Relation Age of Onset   Diabetes Brother     Social History:   reports that she has quit smoking. She has never used smokeless tobacco. She reports that she does not drink alcohol and does not use drugs.  Physical Exam: BP 140/72   Pulse 80   Ht 5\' 2"  (1.575 m)   Wt 160 lb (72.6 kg)   BMI 29.26 kg/m   Constitutional:  Alert and oriented, no acute distress, nontoxic appearing HEENT: [place], AT Cardiovascular: No clubbing, cyanosis, or edema Respiratory: Normal respiratory effort, no increased work of breathing Skin: No rashes, bruises or suspicious lesions Neurologic: Grossly intact, no focal deficits, moving all 4 extremities Psychiatric: Normal mood and affect  Laboratory Data: Results for orders placed or  performed in visit on 08/30/21  CULTURE, URINE COMPREHENSIVE   Specimen: Urine   UR  Result Value Ref Range   Urine Culture, Comprehensive Preliminary report    Organism ID, Bacteria Comment    Organism ID, Bacteria Comment   Microscopic Examination   Urine  Result Value Ref Range   WBC, UA >30 (A) 0 - 5 /hpf   RBC 0-2 0 - 2 /hpf   Epithelial Cells (non renal) 0-10 0 - 10 /hpf   Casts Present (A) None seen /lpf   Cast Type Granular casts (A) N/A   Bacteria, UA Many (A) None seen/Few  Urinalysis, Complete  Result Value Ref Range   Specific Gravity, UA 1.015 1.005 - 1.030   pH, UA 5.5 5.0 - 7.5   Color, UA  Yellow Yellow   Appearance Ur Cloudy (A) Clear   Leukocytes,UA 1+ (A) Negative   Protein,UA 2+ (A) Negative/Trace   Glucose, UA 1+ (A) Negative   Ketones, UA Trace (A) Negative   RBC, UA Trace (A) Negative   Bilirubin, UA Negative Negative   Urobilinogen, Ur 0.2 0.2 - 1.0 mg/dL   Nitrite, UA Negative Negative   Microscopic Examination See below:   Bladder Scan (Post Void Residual) in office  Result Value Ref Range   Scan Result 691    Assessment & Plan:   1. UTI symptoms UA today notable for pyuria and bacteriuria.  Will start empiric Bactrim and send for culture for further evaluation.  Notably, her residual is relatively low compared to prior.  If her nocturia persists despite treatment or she develops more recurrent UTI, these would be indications for more aggressive management of her chronic retention. - Urinalysis, Complete - Bladder Scan (Post Void Residual) in office - CULTURE, URINE COMPREHENSIVE - sulfamethoxazole-trimethoprim (BACTRIM DS) 800-160 MG tablet; Take 1 tablet by mouth 2 (two) times daily for 7 days.  Dispense: 14 tablet; Refill: 0  Return if symptoms worsen or fail to improve.  Debroah Loop, PA-C  [facility] [address], [place], [place] [address] [phone]

## 2021-09-02 LAB — CULTURE, URINE COMPREHENSIVE

## 2021-09-03 ENCOUNTER — Telehealth: Payer: Self-pay

## 2021-09-03 DIAGNOSIS — R399 Unspecified symptoms and signs involving the genitourinary system: Secondary | ICD-10-CM

## 2021-09-03 MED ORDER — CEFUROXIME AXETIL 250 MG PO TABS: 250.0000 mg | ORAL_TABLET | Freq: Two times a day (BID) | ORAL | 0 refills | Status: AC

## 2021-09-03 NOTE — Telephone Encounter (Signed)
Abx sent, patient notified and expressed understanding.

## 2021-09-03 NOTE — Telephone Encounter (Signed)
-----   Message from Debroah Loop, [place] sent at 09/03/2021  8:49 AM EDT ----- Based on the results of her culture, I'd like to switch her antibiotic from Bactrim to cefuroxime 250mg  BID x7 days. ----- Message ----- From: Lavone Neri Lab Results In Sent: 08/30/2021   4:36 PM EDT To: Debroah Loop, PA-C

## 2022-08-25 ENCOUNTER — Other Ambulatory Visit: Payer: Self-pay

## 2022-08-25 DIAGNOSIS — E119 Type 2 diabetes mellitus without complications: Secondary | ICD-10-CM

## 2022-08-25 DIAGNOSIS — D49519 Neoplasm of unspecified behavior of unspecified kidney: Secondary | ICD-10-CM

## 2022-08-25 DIAGNOSIS — R339 Retention of urine, unspecified: Secondary | ICD-10-CM

## 2022-08-25 DIAGNOSIS — E1169 Type 2 diabetes mellitus with other specified complication: Secondary | ICD-10-CM

## 2022-08-26 ENCOUNTER — Encounter: Payer: Self-pay | Admitting: Urology

## 2022-08-26 ENCOUNTER — Ambulatory Visit (INDEPENDENT_AMBULATORY_CARE_PROVIDER_SITE_OTHER): Payer: Medicare Other | Admitting: Urology

## 2022-08-26 VITALS — BP 138/85 | HR 102 | Wt 157.2 lb

## 2022-08-26 DIAGNOSIS — Z87440 Personal history of urinary (tract) infections: Secondary | ICD-10-CM

## 2022-08-26 DIAGNOSIS — R339 Retention of urine, unspecified: Secondary | ICD-10-CM

## 2022-08-26 DIAGNOSIS — R338 Other retention of urine: Secondary | ICD-10-CM

## 2022-08-26 DIAGNOSIS — N390 Urinary tract infection, site not specified: Secondary | ICD-10-CM

## 2022-08-26 LAB — BLADDER SCAN AMB NON-IMAGING

## 2022-08-26 NOTE — Patient Instructions (Signed)
Please call scheduling and schedule renal ultrasound [phone]

## 2022-08-26 NOTE — Progress Notes (Signed)
08/26/2022 12:00 PM   Cathy Harrington February 28, 1946 [id number]  Referring provider: Danae Orleans, MD No address on file  No chief complaint on file.   HPI: 76 year old female who returns today for follow-up for chronic urinary retention managed conservatively.  She previously underwent urodynamics which showed impaired sensation, low bladder pressures and atonic bladder.  She had no reflux and a fairly large bladder volume.  She reports that she had 1 single infection shortly after urodynamics last year but otherwise has had no further infections.  Her urinary symptoms are at her baseline, does have nocturia x2-3 as well as some occasional daytime frequency but without incontinence.  She continues to desire no intervention for her chronic retention.  BMP today is pending.  She did not get a renal ultrasound, was instructed to schedule.  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)    2017 1 week [facility] and 2 weeks inpatient rehab [facility]   Hypothyroidism    Thyroid disease     Surgical History: Past Surgical History:  Procedure Laterality Date   BACK SURGERY  2000   BREAST SURGERY     COLONOSCOPY     COLONOSCOPY WITH PROPOFOL N/A 01/19/2018   Procedure: COLONOSCOPY WITH PROPOFOL;  Surgeon: SLollie Sails MD;  [facility];  Service: Endoscopy;  Laterality: N/A;   parathyroidectomy      Home Medications:  Allergies as of 08/26/2022       Reactions   Tetanus Toxoid Rash        Medication List        Accurate as of August 26, 2022 12:00 PM. If you have any questions, ask your nurse or doctor.          Contour Next Test test strip Generic drug: glucose  blood daily.   Januvia 100 MG tablet Generic drug: sitaGLIPtin Take 100 mg by mouth daily.   levETIRAcetam 1000 MG tablet Commonly known as: KEPPRA Take 1 tablet (1,000 mg total) by mouth 2 (two) times daily.   levothyroxine 100 MCG tablet Commonly known as: SYNTHROID Take 100 mcg by mouth daily.   lisinopril 2.5 MG tablet Commonly known as: ZESTRIL Take 1 tablet by mouth daily.   lovastatin 10 MG tablet Commonly known as: MEVACOR Take 10 mg by mouth at bedtime.   metFORMIN 1000 MG tablet Commonly known as: GLUCOPHAGE Take 1,000 mg by mouth 2 (two) times daily.   valproic acid 250 MG capsule Commonly known as: DEPAKENE Take 1 capsule (250 mg total) by mouth 3 (three) times daily.        Allergies:  Allergies  Allergen Reactions   Tetanus Toxoid Rash    Family History: Family History  Problem Relation Age of Onset   Diabetes Brother     Social History:  reports that she has quit smoking. She has never used smokeless tobacco. She reports that she does not drink alcohol and does not use drugs.   Physical Exam: BP 138/85   Pulse (!) 102   Wt 157 lb 3.2 oz (71.3 kg)   BMI 28.75 kg/m   Constitutional:  Alert and oriented, No acute distress. HEENT: [place] AT,  moist mucus membranes.  Trachea midline, no masses. Cardiovascular: No clubbing, cyanosis, or edema. Respiratory: Normal respiratory effort, no increased work of breathing. Neurologic: Grossly intact, no focal deficits, moving all 4 extremities. Psychiatric: Normal mood and affect.  Laboratory Data: Lab Results  Component Value Date   WBC 5.6 05/18/2019   HGB 13.5 05/18/2019   HCT 42.8 05/18/2019   MCV 89.4 05/18/2019   PLT 212 05/18/2019    Lab Results  Component Value Date   CREATININE 0.75 07/02/2021    Assessment & Plan:    1.  Neurogenic bladder/chronic urinary retention Previous urodynamics indicate low bladder pressures she has not had any further issues with infection  BMP today as  well as schedule renal ultrasound to evaluate upper tracts  Continue to crede and timed void  Discussed again the option for CIC to address #2, declined and prefers conservative management which seems reasonable at this time given the lack of sequela  2.  Urinary frequency As above  Return in about 1 year (around 08/27/2023) for BMP (day of is ok)/ PVR.  Hollice Espy, MD  [facility] [address] [place], [place] [address] [phone]

## 2022-08-27 LAB — BASIC METABOLIC PANEL
BUN/Creatinine Ratio: 19 (ref 12–28)
BUN: 17 mg/dL (ref 8–27)
CO2: 25 mmol/L (ref 20–29)
Calcium: 10 mg/dL (ref 8.7–10.3)
Chloride: 99 mmol/L (ref 96–106)
Creatinine, Ser: 0.89 mg/dL (ref 0.57–1.00)
Glucose: 127 mg/dL — ABNORMAL HIGH (ref 70–99)
Potassium: 5 mmol/L (ref 3.5–5.2)
Sodium: 140 mmol/L (ref 134–144)
eGFR: 68 mL/min/{1.73_m2} (ref >59)

## 2022-09-05 ENCOUNTER — Ambulatory Visit
Admission: RE | Admit: 2022-09-05 | Discharge: 2022-09-05 | Disposition: A | Discharge status: Home / Self Care | Payer: Medicare Other | Source: Ambulatory Visit | Attending: Urology | Admitting: Urology

## 2022-09-05 DIAGNOSIS — E119 Type 2 diabetes mellitus without complications: Secondary | ICD-10-CM | POA: Insufficient documentation

## 2022-09-05 DIAGNOSIS — D49519 Neoplasm of unspecified behavior of unspecified kidney: Secondary | ICD-10-CM | POA: Diagnosis present

## 2022-09-05 DIAGNOSIS — R339 Retention of urine, unspecified: Secondary | ICD-10-CM | POA: Insufficient documentation

## 2022-09-05 DIAGNOSIS — E1169 Type 2 diabetes mellitus with other specified complication: Secondary | ICD-10-CM | POA: Diagnosis present

## 2022-09-05 DIAGNOSIS — E669 Obesity, unspecified: Secondary | ICD-10-CM | POA: Insufficient documentation

## 2022-09-15 NOTE — Progress Notes (Unsigned)
09/16/2022 9:26 AM   Cathy Harrington May 15, 1946 [id number]  Referring provider: Danae Orleans, MD No address on file  Urological history: 1. Neurogenic bladder/chronic urinary retention -cysto (07/2021) NED -UDS (08/2021) spontaneous bladder contractions, but she has no evidence of reflux and otherwise low bladder pressure with good compliance  -Serum creatinine (08/2022) 0.89 -PVR 560 mL   2. rUTI's -Contributing factors of age, vaginal atrophy, diabetes and chronic urinary retention -Documented urine cultures over the last year  None  3. Renal cyst -RUS (09/2022) 1. 5 cm simple left renal cyst  Chief Complaint  Patient presents with   Urinary Tract Infection    HPI: Cathy Harrington is a 76 y.o. female who presents today for frequency and burning.   She is stating that she has noticed burning with urination and discolored urine for the last 3 weeks.  Patient denies any modifying or aggravating factors.  Patient denies any gross hematuria or suprapubic/flank pain.  Patient denies any fevers, chills, nausea or vomiting.    UA yellow cloudy, i1.015,1+ blood, pH 5.0, 2+ protein, 2+ leukocytes, greater than 30 WBCs, 0-2 RBCs, 0-10 epithelial cells and many bacteria.  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)    2017 1 week [facility] and 2 weeks inpatient rehab 6 months outpatient rehab at [facility]    Hypothyroidism    Thyroid disease     Surgical History: Past Surgical History:  Procedure Laterality Date   BACK SURGERY  2000   BREAST SURGERY     COLONOSCOPY     COLONOSCOPY WITH PROPOFOL N/A 01/19/2018   Procedure: COLONOSCOPY WITH PROPOFOL;  Surgeon: Lollie Sails, MD;  Location: [facility] ENDOSCOPY;  Service: Endoscopy;  Laterality: N/A;   parathyroidectomy      Home Medications:  Allergies as of 09/16/2022       Reactions   Tetanus Toxoid Rash         Medication List        Accurate as of September 16, 2022  9:26 AM. If you have any questions, ask your nurse or doctor.          Contour Next Test test strip Generic drug: glucose blood daily.   Januvia 100 MG tablet Generic drug: sitaGLIPtin Take 100 mg by mouth daily.   levETIRAcetam 1000 MG tablet Commonly known as: KEPPRA Take 1 tablet (1,000 mg total) by mouth 2 (two) times daily.   levothyroxine 100 MCG tablet Commonly known as: SYNTHROID Take 100 mcg by mouth daily.   lisinopril 2.5 MG tablet Commonly known as: ZESTRIL Take 1 tablet by mouth daily.   lovastatin 10 MG tablet Commonly known as: MEVACOR Take 10 mg by mouth at bedtime.   metFORMIN 1000 MG tablet Commonly known as: GLUCOPHAGE Take 1,000 mg by mouth 2 (two) times daily.   nitrofurantoin (macrocrystal-monohydrate) 100 MG capsule Commonly known as: Macrobid Take 1 capsule (100 mg total) by mouth 2 (two) times daily for 7 days.   valproic acid 250 MG capsule Commonly known as: DEPAKENE Take 1 capsule (250 mg total) by mouth 3 (three) times daily.        Allergies:  Allergies  Allergen Reactions   Tetanus Toxoid Rash    Family History: Family History  Problem Relation Age of Onset   Diabetes Brother     Social History:  reports that she  has quit smoking. She has never used smokeless tobacco. She reports that she does not drink alcohol and does not use drugs.  ROS: Pertinent ROS in HPI  Physical Exam: BP 129/83   Pulse (!) 103   Ht '5\' 2"'$  (1.575 m)   Wt 156 lb (70.8 kg)   BMI 28.53 kg/m   Constitutional:  Well nourished. Alert and oriented, No acute distress. HEENT: [place] AT, moist mucus membranes.  Trachea midline Cardiovascular: No clubbing, cyanosis, or edema. Respiratory: Normal respiratory effort, no increased work of breathing. GU: No CVA tenderness.  No bladder fullness or masses. Vulvovaginal atrophy w/ pallor, loss of rugae and introital retraction.  Anus and  perineum are without rashes or lesions.     Neurologic: Grossly intact, no focal deficits, moving all 4 extremities. Psychiatric: Normal mood and affect.    Laboratory Data: Lab Results  Component Value Date   CREATININE 0.89 08/26/2022   Urinalysis See EPIC and HPI I have reviewed the labs.   Pertinent Imaging: N/A   In and Out Catheterization  Patient is present today for a I & O catheterization due to dysuria and frequency. Patient was cleaned and prepped in a sterile fashion with betadine . A 14 FR cath was inserted no complications were noted , 560 ml of urine return was noted, urine was yellow in color. A clean urine sample was collected for urinalysis and urine culture. Bladder was drained  And catheter was removed with out difficulty.    Performed by: Zara Council, PA-C and Evelina Bucy, CMA   Assessment & Plan:    1. Suspected UTI -UA with pyuria and bacteriuria -urine sent for culture  -Start Macrobid 100 mg twice daily for 14 days, we will adjust if necessary once urine culture and sensitivities are available  2. Neurogenic bladder/chronic urinary retention -explained to to her that the nocturia is a result of her incomplete bladder emptying and there is no other treatment for it  -I offered to her instruction on self-catheterization technique as this will help her empty her bladder completely and decrease the amount of time she has to get up during the night to urinate, but she deferred -Did briefly discuss placement of suprapubic tube in the future if she has issues with recurrent urinary tract infections or there is renal compromise, but we would like to defer this as long as possible  Return for pending urine cutlure results .  These notes generated with voice recognition software. I apologize for typographical errors.  [place], [place] [address]  [place], [place] [address] [phone]

## 2022-09-16 ENCOUNTER — Ambulatory Visit (INDEPENDENT_AMBULATORY_CARE_PROVIDER_SITE_OTHER): Payer: Medicare Other | Admitting: Urology

## 2022-09-16 ENCOUNTER — Encounter: Payer: Self-pay | Admitting: Urology

## 2022-09-16 VITALS — BP 129/83 | HR 103 | Ht 62.0 in | Wt 156.0 lb

## 2022-09-16 DIAGNOSIS — R300 Dysuria: Secondary | ICD-10-CM

## 2022-09-16 DIAGNOSIS — R350 Frequency of micturition: Secondary | ICD-10-CM | POA: Diagnosis not present

## 2022-09-16 DIAGNOSIS — R3989 Other symptoms and signs involving the genitourinary system: Secondary | ICD-10-CM

## 2022-09-16 DIAGNOSIS — R339 Retention of urine, unspecified: Secondary | ICD-10-CM

## 2022-09-16 DIAGNOSIS — R8271 Bacteriuria: Secondary | ICD-10-CM

## 2022-09-16 DIAGNOSIS — Z87440 Personal history of urinary (tract) infections: Secondary | ICD-10-CM

## 2022-09-16 DIAGNOSIS — N319 Neuromuscular dysfunction of bladder, unspecified: Secondary | ICD-10-CM | POA: Diagnosis not present

## 2022-09-16 DIAGNOSIS — R8281 Pyuria: Secondary | ICD-10-CM

## 2022-09-16 LAB — URINALYSIS, COMPLETE
Bilirubin, UA: NEGATIVE
Glucose, UA: NEGATIVE
Ketones, UA: NEGATIVE
Nitrite, UA: NEGATIVE
Specific Gravity, UA: 1.015 (ref 1.005–1.030)
Urobilinogen, Ur: 0.2 mg/dL (ref 0.2–1.0)
pH, UA: 5 (ref 5.0–7.5)

## 2022-09-16 LAB — MICROSCOPIC EXAMINATION: WBC, UA: >30 /hpf — AB (ref 0–5)

## 2022-09-16 MED ORDER — NITROFURANTOIN MONOHYD MACRO 100 MG PO CAPS: 100.0000 mg | ORAL_CAPSULE | Freq: Two times a day (BID) | ORAL | 0 refills | Status: AC

## 2022-09-19 ENCOUNTER — Telehealth: Payer: Self-pay

## 2022-09-19 LAB — CULTURE, URINE COMPREHENSIVE

## 2022-09-19 NOTE — Telephone Encounter (Signed)
-----   Message from Nori Riis, PA-C sent at 09/19/2022  9:53 AM EST ----- Please let Mrs. Rufo know that her urine culture was positive for infection and that the Macrobid is the correct antibiotic.  I would like to see her back in 1 month.

## 2022-09-19 NOTE — Telephone Encounter (Signed)
Notified pt as advised. 1 month f/u appt made, pt confirmed.

## 2022-10-20 NOTE — Progress Notes (Unsigned)
10/21/2022 8:51 PM   Cathy Harrington 08-30-1946 [id number]  Referring provider: Danae Orleans, MD No address on file  Urological history: 1. Neurogenic bladder/chronic urinary retention -cysto (07/2021) NED -UDS (08/2021) spontaneous bladder contractions, but she has no evidence of reflux and otherwise low bladder pressure with good compliance  -Serum creatinine (08/2022) 0.89 -PVR 560 mL   2. rUTI's -Contributing factors of age, vaginal atrophy, diabetes and chronic urinary retention -Documented urine cultures over the last year  None  3. Renal cyst -RUS (09/2022) 1. 5 cm simple left renal cyst  No chief complaint on file.   HPI: Cathy Harrington is a 76 y.o. female who presents today for one month follow up.   At her visit on 09/16/2022, she stated that she has noticed burning with urination and discolored urine for the last 3 weeks.  UA yellow cloudy, SG 1.015,1+ blood, pH 5.0, 2+ protein, 2+ leukocytes, greater than 30 WBCs, 0-2 RBCs, 0-10 epithelial cells and many bacteria.  PVR 560 mL.  Urine culture positive for E. Coli.  She was treated with culture appropriate antibiotics.  We also discussed that her incomplete bladder emptying was contributing to her nocturia and offered CIC, but she deferred.     PMH: Past Medical History:  Diagnosis Date   Diabetes mellitus without complication (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)    2017 1 week [facility] and 2 weeks inpatient rehab 6 months outpatient rehab at [facility]    Hypothyroidism    Thyroid disease     Surgical History: Past Surgical History:  Procedure Laterality Date   BACK SURGERY  2000   BREAST SURGERY     COLONOSCOPY     COLONOSCOPY WITH PROPOFOL N/A 01/19/2018   Procedure: COLONOSCOPY WITH PROPOFOL;  Surgeon: Lollie Sails, MD;  [facility];  Service: Endoscopy;  Laterality: N/A;   parathyroidectomy      Home Medications:  Allergies as of 10/21/2022       Reactions    Tetanus Toxoid Rash        Medication List        Accurate as of October 20, 2022  8:51 PM. If you have any questions, ask your nurse or doctor.          Contour Next Test test strip Generic drug: glucose blood daily.   Januvia 100 MG tablet Generic drug: sitaGLIPtin Take 100 mg by mouth daily.   levETIRAcetam 1000 MG tablet Commonly known as: KEPPRA Take 1 tablet (1,000 mg total) by mouth 2 (two) times daily.   levothyroxine 100 MCG tablet Commonly known as: SYNTHROID Take 100 mcg by mouth daily.   lisinopril 2.5 MG tablet Commonly known as: ZESTRIL Take 1 tablet by mouth daily.   lovastatin 10 MG tablet Commonly known as: MEVACOR Take 10 mg by mouth at bedtime.   metFORMIN 1000 MG tablet Commonly known as: GLUCOPHAGE Take 1,000 mg by mouth 2 (two) times daily.   valproic acid 250 MG capsule Commonly known as: DEPAKENE Take 1 capsule (250 mg total) by mouth 3 (three) times daily.        Allergies:  Allergies  Allergen Reactions   Tetanus Toxoid Rash    Family History: Family History  Problem Relation Age of Onset   Diabetes Brother     Social History:  reports that she has quit smoking. She has never used smokeless tobacco. She reports that she does not drink alcohol and does  not use drugs.  ROS: Pertinent ROS in HPI  Physical Exam: There were no vitals taken for this visit.  Constitutional:  Well nourished. Alert and oriented, No acute distress. HEENT: [place] AT, moist mucus membranes.  Trachea midline, no masses. Cardiovascular: No clubbing, cyanosis, or edema. Respiratory: Normal respiratory effort, no increased work of breathing. GU: No CVA tenderness.  No bladder fullness or masses. Vulvovaginal atrophy w/ pallor, loss of rugae, introital retraction, excoriations.  Vulvar thinning, fusion of labia, clitoral hood retraction, prominent urethral meatus.   *** external genitalia, *** pubic hair distribution, no lesions.  Normal urethral  meatus, no lesions, no prolapse, no discharge.   No urethral masses, tenderness and/or tenderness. No bladder fullness, tenderness or masses. *** vagina mucosa, *** estrogen effect, no discharge, no lesions, *** pelvic support, *** cystocele and *** rectocele noted.  No cervical motion tenderness.  Uterus is freely mobile and non-fixed.  No adnexal/parametria masses or tenderness noted.  Anus and perineum are without rashes or lesions.   ***  Neurologic: Grossly intact, no focal deficits, moving all 4 extremities. Psychiatric: Normal mood and affect.    Laboratory Data: N/A  Pertinent Imaging: ***    Assessment & Plan:    1. E. Coli UTI -  2. Neurogenic bladder/chronic urinary retention -***  No follow-ups on file.  These notes generated with voice recognition software. I apologize for typographical errors.  [place], [place] [address]  [place], [place] [address] [phone]

## 2022-10-21 ENCOUNTER — Ambulatory Visit (INDEPENDENT_AMBULATORY_CARE_PROVIDER_SITE_OTHER): Payer: Medicare Other | Admitting: Urology

## 2022-10-21 ENCOUNTER — Encounter: Payer: Self-pay | Admitting: Urology

## 2022-10-21 VITALS — BP 148/70 | HR 74 | Ht 62.0 in | Wt 157.0 lb

## 2022-10-21 DIAGNOSIS — R339 Retention of urine, unspecified: Secondary | ICD-10-CM | POA: Diagnosis not present

## 2022-10-21 DIAGNOSIS — N319 Neuromuscular dysfunction of bladder, unspecified: Secondary | ICD-10-CM | POA: Diagnosis not present

## 2022-10-21 DIAGNOSIS — R3989 Other symptoms and signs involving the genitourinary system: Secondary | ICD-10-CM

## 2022-10-21 LAB — BLADDER SCAN AMB NON-IMAGING: Scan Result: 997

## 2022-10-22 ENCOUNTER — Telehealth: Payer: Self-pay | Admitting: Family Medicine

## 2022-10-22 LAB — BASIC METABOLIC PANEL
BUN/Creatinine Ratio: 20 (ref 12–28)
BUN: 16 mg/dL (ref 8–27)
CO2: 25 mmol/L (ref 20–29)
Calcium: 10.2 mg/dL (ref 8.7–10.3)
Chloride: 99 mmol/L (ref 96–106)
Creatinine, Ser: 0.82 mg/dL (ref 0.57–1.00)
Glucose: 167 mg/dL — ABNORMAL HIGH (ref 70–99)
Potassium: 4.9 mmol/L (ref 3.5–5.2)
Sodium: 140 mmol/L (ref 134–144)
eGFR: 74 mL/min/{1.73_m2} (ref >59)

## 2022-10-22 NOTE — Telephone Encounter (Signed)
-----   Message from Nori Riis, PA-C sent at 10/22/2022  6:21 AM EST ----- Please let Mrs. Lezama know that her kidney function is normal and we will see her in March.

## 2022-10-22 NOTE — Telephone Encounter (Signed)
Patient notified and voiced understanding.

## 2022-12-25 ENCOUNTER — Other Ambulatory Visit: Payer: Self-pay

## 2022-12-25 ENCOUNTER — Emergency Department
Admission: EM | Admit: 2022-12-25 | Discharge: 2022-12-25 | Disposition: A | Discharge status: Home / Self Care | Payer: Medicare Other | Attending: Emergency Medicine | Admitting: Emergency Medicine

## 2022-12-25 DIAGNOSIS — R339 Retention of urine, unspecified: Secondary | ICD-10-CM

## 2022-12-25 DIAGNOSIS — R319 Hematuria, unspecified: Secondary | ICD-10-CM | POA: Diagnosis not present

## 2022-12-25 DIAGNOSIS — N390 Urinary tract infection, site not specified: Secondary | ICD-10-CM

## 2022-12-25 LAB — URINALYSIS, ROUTINE W REFLEX MICROSCOPIC
Bacteria, UA: NONE SEEN
Bilirubin Urine: NEGATIVE
Glucose, UA: NEGATIVE mg/dL
Ketones, ur: NEGATIVE mg/dL
Leukocytes,Ua: NEGATIVE
Nitrite: NEGATIVE
Protein, ur: 100 mg/dL — AB
RBC / HPF: >50 RBC/hpf (ref 0–5)
Specific Gravity, Urine: 1.009 (ref 1.005–1.030)
Squamous Epithelial / HPF: NONE SEEN /HPF (ref 0–5)
WBC, UA: >50 WBC/hpf (ref 0–5)
pH: 6 (ref 5.0–8.0)

## 2022-12-25 LAB — CBC WITH DIFFERENTIAL/PLATELET
Abs Immature Granulocytes: 0.04 10*3/uL (ref 0.00–0.07)
Basophils Absolute: 0 10*3/uL (ref 0.0–0.1)
Basophils Relative: 1 %
Eosinophils Absolute: 0.1 10*3/uL (ref 0.0–0.5)
Eosinophils Relative: 1 %
HCT: 41 % (ref 36.0–46.0)
Hemoglobin: 13.2 g/dL (ref 12.0–15.0)
Immature Granulocytes: 1 %
Lymphocytes Relative: 30 %
Lymphs Abs: 2.2 10*3/uL (ref 0.7–4.0)
MCH: 28.8 pg (ref 26.0–34.0)
MCHC: 32.2 g/dL (ref 30.0–36.0)
MCV: 89.3 fL (ref 80.0–100.0)
Monocytes Absolute: 1.3 10*3/uL — ABNORMAL HIGH (ref 0.1–1.0)
Monocytes Relative: 17 %
Neutro Abs: 3.8 10*3/uL (ref 1.7–7.7)
Neutrophils Relative %: 50 %
Platelets: 244 10*3/uL (ref 150–400)
RBC: 4.59 MIL/uL (ref 3.87–5.11)
RDW: 14.1 % (ref 11.5–15.5)
WBC: 7.4 10*3/uL (ref 4.0–10.5)
nRBC: 0 % (ref 0.0–0.2)

## 2022-12-25 LAB — BASIC METABOLIC PANEL
Anion gap: 10 (ref 5–15)
BUN: 21 mg/dL (ref 8–23)
CO2: 26 mmol/L (ref 22–32)
Calcium: 9.6 mg/dL (ref 8.9–10.3)
Chloride: 101 mmol/L (ref 98–111)
Creatinine, Ser: 0.85 mg/dL (ref 0.44–1.00)
GFR, Estimated: >60 mL/min (ref >60)
Glucose, Bld: 159 mg/dL — ABNORMAL HIGH (ref 70–99)
Potassium: 4.5 mmol/L (ref 3.5–5.1)
Sodium: 137 mmol/L (ref 135–145)

## 2022-12-25 MED ORDER — CEPHALEXIN 500 MG PO CAPS: 500.0000 mg | ORAL_CAPSULE | Freq: Two times a day (BID) | ORAL | 0 refills | Status: DC

## 2022-12-25 MED ORDER — CEPHALEXIN 500 MG PO CAPS: 500.0000 mg | ORAL_CAPSULE | Freq: Once | ORAL | Status: DC

## 2022-12-25 NOTE — Discharge Instructions (Addendum)
Call your urologist for a follow-up appointment this week for urinary retention and to check on your Foley catheter.    Thank you for choosing [place] for your health care today!  Please see your primary doctor this week for a follow up appointment.   Sometimes, in the early stages of certain disease courses it is difficult to detect in the emergency department evaluation -- so, it is important that you continue to monitor your symptoms and call your doctor right away or return to the emergency department if you develop any new or worsening symptoms.  Please go to the following website to schedule new (and existing) patient appointments:   [email or web address]  If you do not have a primary doctor try calling the following clinics to establish care:  If you have insurance:  [facility] [phone] [place] [address]   [facility]  [phone] [place] [place] [address]   If you do not have insurance:  [facility]  [phone] [address] [place] [address]   The following is another list of primary care offices in the area who are accepting new patients at this time.  Please reach out to one of them directly and let them know you would like to schedule an appointment to follow up on an Emergency Department visit, and/or to establish a new primary care provider (PCP).  There are likely other primary care clinics in the are who are accepting new patients, but this is an excellent place to start:  [place] physician: Dr Lavon Paganini [address] [place], [place] [address] [phone]  [facility] Lead Physician: Dr Steele Sizer [address], [place], [place] [address] [phone]  [place] Physician: Dr Park Liter [address] [place], [place] [address] [phone]  [facility] Lead Physician: Dr Dewaine Oats [place], [place], [place] [address] [phone]  [place] [facility] [place] Physician: Dr Halina Maidens [address] [place], [place] [address] [phone]   It was my pleasure to care for you today.   [place] Jacelyn Grip, MD

## 2022-12-25 NOTE — ED Notes (Signed)
Pt attempted to urinate, unable to do so, more water provided, Jacelyn Grip, MD notified

## 2022-12-25 NOTE — ED Provider Notes (Signed)
-----------------------------------------   8:04 AM on 12/25/2022 ----------------------------------------- Patient care assumed from Dr. Jacelyn Grip.  A catheter has been placed for urinary retention greater than 700 cc unable to void.  Urinalysis does show greater than 50 red and white cells but no bacteria.  We will cover with oral antibiotics and send a urine culture as precaution.  Remainder the workup is largely nonrevealing with a normal chemistry normal CBC.  Patient will be discharged home with PCP follow-up.   Harvest Dark, MD 12/25/22 [phone]

## 2022-12-25 NOTE — ED Provider Notes (Addendum)
[facility] Provider Note    Event Date/Time   First MD Initiated Contact with Patient 12/25/22 [phone]     (approximate)   History   Hematuria (States that she has been having large amount blood in her urine since 12 midnight. Denies abdominal pain. Urology suggest that self cath but states that she is hesitant to do it herself. Has been having increased difficulty urinating x 3 weeks. Next urology appt next month)   HPI  Cathy Harrington is a 77 y.o. female   Past medical history of neurogenic bladder frequent UTIs and urinary retention who self catheterizes at home who presents to the emergency department with hematuria and urinary retention, reports more more difficulty with urination over the past 3 weeks increasing.  Hematuria started last night, denies pain.  Denies systemic signs of infection like his or chills.  Denies any other acute medical complaints   External Medical Documents Reviewed: Urology note from December 2023 which documents frequent urinary tract infections, had a postvoid residual of 900 then and had an In-N-Out catheterization, discussion about Foley catheter but patient was resistant to the idea at the time and was discharged with instructions to self cath at home.      Physical Exam   Triage Vital Signs: ED Triage Vitals  Enc Vitals Group     BP 12/25/22 0541 124/72     Pulse Rate 12/25/22 0541 93     Resp 12/25/22 0541 20     Temp --      Temp Source 12/25/22 0541 Oral     SpO2 12/25/22 0541 95 %     Weight --      Height --      Head Circumference --      Peak Flow --      Pain Score 12/25/22 0539 0     Pain Loc --      Pain Edu? --      Excl. in [place]? --     Most recent vital signs: Vitals:   12/25/22 0541 12/25/22 0548  BP: 124/72   Pulse: 93   Resp: 20   Temp:  97.8 F (36.6 C)  SpO2: 95%     General: Awake, no distress.  CV:  Good peripheral perfusion.  Resp:  Normal effort.  Abd:  No distention.   Other:  Awake alert oriented nontoxic comfortable appearing.  Abdomen soft nontender no distention.  Vital signs within normal limits no fever   ED Results / Procedures / Treatments   Labs (all labs ordered are listed, but only abnormal results are displayed) Labs Reviewed  BASIC METABOLIC PANEL - Abnormal; Notable for the following components:      Result Value   Glucose, Bld 159 (*)    All other components within normal limits  CBC WITH DIFFERENTIAL/PLATELET - Abnormal; Notable for the following components:   Monocytes Absolute 1.3 (*)    All other components within normal limits  URINALYSIS, ROUTINE W REFLEX MICROSCOPIC     I ordered and reviewed the above labs they are notable for hgb 13s at baseline    PROCEDURES:  Critical Care performed: No  Procedures   MEDICATIONS ORDERED IN ED: Medications - No data to display   IMPRESSION / MDM / [place] / ED COURSE  I reviewed the triage vital signs and the nursing notes.  Patient's presentation is most consistent with acute presentation with potential threat to life or bodily function.  Differential diagnosis includes, but is not limited to, hematuria due to urinary tract infection, trauma, malignancy.  Check kidney function.  Consider urinary retention due to neurogenic bladder   MDM: Patient with chronic neurologic conditions including frequent urinary tract infections, neural blood genic bladder with chronic urinary retention here with hematuria painless no other symptoms.  Check UA for infection.  Check kidney function.  Check postvoid residual.  I doubt significant blood loss given normal vital signs and hemoglobin is unchanged from prior.   Postvoid residual with approximately half liter, urology had recommended Foley catheter during the visit in December, patient is now amenable for Foley catheter placement and we will draw a UA off of this.  This patient was signed  out pending the results of her UA testing      FINAL CLINICAL IMPRESSION(S) / ED DIAGNOSES   Final diagnoses:  Hematuria, unspecified type  Urinary retention     Rx / DC Orders   ED Discharge Orders     None        Note:  This document was prepared using Dragon voice recognition software and may include unintentional dictation errors.    Lucillie Garfinkel, MD 12/25/22 [id number]    Lucillie Garfinkel, MD 12/25/22 [place], [place], MD 12/25/22 [phone]

## 2022-12-26 LAB — URINE CULTURE: Culture: NO GROWTH

## 2023-01-01 ENCOUNTER — Ambulatory Visit: Payer: Medicare Other | Admitting: Physician Assistant

## 2023-01-01 ENCOUNTER — Ambulatory Visit (INDEPENDENT_AMBULATORY_CARE_PROVIDER_SITE_OTHER): Payer: Medicare Other | Admitting: Physician Assistant

## 2023-01-01 VITALS — BP 170/85 | HR 101 | Ht 62.0 in | Wt 158.0 lb

## 2023-01-01 DIAGNOSIS — R310 Gross hematuria: Secondary | ICD-10-CM

## 2023-01-01 DIAGNOSIS — R339 Retention of urine, unspecified: Secondary | ICD-10-CM

## 2023-01-01 LAB — BLADDER SCAN AMB NON-IMAGING: Scan Result: 706

## 2023-01-01 NOTE — Progress Notes (Signed)
Catheter Removal  Patient is present today for a catheter removal.  9 ml of water was drained from the balloon. A 16 FR foley cath was removed from the bladder, no complications were noted. Patient tolerated well.  Performed by: Mariam Dollar, RMA  Follow up/ Additional notes: 3:30 pm on 01/01/2023

## 2023-01-01 NOTE — Progress Notes (Signed)
01/01/2023 4:07 PM   Cathy Harrington [birth year] [id number]  CC: Chief Complaint  Patient presents with   Follow-up   HPI: Cathy Harrington is a 77 y.o. female with PMH chronic urinary retention managed conservatively, recurrent UTIs, and simple left renal cyst who presents today for ED follow-up.   She was seen in the emergency department 7 days ago with reports of gross hematuria.  A Foley catheter was placed after she was unable to provide a urine sample with >735m urinary output.  UA with >50 RBCs/hpf and >50 WBCs/hpf.  She was discharged with Keflex and urine culture finalized with no growth.  Today she reports her gross hematuria resolved the day she left the emergency department.  She states she is feeling much better and completed Keflex yesterday.  She has no acute concerns today and still does not desire a Foley catheter.   Foley catheter removed in the morning, see separate procedure note for details.  She was unable to spontaneously void throughout the day with afternoon bladder scan 706 mL.  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)    2017 1 week [facility] and 2 weeks inpatient rehab [facility]   Hypothyroidism    Thyroid disease     Surgical History: Past Surgical History:  Procedure Laterality Date   BACK SURGERY  2000   BREAST SURGERY     COLONOSCOPY     COLONOSCOPY WITH PROPOFOL N/A 01/19/2018   Procedure: COLONOSCOPY WITH PROPOFOL;  Surgeon: SLollie Sails MD;  [facility];  Service: Endoscopy;  Laterality: N/A;   parathyroidectomy      Home Medications:  Allergies as of 01/01/2023       Reactions   Tetanus Toxoid Rash        Medication List        Accurate as of January 01, 2023  4:07 PM. If you have any questions, ask your nurse or doctor.          cephALEXin 500 MG capsule Commonly known as:  KEFLEX Take 1 capsule (500 mg total) by mouth 2 (two) times daily.   Contour Next Test test strip Generic drug: glucose blood daily.   divalproex 250 MG 24 hr tablet Commonly known as: DEPAKOTE ER Take 250 mg by mouth daily.   Januvia 100 MG tablet Generic drug: sitaGLIPtin Take 100 mg by mouth daily.   levETIRAcetam 1000 MG tablet Commonly known as: KEPPRA Take 1 tablet (1,000 mg total) by mouth 2 (two) times daily.   levETIRAcetam 500 MG tablet Commonly known as: KEPPRA Take by mouth.   levothyroxine 100 MCG tablet Commonly known as: SYNTHROID Take 100 mcg by mouth daily.   lisinopril 2.5 MG tablet Commonly known as: ZESTRIL Take 1 tablet by mouth daily.   lovastatin 10 MG tablet Commonly known as: MEVACOR Take 10 mg by mouth at bedtime.   metFORMIN 1000 MG tablet Commonly known as: GLUCOPHAGE Take 1,000 mg by mouth 2 (two) times daily.   valproic acid 250 MG capsule Commonly known as: DEPAKENE Take 1 capsule (250 mg total) by mouth 3 (three) times daily.        Allergies:  Allergies  Allergen Reactions   Tetanus Toxoid Rash    Family History: Family History  Problem Relation Age of Onset   Diabetes Brother     Social History:  reports that she has quit smoking. She has never used smokeless tobacco. She reports that she does not drink alcohol and does not use drugs.  Physical Exam: BP (!) 170/85   Pulse (!) 101   Ht '5\' 2"'$  (1.575 m)   Wt 158 lb (71.7 kg)   BMI 28.90 kg/m   Constitutional:  Alert and oriented, no acute distress, nontoxic appearing HEENT: [place], AT Cardiovascular: No clubbing, cyanosis, or edema Respiratory: Normal respiratory effort, no increased work of breathing Skin: No rashes, bruises or suspicious lesions Neurologic: Grossly intact, no focal deficits, moving all 4 extremities Psychiatric: Normal mood and affect  Laboratory Data: Results for orders placed or performed in visit on 01/01/23  BLADDER SCAN AMB NON-IMAGING   Result Value Ref Range   Scan Result 706 mL    Assessment & Plan:   1. Urinary retention Chronic and managed conservatively.  She has an elevated residual this afternoon and has not yet voided, however her residual is typically higher than this and she probably just does not need to urinate yet.  She continues to decline Foley catheter. - BLADDER SCAN AMB NON-IMAGING  2. Hematuria, gross Resolved.  She reports feeling better since completing Keflex even though her recent urine culture was negative.  Will defer cystoscopy for now but we discussed the importance of returning to clinic if her gross hematuria recurs.  She expressed understanding.  Return if symptoms worsen or fail to improve.  Debroah Loop, PA-C  [facility] [address] [place], [place] [address] [phone]

## 2023-01-02 ENCOUNTER — Ambulatory Visit: Payer: Medicare Other | Admitting: Physician Assistant

## 2023-01-20 ENCOUNTER — Ambulatory Visit: Payer: Medicare Other | Admitting: Urology

## 2023-02-13 ENCOUNTER — Ambulatory Visit (INDEPENDENT_AMBULATORY_CARE_PROVIDER_SITE_OTHER): Payer: Medicare Other | Admitting: Physician Assistant

## 2023-02-13 ENCOUNTER — Encounter: Payer: Self-pay | Admitting: Physician Assistant

## 2023-02-13 VITALS — BP 117/83 | HR 112 | Ht 63.0 in | Wt 153.0 lb

## 2023-02-13 DIAGNOSIS — R3129 Other microscopic hematuria: Secondary | ICD-10-CM | POA: Diagnosis not present

## 2023-02-13 DIAGNOSIS — Z87440 Personal history of urinary (tract) infections: Secondary | ICD-10-CM | POA: Diagnosis not present

## 2023-02-13 DIAGNOSIS — R339 Retention of urine, unspecified: Secondary | ICD-10-CM | POA: Diagnosis not present

## 2023-02-13 DIAGNOSIS — N390 Urinary tract infection, site not specified: Secondary | ICD-10-CM

## 2023-02-13 LAB — URINALYSIS, COMPLETE
Bilirubin, UA: NEGATIVE
Glucose, UA: NEGATIVE
Ketones, UA: NEGATIVE
Leukocytes,UA: NEGATIVE
Nitrite, UA: NEGATIVE
Protein,UA: NEGATIVE
Specific Gravity, UA: 1.015 (ref 1.005–1.030)
Urobilinogen, Ur: 0.2 mg/dL (ref 0.2–1.0)
pH, UA: 6 (ref 5.0–7.5)

## 2023-02-13 LAB — MICROSCOPIC EXAMINATION

## 2023-02-13 LAB — BLADDER SCAN AMB NON-IMAGING: Scan Result: 1044

## 2023-02-13 NOTE — Progress Notes (Unsigned)
02/13/2023 2:38 PM   Lori A Zakarian 09-01-1946 [id number]  CC: Chief Complaint  Patient presents with   Urinary Retention    HPI: Cathy Harrington is a 77 y.o. female with PMH HSV encephalitis, diabetes, chronic urinary retention managed conservatively, recurrent UTI, and simple left renal cyst who presents today for UTI follow-up.   She sought care at [facility] clinic on 01/31/2023 for evaluation of possible UTI, symptoms included increased frequency, dysuria, discolored urine, and odor.  Her UA was notable for pyuria, microscopic hematuria, and bacteriuria.  Urine culture grew fluoroquinolone and tetracycline resistant E. coli.  She was started on Cipro empirically, and ultimately switched to cefdinir 300 mg twice daily x 7 days.    She reports her symptoms have resolved and she has no acute concerns today.  In-office UA today positive for trace lysed blood; urine microscopy with 11-30 RBCs/HPF. PVR .  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)    2017 1 week [facility] and 2 weeks inpatient rehab 6 months outpatient rehab at [facility]    Hypothyroidism    Thyroid disease     Surgical History: Past Surgical History:  Procedure Laterality Date   BACK SURGERY  2000   BREAST SURGERY     COLONOSCOPY     COLONOSCOPY WITH PROPOFOL N/A 01/19/2018   Procedure: COLONOSCOPY WITH PROPOFOL;  Surgeon: Christena Deem, MD;  Location: [facility]COPY;  Service: Endoscopy;  Laterality: N/A;   parathyroidectomy      Home Medications:  Allergies as of 02/13/2023       Reactions   Tetanus Toxoid Rash        Medication List        Accurate as of February 13, 2023  2:38 PM. If you have any questions, ask your nurse or doctor.          cephALEXin 500 MG capsule Commonly known as: KEFLEX Take 1 capsule (500 mg total) by mouth 2 (two) times daily.   Contour Next Test test strip Generic  drug: glucose blood daily.   divalproex 250 MG 24 hr tablet Commonly known as: DEPAKOTE ER Take 250 mg by mouth daily.   Januvia 100 MG tablet Generic drug: sitaGLIPtin Take 100 mg by mouth daily.   levETIRAcetam 1000 MG tablet Commonly known as: KEPPRA Take 1 tablet (1,000 mg total) by mouth 2 (two) times daily.   levETIRAcetam 500 MG tablet Commonly known as: KEPPRA Take by mouth.   levothyroxine 100 MCG tablet Commonly known as: SYNTHROID Take 100 mcg by mouth daily.   lisinopril 2.5 MG tablet Commonly known as: ZESTRIL Take 1 tablet by mouth daily.   lovastatin 10 MG tablet Commonly known as: MEVACOR Take 10 mg by mouth at bedtime.   metFORMIN 1000 MG tablet Commonly known as: GLUCOPHAGE Take 1,000 mg by mouth 2 (two) times daily.   valproic acid 250 MG capsule Commonly known as: DEPAKENE Take 1 capsule (250 mg total) by mouth 3 (three) times daily.        Allergies:  Allergies  Allergen Reactions   Tetanus Toxoid Rash    Family History: Family History  Problem Relation Age of Onset   Diabetes Brother     Social History:   reports that she has quit smoking. She has never used smokeless tobacco. She reports that she does not drink alcohol and does not use drugs.  Physical Exam:  BP 117/83   Pulse (!) 112   Ht  (1.6 m)   Wt 153 lb (69.4 kg)   BMI 27.10 kg/m   Constitutional:  Alert and oriented, no acute distress, nontoxic appearing HEENT: [place], AT Cardiovascular: No clubbing, cyanosis, or edema Respiratory: Normal respiratory effort, no increased work of breathing Skin: No rashes, bruises or suspicious lesions Neurologic: Grossly intact, no focal deficits, moving all 4 extremities Psychiatric: Normal mood and affect  Laboratory Data: Results for orders placed or performed in visit on 02/13/23  Microscopic Examination   Urine  Result Value Ref Range   WBC, UA 0-5 0 - 5 /hpf   RBC, Urine 11-30 (A) 0 - 2 /hpf   Epithelial Cells (non  renal) 0-10 0 - 10 /hpf   Bacteria, UA Few None seen/Few  Urinalysis, Complete  Result Value Ref Range   Specific Gravity, UA 1.015 1.005 - 1.030   pH, UA 6.0 5.0 - 7.5   Color, UA Yellow Yellow   Appearance Ur Clear Clear   Leukocytes,UA Negative Negative   Protein,UA Negative Negative/Trace   Glucose, UA Negative Negative   Ketones, UA Negative Negative   RBC, UA Trace (A) Negative   Bilirubin, UA Negative Negative   Urobilinogen, Ur 0.2 0.2 - 1.0 mg/dL   Nitrite, UA Negative Negative   Microscopic Examination See below:   Bladder Scan (Post Void Residual) in office  Result Value Ref Range   Scan Result 1,044    Assessment & Plan:   1. Recurrent UTI Completed culture appropriate antibiotics.  UA is bland today with the exception of microscopic hematuria, see #3 below. - Urinalysis, Complete  2. Urinary retention Chronic, PVR is stably elevated today.  Will plan to see her back in clinic in 3 months with a renal ultrasound prior.  We discussed that new hydronephrosis or worsening renal function will be indicators for further intervention with Foley catheter. - Bladder Scan (Post Void Residual) in office - Ultrasound renal complete; Future  3. Microscopic hematuria Improved after completing culture appropriate antibiotics.  Likely related to underlying urinary retention with bladder distention.  Will repeat a UA in 3 months and consider cystoscopy if persistent.  Return in about 3 months (around 05/15/2023) for follow up w/ renal scan prior, UA, and PVR .  Carman Ching, PA-C  [facility] [place] [address] [place], [place] [address] [phone]

## 2023-05-13 ENCOUNTER — Ambulatory Visit
Admission: RE | Admit: 2023-05-13 | Discharge: 2023-05-13 | Disposition: A | Discharge status: Home / Self Care | Payer: Medicare Other | Source: Ambulatory Visit | Attending: Physician Assistant | Admitting: Physician Assistant

## 2023-05-13 DIAGNOSIS — R339 Retention of urine, unspecified: Secondary | ICD-10-CM | POA: Insufficient documentation

## 2023-05-13 DIAGNOSIS — N281 Cyst of kidney, acquired: Secondary | ICD-10-CM | POA: Diagnosis not present

## 2023-05-15 ENCOUNTER — Ambulatory Visit (INDEPENDENT_AMBULATORY_CARE_PROVIDER_SITE_OTHER): Payer: Medicare Other | Admitting: Physician Assistant

## 2023-05-15 ENCOUNTER — Encounter: Payer: Self-pay | Admitting: Physician Assistant

## 2023-05-15 VITALS — BP 134/83 | HR 99 | Wt 161.0 lb

## 2023-05-15 DIAGNOSIS — N319 Neuromuscular dysfunction of bladder, unspecified: Secondary | ICD-10-CM

## 2023-05-15 DIAGNOSIS — Z87440 Personal history of urinary (tract) infections: Secondary | ICD-10-CM | POA: Diagnosis not present

## 2023-05-15 DIAGNOSIS — Z09 Encounter for follow-up examination after completed treatment for conditions other than malignant neoplasm: Secondary | ICD-10-CM

## 2023-05-15 LAB — BLADDER SCAN AMB NON-IMAGING

## 2023-05-15 NOTE — Progress Notes (Signed)
05/15/2023 2:29 PM   Tykiera A Santori 1945/11/12 [id number]  CC: Chief Complaint  Patient presents with   Urinary Retention   HPI: Cathy Harrington is a 77 y.o. female with PMH HSV encephalitis, diabetes, chronic urinary retention managed conservatively, recurrent UTI, and simple left renal cyst who presents today for routine follow-up.   Today she reports no acute concerns.  She is nervous about her renal ultrasound results and anxious about the idea of needing a urinary catheter.  She admits to nocturia x 3 but otherwise is pleased.  Renal ultrasound 2 days ago with no hydronephrosis.  She is unable to provide a urine specimen for testing today.  Bladder scan 903 mL.  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)    2017 1 week [facility] and 2 weeks inpatient rehab 6 months outpatient rehab at [facility]    Hypothyroidism    Thyroid disease     Surgical History: Past Surgical History:  Procedure Laterality Date   BACK SURGERY  2000   BREAST SURGERY     COLONOSCOPY     COLONOSCOPY WITH PROPOFOL N/A 01/19/2018   Procedure: COLONOSCOPY WITH PROPOFOL;  Surgeon: Christena Deem, MD;  Location: [facility] ENDOSCOPY;  Service: Endoscopy;  Laterality: N/A;   parathyroidectomy      Home Medications:  Allergies as of 05/15/2023       Reactions   Tetanus Toxoid Rash        Medication List        Accurate as of May 15, 2023  2:29 PM. If you have any questions, ask your nurse or doctor.          cephALEXin 500 MG capsule Commonly known as: KEFLEX Take 1 capsule (500 mg total) by mouth 2 (two) times daily.   Contour Next Test test strip Generic drug: glucose blood daily.   divalproex 250 MG 24 hr tablet Commonly known as: DEPAKOTE ER Take 250 mg by mouth daily.   Januvia 100 MG tablet Generic drug: sitaGLIPtin Take 100 mg by mouth daily.   levETIRAcetam 1000 MG  tablet Commonly known as: KEPPRA Take 1 tablet (1,000 mg total) by mouth 2 (two) times daily.   levETIRAcetam 500 MG tablet Commonly known as: KEPPRA Take by mouth.   levothyroxine 100 MCG tablet Commonly known as: SYNTHROID Take 100 mcg by mouth daily.   lisinopril 2.5 MG tablet Commonly known as: ZESTRIL Take 1 tablet by mouth daily.   lovastatin 10 MG tablet Commonly known as: MEVACOR Take 10 mg by mouth at bedtime.   metFORMIN 1000 MG tablet Commonly known as: GLUCOPHAGE Take 1,000 mg by mouth 2 (two) times daily.   valproic acid 250 MG capsule Commonly known as: DEPAKENE Take 1 capsule (250 mg total) by mouth 3 (three) times daily.        Allergies:  Allergies  Allergen Reactions   Tetanus Toxoid Rash    Family History: Family History  Problem Relation Age of Onset   Diabetes Brother     Social History:   reports that she has quit smoking. She has never used smokeless tobacco. She reports that she does not drink alcohol and does not use drugs.  Physical Exam: BP 134/83   Pulse 99   Wt 161 lb (73 kg)   BMI 28.52 kg/m   Constitutional:  Alert and oriented, no acute distress, nontoxic appearing HEENT: [place],  AT Cardiovascular: No clubbing, cyanosis, or edema Respiratory: Normal respiratory effort, no increased work of breathing Skin: No rashes, bruises or suspicious lesions Neurologic: Grossly intact, no focal deficits, moving all 4 extremities Psychiatric: Normal mood and affect  Laboratory Data: Results for orders placed or performed in visit on 05/15/23  Bladder Scan (Post Void Residual) in office  Result Value Ref Range   Scan Result    Pertinent Imaging: Results for orders placed during the hospital encounter of 05/13/23  Ultrasound renal complete  Narrative CLINICAL DATA:  Urinary retention.  EXAM: RENAL / URINARY TRACT ULTRASOUND COMPLETE  COMPARISON:  September 05, 2022  FINDINGS: Right Kidney:  Renal measurements: 10.9 x  3.8 x 4.2 cm = volume: 90.84 mL. Echogenicity within normal limits. No mass or hydronephrosis visualized.  Left Kidney:  Renal measurements: 10.8 x 6.2 x 4 cm = volume: 138.16 mL. Echogenicity within normal limits. No hydronephrosis visualized. There is a 1.6 x 1.6 x 1.2 cm simple cyst in the upper pole. No follow-up is recommended.  Bladder:  There is question of bladder diverticulum. Bilateral ureteral jets are noted. Prevoid bladder volume 1,004.77 mL. Postvoid bladder volume 763.78 mL.  Other:  None.  IMPRESSION: 1. No acute abnormality. No hydronephrosis. 2. Possible bladder diverticulum. Postvoid bladder volume 763.78 mL.   Electronically Signed By: Sherian Rein M.D. On: 05/14/2023 09:28  I personally reviewed the images referenced above and note no hydronephrosis and a distended urinary bladder consistent with her baseline.  Assessment & Plan:   1. Neuromuscular dysfunction of bladder, unspecified Chronic urinary retention, stable, with no evidence of upper tract involvement on renal ultrasound.  Will continue to manage conservatively.  We again discussed that indications for more active management would include hydronephrosis, flank pain, and frequent UTIs.  We discussed that nocturia is likely due to her very full bladder and unfortunately I have nothing to offer for this. - Bladder Scan (Post Void Residual) in office - US RENAL; Future   Return in about 6 months (around 11/15/2023) for Urinary retention follow-up with RUS prior.  Carman Ching, PA-C  [facility] [place] [address], [address] [place], [place] [address] [phone]

## 2023-06-26 ENCOUNTER — Ambulatory Visit: Payer: Medicare Other | Admitting: Physician Assistant

## 2023-07-21 ENCOUNTER — Other Ambulatory Visit: Payer: Self-pay | Admitting: Neurology

## 2023-07-21 DIAGNOSIS — G8929 Other chronic pain: Secondary | ICD-10-CM

## 2023-07-23 ENCOUNTER — Ambulatory Visit
Admission: RE | Admit: 2023-07-23 | Discharge: 2023-07-23 | Disposition: A | Discharge status: Home / Self Care | Payer: Medicare Other | Source: Ambulatory Visit | Attending: Neurology | Admitting: Neurology

## 2023-07-23 DIAGNOSIS — M5442 Lumbago with sciatica, left side: Secondary | ICD-10-CM | POA: Diagnosis present

## 2023-07-23 DIAGNOSIS — G8929 Other chronic pain: Secondary | ICD-10-CM | POA: Diagnosis present

## 2023-11-12 ENCOUNTER — Ambulatory Visit
Admission: RE | Admit: 2023-11-12 | Discharge: 2023-11-12 | Disposition: A | Discharge status: Home / Self Care | Payer: Medicare Other | Source: Ambulatory Visit | Attending: Physician Assistant | Admitting: Physician Assistant

## 2023-11-12 DIAGNOSIS — N319 Neuromuscular dysfunction of bladder, unspecified: Secondary | ICD-10-CM | POA: Insufficient documentation

## 2023-11-16 ENCOUNTER — Ambulatory Visit (INDEPENDENT_AMBULATORY_CARE_PROVIDER_SITE_OTHER): Payer: Medicare Other | Admitting: Physician Assistant

## 2023-11-16 VITALS — BP 141/78 | HR 98 | Ht 62.0 in | Wt 162.0 lb

## 2023-11-16 DIAGNOSIS — N319 Neuromuscular dysfunction of bladder, unspecified: Secondary | ICD-10-CM

## 2023-11-16 NOTE — Progress Notes (Signed)
 11/16/2023 2:21 PM   Cathy Harrington 08/07/1946 [id number]  CC: Chief Complaint  Patient presents with   Other   HPI: Cathy Harrington is a 78 y.o. female with PMH HSV encephalitis, diabetes, chronic urinary retention managed conservatively, recurrent UTI, and simple left renal cyst who presents today for routine follow-up.   Today she reports 1 UTI since her last office visit, occurring 1 to 2 months ago and managed by her PCP.  Urine culture grew Aerococcus urinae. She denies abdominal pain or difficulty voiding at baseline.  Renal ultrasound dated 11/12/2023 showed no hydronephrosis.  Prevoid bladder volume was measured at 713 mL, but postvoid volume was measured at 911 mL.  This is rather stable compared to prior.  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)    2017 1 week [facility] and 2 weeks inpatient rehab 6 months outpatient rehab at [facility]    Hypothyroidism    Thyroid disease     Surgical History: Past Surgical History:  Procedure Laterality Date   BACK SURGERY  2000   BREAST SURGERY     COLONOSCOPY     COLONOSCOPY WITH PROPOFOL  N/A 01/19/2018   Procedure: COLONOSCOPY WITH PROPOFOL ;  Surgeon: Gaylyn Gladis PENNER, MD;  Location: [facility] ENDOSCOPY;  Service: Endoscopy;  Laterality: N/A;   parathyroidectomy      Home Medications:  Allergies as of 11/16/2023       Reactions   Tetanus Toxoid Rash        Medication List        Accurate as of November 16, 2023  2:21 PM. If you have any questions, ask your nurse or doctor.          STOP taking these medications    cephALEXin  500 MG capsule Commonly known as: KEFLEX    valproic  acid 250 MG capsule Commonly known as: DEPAKENE        TAKE these medications    Contour Next Test test strip Generic drug: glucose blood daily.   divalproex  250 MG 24 hr tablet Commonly known as: DEPAKOTE  ER Take 250 mg by mouth  daily.   Januvia 100 MG tablet Generic drug: sitaGLIPtin Take 100 mg by mouth daily.   levETIRAcetam  1000 MG tablet Commonly known as: KEPPRA  Take 1 tablet (1,000 mg total) by mouth 2 (two) times daily.   levETIRAcetam  500 MG tablet Commonly known as: KEPPRA  Take by mouth.   levothyroxine  100 MCG tablet Commonly known as: SYNTHROID  Take 100 mcg by mouth daily.   lisinopril 2.5 MG tablet Commonly known as: ZESTRIL Take 1 tablet by mouth daily.   lovastatin 10 MG tablet Commonly known as: MEVACOR Take 10 mg by mouth at bedtime.   metFORMIN  1000 MG tablet Commonly known as: GLUCOPHAGE  Take 1,000 mg by mouth 2 (two) times daily.        Allergies:  Allergies  Allergen Reactions   Tetanus Toxoid Rash    Family History: Family History  Problem Relation Age of Onset   Diabetes Brother     Social History:   reports that she has quit smoking. She has never used smokeless tobacco. She reports that she does not drink alcohol and does not use drugs.  Physical Exam: BP (!) 141/78   Pulse 98   Ht 5' 2 (1.575 m)   Wt 162 lb (73.5 kg)   BMI 29.63 kg/m   Constitutional:  Alert and oriented,  no acute distress, nontoxic appearing HEENT: [place], AT Cardiovascular: No clubbing, cyanosis, or edema Respiratory: Normal respiratory effort, no increased work of breathing Skin: No rashes, bruises or suspicious lesions Neurologic: Grossly intact, no focal deficits, moving all 4 extremities Psychiatric: Normal mood and affect  Pertinent Imaging: Results for orders placed during the hospital encounter of 11/12/23  US  RENAL  Narrative CLINICAL DATA:  Chronic renal retention follow-up.  EXAM: RENAL / URINARY TRACT ULTRASOUND COMPLETE  COMPARISON:  July 10th 2024  FINDINGS: Right Kidney:  Renal measurements: 10.7 x 4.3 x 4.1 cm = volume: 98.47 mL. Echogenicity within normal limits. No mass or hydronephrosis visualized.  Left Kidney:  Renal measurements: 10.6 x 5.4 x  5.6 cm = volume: 138.32 mL. Echogenicity within normal limits. No hydronephrosis visualized. There is a 1.8 x 1.7 x 1.7 cm simple cyst in the upper pole left kidney. No follow-up is recommended.  Bladder:  There is probably a bladder diverticulum in the posterior bladder. Prevoid volume of 712.89 cc, postvoid volume of 910.64 cc.  Other:  None.  IMPRESSION: 1. No acute abnormality identified. No hydronephrosis bilaterally. 2. 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 3. Prevoid volume of 712.89 cc, postvoid volume of 910.64 cc.   Electronically Signed By: Craig Farr M.D. On: 11/12/2023 15:41  I personally reviewed the images referenced above and note no hydronephrosis.  Assessment & Plan:   1. Neuromuscular dysfunction of bladder, unspecified (Primary) Chronic, stable urinary retention with no evidence of upper tract involvement.  She is asymptomatic at baseline and has occasional UTIs.  Will continue to monitor and manage her bladder conservatively given her cognitive status.  She is in agreement with this plan. - US  RENAL; Future   Return in about 6 months (around 05/15/2024) for Chronic retention f/u with RUS prior.  Lucie Hones, PA-C  [facility] [place] [address], [address] [place], [place] [address] [phone]

## 2024-01-12 ENCOUNTER — Telehealth: Payer: Self-pay

## 2024-01-12 NOTE — Telephone Encounter (Signed)
 Pt states she noticed blood in urine last night and some this morning. Pt state no pain/burning with urination, states no blood clots.  Pt was scheduled to see a PA this Friday 01/15/2024. Pt voiced understanding.

## 2024-01-13 NOTE — Progress Notes (Signed)
 01/15/2024 9:47 PM   Cathy Harrington 05/15/1946 [id number]  Referring provider: Jenell Milliner, MD [phone] S. [address] [place],  [place] [address]  Urological history: 1. Neurogenic bladder/chronic urinary retention -cysto (07/2021) NED -UDS (08/2021) spontaneous bladder contractions, but she has no evidence of reflux and otherwise low bladder pressure with good compliance  -Serum creatinine (12/2023) 0.9   2. rUTI's -Contributing factors of age, vaginal atrophy, diabetes and chronic urinary retention -RUS (11/12/2023) - bladder diverticulum  -Documented urine cultures over the last year  December 25, 2023-mixed urogenital flora  November 30, 2023-Staphylococcus hemolyticus and aerococcus urinae  September 29, 2023-aerococcus urinae  July 20, 2023-enterobacter asburiae  January 31, 2023-E. coli               3. Renal cyst -RUS (09/2022) 1. 5 cm simple left renal cyst  Chief Complaint  Patient presents with   Follow-up   HPI: Cathy Harrington is a 78 y.o. female who presents today for hematuria.    Previous records reviewed.   Starting on Monday, she had 2 to 3 days of gross hematuria.  It has since cleared.  Patient denies any modifying or aggravating factors.  Patient denies any dysuria or suprapubic/flank pain.  Patient denies any fevers, chills, nausea or vomiting.    UA yellow cloudy, specific gravity 1.010, 2+ heme, pH 5.5, 2+ leukocytes, > 30 WBC's, > 30 RBC's, 0-10 epithelial cells and many bacteria.    She had an UTI in January which was treated by her PCP.    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)    2017 1 week [facility] and 2 weeks inpatient rehab 6 months outpatient rehab at [facility]    Hypothyroidism    Thyroid disease     Surgical History: Past Surgical History:  Procedure Laterality Date   BACK SURGERY  2000   BREAST SURGERY      COLONOSCOPY     COLONOSCOPY WITH PROPOFOL N/A 01/19/2018   Procedure: COLONOSCOPY WITH PROPOFOL;  Surgeon: Christena Deem, MD;  Location: [facility] ENDOSCOPY;  Service: Endoscopy;  Laterality: N/A;   parathyroidectomy      Home Medications:  Allergies as of 01/15/2024       Reactions   Tetanus Toxoid Rash        Medication List        Accurate as of January 15, 2024 11:59 PM. If you have any questions, ask your nurse or doctor.          cefUROXime 250 MG tablet Commonly known as: CEFTIN Take 1 tablet (250 mg total) by mouth 2 (two) times daily with a meal for 7 days.   Contour Next Test test strip Generic drug: glucose blood daily.   divalproex 250 MG 24 hr tablet Commonly known as: DEPAKOTE ER Take 250 mg by mouth daily.   Fingerstix Lancets Misc Use 1 each once daily Use as instructed.   Januvia 100 MG tablet Generic drug: sitaGLIPtin Take 100 mg by mouth daily.   levETIRAcetam 1000 MG tablet Commonly known as: KEPPRA Take 1 tablet (1,000 mg total) by mouth 2 (two) times daily.   levETIRAcetam 500 MG tablet Commonly known as: KEPPRA Take by mouth.   levothyroxine 100 MCG tablet Commonly known as: SYNTHROID Take 100 mcg by mouth daily.   lisinopril 2.5 MG tablet Commonly known as: ZESTRIL Take  1 tablet by mouth daily.   lovastatin 10 MG tablet Commonly known as: MEVACOR Take 10 mg by mouth at bedtime.   metFORMIN 1000 MG tablet Commonly known as: GLUCOPHAGE Take 1,000 mg by mouth 2 (two) times daily.   pregabalin 50 MG capsule Commonly known as: LYRICA Take by mouth.        Allergies:  Allergies  Allergen Reactions   Tetanus Toxoid Rash    Family History: Family History  Problem Relation Age of Onset   Diabetes Brother     Social History:  reports that she has quit smoking. She has never used smokeless tobacco. She reports that she does not drink alcohol and does not use drugs.  ROS: Pertinent ROS in HPI  Physical Exam: BP  110/72   Pulse 99   Constitutional:  Well nourished. Alert and oriented, No acute distress. HEENT: [place] AT, moist mucus membranes.  Trachea midline Cardiovascular: No clubbing, cyanosis, or edema. Respiratory: Normal respiratory effort, no increased work of breathing. Neurologic: Grossly intact, no focal deficits, moving all 4 extremities. Psychiatric: Normal mood and affect.    Laboratory Data: CBC w/auto Differential (5 Part) Order: [id number] Component Ref Range & Units 2 wk ago  WBC (White Blood Cell Count) 4.1 - 10.2 10^3/uL 5.2  RBC (Red Blood Cell Count) 4.04 - 5.48 10^6/uL 4.5  Hemoglobin 12.0 - 15.0 gm/dL 13  Hematocrit 62.1 - 30.8 % 39.8  MCV (Mean Corpuscular Volume) 80.0 - 100.0 fl 88.4  MCH (Mean Corpuscular Hemoglobin) 27.0 - 31.2 pg 28.9  MCHC (Mean Corpuscular Hemoglobin Concentration) 32.0 - 36.0 gm/dL 65.7  Platelet Count 846 - 450 10^3/uL 227  RDW-CV (Red Cell Distribution Width) 11.6 - 14.8 % 14.2  MPV (Mean Platelet Volume) 9.4 - 12.4 fl 11.2  Neutrophils 1.50 - 7.80 10^3/uL 2.78  Lymphocytes 1.00 - 3.60 10^3/uL 1.29  Monocytes 0.00 - 1.50 10^3/uL 0.99  Eosinophils 0.00 - 0.55 10^3/uL 0.06  Basophils 0.00 - 0.09 10^3/uL 0.03  Neutrophil % 32.0 - 70.0 % 53.4  Lymphocyte % 10.0 - 50.0 % 24.9  Monocyte % 4.0 - 13.0 % 19.1 High   Eosinophil % 1.0 - 5.0 % 1.2  Basophil% 0.0 - 2.0 % 0.6  Immature Granulocyte % <=0.7 % 0.8 High   Immature Granulocyte Count <=0.06 10^3/L 0.04  Resulting Agency [facility]   Specimen Collected: 12/29/23 11:30   Performed by: [facility] Last Resulted: 12/29/23 12:03  Received From: [facility]  Result Received: 01/12/24 10:13   tains abnormal data Comprehensive Metabolic Panel (CMP) Order: [id number] Component Ref Range & Units 2 wk ago  Glucose 70 - 110 mg/dL 324 High   Sodium 401 - 145 mmol/L 140  Potassium 3.6 - 5.1 mmol/L 4.8  Chloride 97 - 109  mmol/L 102  Carbon Dioxide (CO2) 22.0 - 32.0 mmol/L 28.4  Urea Nitrogen (BUN) 7 - 25 mg/dL 21  Creatinine 0.6 - 1.1 mg/dL 0.9  Glomerular Filtration Rate (eGFR) >60 mL/min/1.73sq m 66  Comment: CKD-EPI (2021) does not include patient's race in the calculation of eGFR.  Monitoring changes of plasma creatinine and eGFR over time is useful for monitoring kidney function.  Interpretive Ranges for eGFR (CKD-EPI 2021):  eGFR:       >60 mL/min/1.73 sq. m - Normal eGFR:       30-59 mL/min/1.73 sq. m - Moderately Decreased eGFR:       15-29 mL/min/1.73 sq. m  - Severely Decreased eGFR:       <  15 mL/min/1.73 sq. m  - Kidney Failure   Note: These eGFR calculations do not apply in acute situations when eGFR is changing rapidly or patients on dialysis.  Calcium 8.7 - 10.3 mg/dL 10  AST 8 - 39 U/L 14  ALT 5 - 38 U/L 14  Alk Phos (alkaline Phosphatase) 34 - 104 U/L 57  Albumin 3.5 - 4.8 g/dL 4.3  Bilirubin, Total 0.3 - 1.2 mg/dL 0.3  Protein, Total 6.1 - 7.9 g/dL 7.1  A/G Ratio 1.0 - 5.0 gm/dL 1.5  Resulting Agency [facility]   Specimen Collected: 12/29/23 11:30   Performed by: [facility] Last Resulted: 12/29/23 16:24  Received From: [facility]  Result Received: 01/12/24 10:13   Hemoglobin A1C Order: [id number] Component Ref Range & Units 2 wk ago  Hemoglobin A1C 4.2 - 5.6 % 7.2 High   Average Blood Glucose (Calc) mg/dL 469  Resulting Agency [facility]  Narrative Performed by [facility] Normal Range:    4.2 - 5.6% Increased Risk:  5.7 - 6.4% Diabetes:        >= 6.5% Glycemic Control for adults with diabetes:  <7%    Specimen Collected: 12/29/23 11:30   Performed by: [facility] Last Resulted: 12/29/23 13:59  Received From: [facility]  Result Received: 01/12/24 10:13   Urinalysis See EPIC and HPI  I have reviewed the labs.   Pertinent  Imaging: CLINICAL DATA:  Chronic renal retention follow-up.   EXAM: RENAL / URINARY TRACT ULTRASOUND COMPLETE   COMPARISON:  July 10th 2024   FINDINGS: Right Kidney:   Renal measurements: 10.7 x 4.3 x 4.1 cm = volume: 98.47 mL. Echogenicity within normal limits. No mass or hydronephrosis visualized.   Left Kidney:   Renal measurements: 10.6 x 5.4 x 5.6 cm = volume: 138.32 mL. Echogenicity within normal limits. No hydronephrosis visualized. There is a 1.8 x 1.7 x 1.7 cm simple cyst in the upper pole left kidney. No follow-up is recommended.   Bladder:   There is probably a bladder diverticulum in the posterior bladder. Prevoid volume of 712.89 cc, postvoid volume of 910.64 cc.   Other:   None.   IMPRESSION: 1. No acute abnormality identified. No hydronephrosis bilaterally. 2. 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 3. Prevoid volume of 712.89 cc, postvoid volume of 910.64 cc.     Electronically Signed   By: Sherian Rein M.D.   On: 11/12/2023 15:41   01/15/24 11:21  Scan Result 726 ml   I have independently reviewed the films.  See HPI.    Assessment & Plan:    1. Gross hematuria -UA grossly infected -urine culture pending  -Started on Cetin 250 mg BID x 7 days -explained that if urine culture was negative, we will need to schedule her for a cystoscopy and if is positive, we will treat with culture appropriate antibiotics and reassess -discussed that blood in the urine can be caused by UTI, stone or cancers  2. Incomplete bladder empyting -recent RUS was negative for hydronephrosis -continue to monitor    Return for pending urine culture results .  These notes generated with voice recognition software. I apologize for typographical errors.  Cloretta Ned  [facility] [address]  [address] [place], [place] [address] [phone]

## 2024-01-15 ENCOUNTER — Ambulatory Visit (INDEPENDENT_AMBULATORY_CARE_PROVIDER_SITE_OTHER): Admitting: Urology

## 2024-01-15 VITALS — BP 110/72 | HR 99

## 2024-01-15 DIAGNOSIS — R310 Gross hematuria: Secondary | ICD-10-CM | POA: Diagnosis not present

## 2024-01-15 DIAGNOSIS — N319 Neuromuscular dysfunction of bladder, unspecified: Secondary | ICD-10-CM | POA: Diagnosis not present

## 2024-01-15 LAB — URINALYSIS, COMPLETE
Bilirubin, UA: NEGATIVE
Glucose, UA: NEGATIVE
Ketones, UA: NEGATIVE
Nitrite, UA: NEGATIVE
Protein,UA: NEGATIVE
Specific Gravity, UA: 1.01 (ref 1.005–1.030)
Urobilinogen, Ur: 0.2 mg/dL (ref 0.2–1.0)
pH, UA: 5.5 (ref 5.0–7.5)

## 2024-01-15 LAB — MICROSCOPIC EXAMINATION
RBC, Urine: >30 /HPF — AB (ref 0–2)
WBC, UA: >30 /HPF — AB (ref 0–5)

## 2024-01-15 LAB — BLADDER SCAN AMB NON-IMAGING: Scan Result: 726

## 2024-01-15 MED ORDER — CEFUROXIME AXETIL 250 MG PO TABS: 250.0000 mg | ORAL_TABLET | Freq: Two times a day (BID) | ORAL | 0 refills | Status: DC

## 2024-01-17 ENCOUNTER — Encounter: Payer: Self-pay | Admitting: Urology

## 2024-01-19 LAB — CULTURE, URINE COMPREHENSIVE

## 2024-01-21 ENCOUNTER — Ambulatory Visit (INDEPENDENT_AMBULATORY_CARE_PROVIDER_SITE_OTHER): Admitting: Urology

## 2024-01-21 VITALS — BP 143/85 | HR 103

## 2024-01-21 DIAGNOSIS — N390 Urinary tract infection, site not specified: Secondary | ICD-10-CM

## 2024-01-21 DIAGNOSIS — R310 Gross hematuria: Secondary | ICD-10-CM

## 2024-01-21 DIAGNOSIS — N319 Neuromuscular dysfunction of bladder, unspecified: Secondary | ICD-10-CM

## 2024-01-21 NOTE — Progress Notes (Signed)
   01/21/24  CC:  Chief Complaint  Patient presents with   Cysto    HPI: 78 year old female with a personal history of chronic urinary retention who presents today for cystoscopy.  Please see previous notes for details.  She had an episode of gross hematuria likely related to infection in the emergency room.  She is presents today for cystoscopic evaluation.  She currently on cefuroxime.  There were no vitals taken for this visit. NED. A&Ox3.   No respiratory distress   Abd soft, NT, ND Normal external genitalia with patent urethral meatus  Cystoscopy Procedure Note  Patient identification was confirmed, informed consent was obtained, and patient was prepped using Betadine solution.  Lidocaine jelly was administered per urethral meatus.    Procedure: - Flexible cystoscope introduced, without any difficulty.   - Thorough search of the bladder revealed:    normal urethral meatus    normal urothelium however visualization today was poor.  A red rubber catheter was utilized to drain her bladder.  Upon reintroducing the scope, there was still fluid retained within the bladder which was cloudy.  We evacuated about 500 additional cc from the bladder.  We then refilled with sterile water.  Despite this, visualization was not ideal.  No obvious tumors masses or lesions were identified.    Post-Procedure: - Patient tolerated the procedure well  Assessment/ Plan:  1. Gross hematuria (Primary) Visualization today was suboptimal although no obvious lesions were identified  Suspect that she is chronically colonized in the setting of incomplete bladder emptying.  I recommended having her return in a few weeks, empty and irrigate her bladder prior to introducing the scope in order to improve visualization.  She is agreeable this plan.  2. Neurogenic bladder As above  3. Recurrent UTI As above  F/u 4 weeks with cyst (bladder emtpy/ irrigation just prior to cysto to improve  optimization)  Vanna Scotland, MD

## 2024-02-09 ENCOUNTER — Ambulatory Visit (INDEPENDENT_AMBULATORY_CARE_PROVIDER_SITE_OTHER): Admitting: Urology

## 2024-02-09 DIAGNOSIS — R310 Gross hematuria: Secondary | ICD-10-CM

## 2024-02-09 DIAGNOSIS — N319 Neuromuscular dysfunction of bladder, unspecified: Secondary | ICD-10-CM

## 2024-02-11 NOTE — Progress Notes (Signed)
   02/09/24   HPI: 78 year old with history of chronic massive urinary retention who presents today for cystoscopy.  Notably, she underwent cystoscopy a few weeks ago at which time her urine was very cloudy and visualization was poor.  Prior to the procedure today, we did drain her bladder and irrigated in order to improve visualization.  Overall, she remains asymptomatic.  She continues to have occasional intermittent gross hematuria.  There were no vitals taken for this visit. NED. A&Ox3.   No respiratory distress   Abd soft, NT, ND Normal external genitalia with patent urethral meatus.  Prolapse had to be reduced in order to visualize the meatus.  Prolapse was primarily anterior.  Cystoscopy Procedure Note  Patient identification was confirmed, informed consent was obtained, and patient was prepped using Betadine solution.  Lidocaine jelly was administered per urethral meatus.    Procedure: - Flexible cystoscope introduced, without any difficulty.   - Thorough search of the bladder revealed:    Visualization improved today, effluent and bladder is relatively clear.  After 500 cc of sterile water, the bladder still appeared to be slightly under distended/capacious.  The bladder itself was relatively spared although there was fairly significant posterior wall descent requiring manual reduction of the bladder through anterior vaginal compression in order to visualize this mucosa.  Notably, this mucosa in particular had an inflamed type appearance with some chronic edema.  There was no papillary tumor noted.  This appeared to be definitively inflammatory without concern for a malignant process. - Ureteral orifices were normal in position and appearance.  Post-Procedure: - Patient tolerated the procedure well  Assessment/ Plan:  1. Gross hematuria (Primary) Suspect secondary to chronic inflammation from incomplete emptying and cystocele  No other pathology identified in the  bladder  Will continue to follow  2. Neurogenic bladder Chronic retention, see previous notes   Dustin Gimenez, MD

## 2024-03-18 ENCOUNTER — Ambulatory Visit: Admitting: Urology

## 2024-05-16 ENCOUNTER — Ambulatory Visit: Payer: Self-pay | Admitting: Physician Assistant

## 2024-05-16 ENCOUNTER — Ambulatory Visit
Admission: RE | Admit: 2024-05-16 | Discharge: 2024-05-16 | Disposition: A | Discharge status: Home / Self Care | Source: Ambulatory Visit | Attending: Physician Assistant | Admitting: Physician Assistant

## 2024-05-16 DIAGNOSIS — N319 Neuromuscular dysfunction of bladder, unspecified: Secondary | ICD-10-CM | POA: Insufficient documentation

## 2024-05-23 ENCOUNTER — Ambulatory Visit (INDEPENDENT_AMBULATORY_CARE_PROVIDER_SITE_OTHER): Admitting: Physician Assistant

## 2024-05-23 ENCOUNTER — Encounter: Payer: Self-pay | Admitting: Physician Assistant

## 2024-05-23 VITALS — BP 107/64 | HR 90 | Ht 63.0 in | Wt 160.0 lb

## 2024-05-23 DIAGNOSIS — R339 Retention of urine, unspecified: Secondary | ICD-10-CM | POA: Diagnosis not present

## 2024-05-23 DIAGNOSIS — N1339 Other hydronephrosis: Secondary | ICD-10-CM | POA: Diagnosis not present

## 2024-05-23 DIAGNOSIS — N8111 Cystocele, midline: Secondary | ICD-10-CM

## 2024-05-23 NOTE — Progress Notes (Unsigned)
 05/23/2024 3:25 PM   Cathy Harrington November 17, 1945 [id number]  CC: Chief Complaint  Patient presents with   Follow-up   HPI: Cathy Harrington is a 78 y.o. 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   She had a cystoscopy with Dr. Penne on 02/09/2024 in the setting of gross hematuria.  At the time, she was noted to have cystocele that required manual reduction to fully evaluate the bladder.  Cystoscopy was otherwise benign.  Renal ultrasound dated 05/16/2024 showed chronic urinary retention with new, bilateral mild hydronephrosis.  Today she reports ***  In-office UA today positive for ***; urine microscopy with *** WBCs/HPF, *** RBCs/HPF, and ***. PVR ***mL.  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)    2017 1 week [facility] and 2 weeks inpatient rehab 6 months outpatient rehab at [facility]    Hypothyroidism    Thyroid disease     Surgical History: Past Surgical History:  Procedure Laterality Date   BACK SURGERY  2000   BREAST SURGERY     COLONOSCOPY     COLONOSCOPY WITH PROPOFOL  N/A 01/19/2018   Procedure: COLONOSCOPY WITH PROPOFOL ;  Surgeon: Gaylyn Gladis PENNER, MD;  Location: [facility] ENDOSCOPY;  Service: Endoscopy;  Laterality: N/A;   parathyroidectomy      Home Medications:  Allergies as of 05/23/2024       Reactions   Tetanus Toxoid Rash        Medication List        Accurate as of May 23, 2024  3:25 PM. If you have any questions, ask your nurse or doctor.          Contour Next Test test strip Generic drug: glucose blood daily.   divalproex  250 MG 24 hr tablet Commonly known as: DEPAKOTE  ER Take 250 mg by mouth daily.   Fingerstix Lancets Misc Use 1 each once daily Use as instructed.   Januvia 100  MG tablet Generic drug: sitaGLIPtin Take 100 mg by mouth daily.   levETIRAcetam  1000 MG tablet Commonly known as: KEPPRA  Take 1 tablet (1,000 mg total) by mouth 2 (two) times daily.   levETIRAcetam  500 MG tablet Commonly known as: KEPPRA  Take by mouth.   levothyroxine  88 MCG tablet Commonly known as: SYNTHROID  Take 88 mcg by mouth daily before breakfast.   lisinopril 2.5 MG tablet Commonly known as: ZESTRIL Take 1 tablet by mouth daily.   lovastatin 10 MG tablet Commonly known as: MEVACOR Take 10 mg by mouth at bedtime.   metFORMIN  1000 MG tablet Commonly known as: GLUCOPHAGE  Take 1,000 mg by mouth 2 (two) times daily.   pregabalin 25 MG capsule Commonly known as: LYRICA Take 25 mg by mouth 2 (two) times daily.        Allergies:  Allergies  Allergen Reactions   Tetanus Toxoid Rash    Family History: Family History  Problem Relation Age of Onset   Diabetes Brother     Social History:   reports that she has quit smoking. She has never used smokeless tobacco. She reports that she does not drink alcohol and does not use drugs.  Physical Exam: BP 107/64   Pulse 90   Ht 5' 3 (1.6 m)   Wt 160 lb (72.6 kg)   BMI 28.34  kg/m   Constitutional:  Alert and oriented, no acute distress, nontoxic appearing HEENT: [place], AT Cardiovascular: No clubbing, cyanosis, or edema Respiratory: Normal respiratory effort, no increased work of breathing GI: Abdomen is soft, nontender, nondistended, no abdominal masses GU: No CVA tenderness Lymph: No cervical or inguinal lymphadenopathy Skin: No rashes, bruises or suspicious lesions Neurologic: Grossly intact, no focal deficits, moving all 4 extremities Psychiatric: Normal mood and affect  Laboratory Data: Lab Results  Component Value Date   WBC 7.4 12/25/2022   HGB 13.2 12/25/2022   HCT 41.0 12/25/2022   MCV 89.3 12/25/2022   PLT 244 12/25/2022    Lab Results  Component Value Date   CREATININE 0.85 12/25/2022     CrCl cannot be calculated (Patient's most recent lab result is older than the maximum 21 days allowed.).  Results for orders placed or performed in visit on 01/15/24  CULTURE, URINE COMPREHENSIVE   Collection Time: 01/15/24 11:16 AM   Specimen: Urine   UR  Result Value Ref Range   Urine Culture, Comprehensive Final report    Organism ID, Bacteria Comment   Microscopic Examination   Collection Time: 01/15/24 11:16 AM   Urine  Result Value Ref Range   WBC, UA >30 (A) 0 - 5 /hpf   RBC, Urine >30 (A) 0 - 2 /hpf   Epithelial Cells (non renal) 0-10 0 - 10 /hpf   Bacteria, UA Many (A) None seen/Few  Urinalysis, Complete   Collection Time: 01/15/24 11:16 AM  Result Value Ref Range   Specific Gravity, UA 1.010 1.005 - 1.030   pH, UA 5.5 5.0 - 7.5   Color, UA Yellow Yellow   Appearance Ur Cloudy (A) Clear   Leukocytes,UA 2+ (A) Negative   Protein,UA Negative Negative/Trace   Glucose, UA Negative Negative   Ketones, UA Negative Negative   RBC, UA 2+ (A) Negative   Bilirubin, UA Negative Negative   Urobilinogen, Ur 0.2 0.2 - 1.0 mg/dL   Nitrite, UA Negative Negative   Microscopic Examination See below:   BLADDER SCAN AMB NON-IMAGING   Collection Time: 01/15/24 11:21 AM  Result Value Ref Range   Scan Result 726 ml     Pertinent Imaging: KUB, ***: *** Results for orders placed during the hospital encounter of 04/24/16  DG Abd 1 View  Narrative CLINICAL DATA:  Orogastric tube placement  EXAM: ABDOMEN - 1 VIEW  COMPARISON:  None.  FINDINGS: Orogastric tube with the tip projecting over the stomach. There is no bowel dilatation to suggest obstruction. There is no evidence of pneumoperitoneum, portal venous gas or pneumatosis. There are no pathologic calcifications along the expected course of the ureters. The osseous structures are unremarkable.  IMPRESSION: Orogastric tube with the tip projecting over the stomach.   Electronically Signed By: Julaine Blanch On:  04/24/2016 21:10  No results found for this or any previous visit.  No results found for this or any previous visit.  No results found for this or any previous visit.  Results for orders placed during the hospital encounter of 05/16/24  US  RENAL  Narrative CLINICAL DATA:  Chronic urinary retention.  EXAM: RENAL / URINARY TRACT ULTRASOUND COMPLETE  COMPARISON:  11/12/2023.  FINDINGS: Right Kidney:  Length: 10.6 cm. Echogenicity within normal limits. Simple appearing upper pole cyst measures 2.0 cm. There is mild hydronephrosis.  Left Kidney:  Length: 11.3 cm. Echogenicity within normal limits. Simple cyst measuring 1.5 cm. Mild hydronephrosis.  Bladder:  No sonographic abnormalities. The prevoid volume  was 780 mL. Postvoid volume 500 mL.  IMPRESSION: 1. Bilateral mild hydronephrosis. 2. Bilateral renal cysts. 3. Significant post void residual.   Electronically Signed By: Fonda Field M.D. On: 05/17/2024 22:37  No results found for this or any previous visit.  No results found for this or any previous visit.  No results found for this or any previous visit.   I personally reviewed the images referenced above and note ***.  Assessment & Plan:   1. Microscopic hematuria (Primary) *** - Basic metabolic panel with GFR   No follow-ups on file.  Lucie Hones, PA-C  [facility] [place] [address], [address] [place], [place] [address] [phone]

## 2024-05-24 ENCOUNTER — Ambulatory Visit: Payer: Self-pay | Admitting: Physician Assistant

## 2024-05-24 LAB — BASIC METABOLIC PANEL WITH GFR
BUN/Creatinine Ratio: 24 (ref 12–28)
BUN: 27 mg/dL (ref 8–27)
CO2: 21 mmol/L (ref 20–29)
Calcium: 9.5 mg/dL (ref 8.7–10.3)
Chloride: 103 mmol/L (ref 96–106)
Creatinine, Ser: 1.14 mg/dL — ABNORMAL HIGH (ref 0.57–1.00)
Glucose: 179 mg/dL — ABNORMAL HIGH (ref 70–99)
Potassium: 5 mmol/L (ref 3.5–5.2)
Sodium: 139 mmol/L (ref 134–144)
eGFR: 50 mL/min/1.73 — ABNORMAL LOW (ref >59)

## 2024-06-22 ENCOUNTER — Other Ambulatory Visit: Payer: Self-pay | Admitting: Neurosurgery

## 2024-06-22 DIAGNOSIS — M48061 Spinal stenosis, lumbar region without neurogenic claudication: Secondary | ICD-10-CM

## 2024-06-27 ENCOUNTER — Ambulatory Visit
Admission: RE | Admit: 2024-06-27 | Discharge: 2024-06-27 | Disposition: A | Discharge status: Home / Self Care | Source: Ambulatory Visit | Attending: Neurosurgery | Admitting: Neurosurgery

## 2024-06-27 DIAGNOSIS — M48061 Spinal stenosis, lumbar region without neurogenic claudication: Secondary | ICD-10-CM | POA: Insufficient documentation

## 2024-10-11 ENCOUNTER — Encounter: Payer: Self-pay | Admitting: Physician Assistant

## 2024-10-11 ENCOUNTER — Ambulatory Visit (INDEPENDENT_AMBULATORY_CARE_PROVIDER_SITE_OTHER): Admitting: Physician Assistant

## 2024-10-11 VITALS — BP 122/78 | HR 99 | Ht 62.0 in | Wt 162.0 lb

## 2024-10-11 DIAGNOSIS — R339 Retention of urine, unspecified: Secondary | ICD-10-CM

## 2024-10-11 NOTE — Progress Notes (Signed)
 10/11/2024 3:14 PM   Cathy Harrington 1946/02/06 [id number]  CC: Chief Complaint  Patient presents with   Urinary Retention   HPI: Cathy Harrington is a 78 y.o. female with PMH HSV encephalitis, diabetes, chronic urinary retention newly managed with SP tube, recurrent UTI, and simple left renal cyst who presents today for SP tube follow-up.  She was previously a patient of our practice and her bladder was managed conservatively, however she temporarily transferred care to Dr. Jillyn at [facility] after Dr. Bjorn departure.  At that time, she had developed new hydronephrosis and ultimately an SP tube was placed.  She completed VUDS, which was notable for grade 4 VUR.  She is accompanied today by her husband, who contributes to HPI.  Now the SP tube is in place, they would like to transfer care back to our practice.  [name] questions if the SP tube was really necessary or if she will need to keep it long-term.  Mr. Monier and their son, physician, agree that this was a good idea.  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)    2017 1 week [facility] and 2 weeks inpatient rehab 6 months outpatient rehab at [facility]    Hypothyroidism    Thyroid disease     Surgical History: Past Surgical History:  Procedure Laterality Date   BACK SURGERY  2000   BREAST SURGERY     COLONOSCOPY     COLONOSCOPY WITH PROPOFOL  N/A 01/19/2018   Procedure: COLONOSCOPY WITH PROPOFOL ;  Surgeon: Gaylyn Gladis PENNER, MD;  Location: [facility] ENDOSCOPY;  Service: Endoscopy;  Laterality: N/A;   parathyroidectomy      Home Medications:  Allergies as of 10/11/2024       Reactions   Tetanus Toxoid Rash        Medication List        Accurate as of October 11, 2024  3:14 PM. If you have any questions, ask your nurse or doctor.          cephALEXin  500 MG capsule Commonly known as: KEFLEX  Take 500 mg by mouth 2  (two) times daily.   divalproex  250 MG 24 hr tablet Commonly known as: DEPAKOTE  ER Take 250 mg by mouth daily.   Fingerstix Lancets Misc Use 1 each once daily Use as instructed.   Januvia 100 MG tablet Generic drug: sitaGLIPtin Take 100 mg by mouth daily.   latanoprost 0.005 % ophthalmic solution Commonly known as: XALATAN 1 drop.   levETIRAcetam  500 MG tablet Commonly known as: KEPPRA  Take by mouth.   levothyroxine  88 MCG tablet Commonly known as: SYNTHROID  Take 88 mcg by mouth daily before breakfast.   lisinopril 2.5 MG tablet Commonly known as: ZESTRIL Take 1 tablet by mouth daily.   lovastatin 10 MG tablet Commonly known as: MEVACOR Take 10 mg by mouth at bedtime.   meloxicam 15 MG tablet Commonly known as: MOBIC Take 15 mg by mouth daily.   metFORMIN  1000 MG tablet Commonly known as: GLUCOPHAGE  Take 1,000 mg by mouth.   nitrofurantoin  (macrocrystal-monohydrate) 100 MG capsule Commonly known as: MACROBID  Take 100 mg by mouth 2 (two) times daily.   Precision QID Test test strip Generic drug: glucose blood 1 strip.   pregabalin 25 MG capsule Commonly known as: LYRICA Take 25 mg by mouth 2 (two) times daily.        Allergies:  Allergies  Allergen Reactions   Tetanus Toxoid Rash    Family History: Family History  Problem Relation Age of Onset   Diabetes Brother     Social History:   reports that she has quit smoking. She has never used smokeless tobacco. She reports that she does not drink alcohol and does not use drugs.  Physical Exam: BP 122/78   Pulse 99   Ht 5' 2 (1.575 m)   Wt 162 lb (73.5 kg)   BMI 29.63 kg/m   Constitutional:  Alert and oriented, no acute distress, nontoxic appearing HEENT: [place], AT Cardiovascular: No clubbing, cyanosis, or edema Respiratory: Normal respiratory effort, no increased work of breathing Skin: No rashes, bruises or suspicious lesions Neurologic: Grossly intact, no focal deficits, moving all 4  extremities Psychiatric: Normal mood and affect  Suprapubic Cath Change  Patient is present today for a suprapubic catheter change due to urinary retention.  8ml of water  was drained from the balloon, a 16FR foley cath was removed from the tract without difficulty.  Site was cleaned and prepped in a sterile fashion with betadine.  A 16FR foley cath was replaced into the tract no complications were noted. Urine return was noted, 10 ml of sterile water  was inflated into the balloon and a leg bag was attached for drainage.  Patient tolerated well.     Performed by: Starlena Beil, PA-C   Assessment & Plan:   1. Urinary retention (Primary) Agree with SP tube for management of chronic urinary retention.  We discussed the risk of permanent renal damage if we continue to manage her bladder conservatively.  Monthly SP tube change performed in clinic today as above.  Return in about 4 weeks (around 11/08/2024) for SPT exchange.  Lucie Hones, PA-C  [facility] [place] [address] [place], [place] [address] [phone]

## 2024-11-10 ENCOUNTER — Ambulatory Visit (INDEPENDENT_AMBULATORY_CARE_PROVIDER_SITE_OTHER): Admitting: Physician Assistant

## 2024-11-10 DIAGNOSIS — Z435 Encounter for attention to cystostomy: Secondary | ICD-10-CM | POA: Diagnosis not present

## 2024-11-10 DIAGNOSIS — G8929 Other chronic pain: Secondary | ICD-10-CM

## 2024-11-10 DIAGNOSIS — M545 Low back pain, unspecified: Secondary | ICD-10-CM

## 2024-11-10 NOTE — Progress Notes (Signed)
 Suprapubic Cath Change  Patient is present today for a suprapubic catheter change due to urinary retention.  8ml of water  was drained from the balloon, a 16FR foley cath was removed from the tract with out difficulty.  Site was cleaned and prepped in a sterile fashion with betadine.  A 16FR foley cath was replaced into the tract no complications were noted. Urine return was noted, 10 ml of sterile water  was inflated into the balloon and a leg bag was attached for drainage.  Patient tolerated well.  Performed by: Shavonna Corella, PA-C and Gabrielle Aiden, PA-C  Additional notes: She was seen by [facility] neurosurgery in December for chronic low back pain and recommended to undergo physical therapy.  She prefers to do this locally and requests a referral today.  Referral placed.  She prefers every 6-week SP tube changes.  We discussed theoretical increased risk for UTIs.  I think it is reasonable to attempt every 6 weeks changes, can reduce to 4 weeks if needed.  Follow up: Return in about 6 weeks (around 12/22/2024) for SPT exchange.

## 2024-11-14 ENCOUNTER — Ambulatory Visit: Attending: Infectious Diseases

## 2024-11-14 DIAGNOSIS — M5459 Other low back pain: Secondary | ICD-10-CM | POA: Diagnosis present

## 2024-11-14 DIAGNOSIS — R262 Difficulty in walking, not elsewhere classified: Secondary | ICD-10-CM | POA: Insufficient documentation

## 2024-11-14 NOTE — Therapy (Signed)
 " OUTPATIENT PHYSICAL THERAPY THORACOLUMBAR EVALUATION   Patient Name: Cathy Harrington MRN: [id number] DOB:[birth year], 79 y.o., female Today's Date: 11/14/2024  END OF SESSION:  PT End of Session - 11/14/24 1037     Visit Number 1    Number of Visits 17    Date for Recertification  01/13/25    PT Start Time 1037    PT Stop Time 1125    PT Time Calculation (min) 48 min    Activity Tolerance Patient tolerated treatment well    Behavior During Therapy [facility] for tasks assessed/performed          Past Medical History:  Diagnosis Date   Diabetes mellitus without complication (HCC)    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)    2017 1 week [facility] and 2 weeks inpatient rehab 6 months outpatient rehab at [facility]    Hypothyroidism    Thyroid disease    Past Surgical History:  Procedure Laterality Date   BACK SURGERY  2000   BREAST SURGERY     COLONOSCOPY     COLONOSCOPY WITH PROPOFOL  N/A 01/19/2018   Procedure: COLONOSCOPY WITH PROPOFOL ;  Surgeon: Gaylyn Gladis PENNER, MD;  Location: [facility] ENDOSCOPY;  Service: Endoscopy;  Laterality: N/A;   parathyroidectomy     Patient Active Problem List   Diagnosis Date Noted   Diabetes mellitus type 2 in nonobese (HCC)    Transaminitis    Chronic bilateral low back pain without sciatica    TBI (traumatic brain injury) (HCC) 05/12/2019   Aphasia 05/08/2019   Type 2 diabetes mellitus with obesity 05/08/2019   Hypothyroidism (acquired) 05/08/2019   Altered mental status    Pyrexia    Seizure (HCC)    Encephalitis    Seizures (HCC) 04/24/2016   Acute respiratory failure (HCC) 04/24/2016    PCP:    Epifanio Alm SQUIBB, MD        REFERRING PROVIDER: Maurine Lukes, PA-C;   Epifanio Alm SQUIBB, MD  Ana Laurita Fulling, MD  REFERRING DIAG: Diagnosis M54.50,G89.29 (ICD-10-CM) - Chronic bilateral low back pain without sciatica  Rationale for Evaluation and Treatment: Rehabilitation  THERAPY DIAG:  Other low back pain -  Plan: PT plan of care cert/re-cert  Difficulty in walking, not elsewhere classified - Plan: PT plan of care cert/re-cert  ONSET DATE: 11/09/2024 (Date PT referral signed)  SUBJECTIVE:                                                                                                                                                                                           SUBJECTIVE STATEMENT: Low back pan: at least 8/10  at worst for the past 3 months   PERTINENT HISTORY:  Chronic B low back pain without sciatica. Has left low back pain. Has surgery about at least 20 years ago for her back and felt good but during the summer, her back started bothering her. Had PT for her back last year (does not remember, might have been [facility]) due to not being able to walk to her car. Did not have the catheter though. Pain has improved since the summer.    2 months ago, pt had surgery to install a catheter because she has trouble passing urine. Pt wants to be careful of the suprapubic catheter at her R LE. Has to get the catheter replaced every 6 weeks, the catheter needs to be replaced. Neurologist aware. Pt started having difficulty passing urine about 8 months ago.    Husband present for initial evaluation per pt so husband can provide history.  Per husband,pt needs help filling out forms and speech. secondary to encephalitis about 7 years ago which affected her memory and speech.   PAIN:  Are you having pain? Yes: NPRS scale: 0-1/10 currently (not much pain) Pain location: L low back and lumbar spine.  Pain description: numb, tender Aggravating factors: walking, bending forward, movement, carries slightly heavier objects Relieving factors: sitting, laying down  PRECAUTIONS:  Has permanent suprapubic catheter for chronic urinary retention   RED FLAGS: Bowel or bladder incontinence: Yes: uses permanent catheter. and Cauda equina syndrome: No   WEIGHT BEARING RESTRICTIONS: No  FALLS:  Has patient  fallen in last 6 months? No  LIVING ENVIRONMENT: Lives with: lives with their spouse Lives in: House/apartment Stairs: Yes: Internal: 21 steps; on right going up and External: 3 steps; on right going up Has following equipment at home: Retail [employer] - 4 wheeled  OCCUPATION: Retired  PLOF: Independent. Used to walk around at [facility] for exercise  PATIENT GOALS: walk better.   NEXT MD VISIT: 3 months from now.   OBJECTIVE:  Note: Objective measures were completed at Evaluation unless otherwise noted.  DIAGNOSTIC FINDINGS:  MR LUMBAR SPINE WO CONTRAST  06/27/2024  Narrative & Impression  EXAM: MRI LUMBAR SPINE 06/27/2024 11:25:00 AM   TECHNIQUE: Multiplanar multisequence MRI of the lumbar spine was performed without the administration of intravenous contrast.   COMPARISON: MRI lumbar spine 07/23/2023.   CLINICAL HISTORY: 79 year old female with chronic back pain radiating to the left leg, status post L4-5 laminectomy approximately 30 years ago, and recent lumbar cortisone injection providing temporary relief. Reports urinary retention, under evaluation by a urologist.   FINDINGS:   BONES AND ALIGNMENT: Conventional lumbosacral anatomy with 5 non-rib-bearing, lumbar-type vertebral bodies. Unchanged 4 mm anterolisthesis of L3 on L4. Modic type 2 degenerative endplate marrow signal changes at L1-2, L4-5, and L5-S1. Benign vertebral hemangioma in the L3 vertebral body.   SPINAL CORD: Conus terminates at T12.   SOFT TISSUES: Moderate fatty atrophy of the paraspinal muscles.   L1-L2: Left eccentric disc bulge and facet arthropathy result in moderate narrowing of the left lateral recess with mass effect on the traversing left L2 nerve root in the subarticular zone.   L2-L3: Disc bulge and facet arthropathy result in moderate spinal canal stenosis. No significant neural foraminal narrowing.   L3-L4: Anterolisthesis with uncovered disc  results in moderate spinal canal stenosis. No significant neural foraminal narrowing.   L4-L5: Right eccentric disc bulge results in severe narrowing of the right lateral recess with  mass effect on the traversing right L5 nerve root in the subarticular zone. Moderate narrowing of the left lateral recess with mass effect on the traversing left L5 nerve root in the subarticular zone. Prior right hemilaminotomy. No significant neural foraminal narrowing.   L5-S1: Small disc bulge without significant spinal canal stenosis or neural foraminal narrowing.     IMPRESSION: 1. Unchanged multilevel lumbar spondylosis with moderate spinal canal stenosis at L3-4 and L2-3. 2. Severe narrowing of the right lateral recess at L4-5 with mass effect on the traversing right L5 nerve root in the subarticular zone. Moderate narrowing of the left lateral recess at L4-5 with mass effect on the traversing left L5 nerve root in the subarticular zone. 3. Moderate narrowing of the left lateral recess at L1-2 with mass effect on the traversing left L2 nerve root in the subarticular zone.   Electronically signed by: Ryan Chess MD 07/08/2024 09:21 AM EDT RP Workstation: [id number]      PATIENT SURVEYS:  Modified Oswestry:  MODIFIED OSWESTRY DISABILITY SCALE  Date: 11/14/2024 Score  Pain intensity 1 = The pain is bad, but I can manage without having to take pain medication  2. Personal care (washing, dressing, etc.) 0 =  I can take care of myself normally without causing increased pain.  3. Lifting 4 = I can lift only very light weights  4. Walking 3 =  Pain prevents me from walking more than  mile.  5. Sitting 0 =  I can sit in any chair as long as I like.  6. Standing 4 =  Pain prevents me from standing more than 10 minutes.  7. Sleeping 2 =  Even when I take pain medication, I sleep less than 6 hours  8. Social Life 3 =  Pain prevents me from going out very often.  9. Traveling 1 =  I can travel  anywhere, but it increases my pain.  10. Employment/ Homemaking 1 = My normal homemaking/job activities increase my pain, but I can still perform all that is required of me  Total 19/50 (38%)   Interpretation of scores: Score Category Description  0-20% Minimal Disability The patient can cope with most living activities. Usually no treatment is indicated apart from advice on lifting, sitting and exercise  21-40% Moderate Disability The patient experiences more pain and difficulty with sitting, lifting and standing. Travel and social life are more difficult and they may be disabled from work. Personal care, sexual activity and sleeping are not grossly affected, and the patient can usually be managed by conservative means  41-60% Severe Disability Pain remains the main problem in this group, but activities of daily living are affected. These patients require a detailed investigation  61-80% Crippled Back pain impinges on all aspects of the patients life. Positive intervention is required  81-100% Bed-bound These patients are either bed-bound or exaggerating their symptoms  Bluford FORBES Zoe DELENA Karon DELENA, et al. Surgery versus conservative management of stable thoracolumbar fracture: the PRESTO feasibility RCT. [place] ([place]): [employer]; 2021 Nov. [facility] Technology Assessment, No. 25.62.) Appendix 3, Oswestry Disability Index category descriptors. Available from: Findjewelers.cz  Minimally Clinically Important Difference (MCID) = 12.8%  COGNITION: Overall cognitive status: Needs husband.      SENSATION:   MUSCLE LENGTH:   POSTURE: forward neck, B protracted shoulders, R lateral shift, R pelvic rotation, decreased B hip etension   Increased L posterior hip pain with R lateral shift correction   PALPATION:   LUMBAR ROM:  AROM eval  Flexion WFL, pain returns when back upright  Extension Limited, with increased back pain.   Right lateral  flexion Limited with pain, eases in neutral position  Left lateral flexion limited  Right rotation Limited with pain  Left rotation Limited with pain   (Blank rows = not tested)  LOWER EXTREMITY ROM:     Passive  Right eval Left eval  Hip flexion    Hip extension    Hip abduction    Hip adduction    Hip internal rotation    Hip external rotation    Knee flexion    Knee extension    Ankle dorsiflexion    Ankle plantarflexion    Ankle inversion    Ankle eversion     (Blank rows = not tested)  LOWER EXTREMITY MMT:    MMT Right eval Left eval  Hip flexion    Hip extension (seated manually resisted) 4+ 4+  Hip abduction    Hip adduction (seated manually resisted) 3+ 3+  Hip internal rotation 4 4  Hip external rotation 4 4-  Knee flexion 4+ 4  Knee extension    Ankle dorsiflexion    Ankle plantarflexion    Ankle inversion    Ankle eversion     (Blank rows = not tested)  LUMBAR SPECIAL TESTS:    FUNCTIONAL TESTS:    GAIT: Distance walked: 30 ft  Assistive device utilized: None Level of assistance: Complete Independence Comments: antalgic, decreased stance L LE, then decreased stance with R LE, with increased R trunk side bend while on R LE, decreased lumbopelvic control  TREATMENT DATE: 11/14/2024                                                                                                                               PRECAUTIONS:  R LE Has permanent suprapubic catheter for chronic urinary retention     PATIENT EDUCATION:  Education details: POC Person educated: Patient and Spouse Education method: Explanation Education comprehension: verbalized understanding  HOME EXERCISE PROGRAM:   ASSESSMENT:  CLINICAL IMPRESSION: Patient is a 79 y.o. female who was seen today for physical therapy evaluation and treatment for low back pain. She also presents with altered gait pattern and posture, trunk and B glute med weakness, reproduction of pain with lumbar  AROM, and difficulty performing chores, ambulating, performing standing tasks secondary to pain. Pt will benefit from skilled physical therapy services to address the aforementioned deficits.     OBJECTIVE IMPAIRMENTS: Abnormal gait, difficulty walking, decreased ROM, decreased strength, improper body mechanics, postural dysfunction, and pain.   ACTIVITY LIMITATIONS: carrying, lifting, bending, standing, squatting, stairs, transfers, reach over head, and locomotion level  PARTICIPATION LIMITATIONS: meal prep, cleaning, laundry, community activity, and yard work  PERSONAL FACTORS: Age, Fitness, Past/current experiences, Time since onset of injury/illness/exacerbation, and 3+ comorbidities: DM, hx of encephalitis, back surgery, urinary retention with suprapubic catheter are also affecting patient's functional outcome.  REHAB POTENTIAL: Fair    CLINICAL DECISION MAKING: Stable/uncomplicated  EVALUATION COMPLEXITY: Low   GOALS: Goals reviewed with patient? Yes  SHORT TERM GOALS: Target date: 11/25/2024  Pt will be independent with her initial HEP to decrease pain, improve strength and function, and ability to ambulate and perform chores more comfortably for her back.  Baseline: Pt has not yet started her initial HEP. (11/14/2024) Goal status: INITIAL  LONG TERM GOALS: Target date: 01/13/2025  Pt will have a decrease in low back pain to 4/10 or less at worst to promote ability to ambulate, perform standing tasks, chores more comfortably for her back.  Baseline: at least 8/10 at worst for the past 3 months (11/14/2024) Goal status: INITIAL  2.  Pt will improve her B hip extension, abduction, ER, IR strength by at least 1/2 MMT grade to promote ability to perform standing tasks, as well as ambulate more comfortably for her back.  Baseline:  MMT Right eval Left eval  Hip extension (seated manually resisted) 4+ 4+  Hip abduction (seated manually resisted) 3+ 3+  Hip internal rotation 4 4   Hip external rotation 4 4-   Goal status: INITIAL  3.  Pt will improve her Modified Oswestry Low back Pain disability Questionnaire by at least 10% as a demonstration of improved function.  Baseline: 38% (11/14/2024) Goal status: INITIAL   PLAN:  PT FREQUENCY: 1-2x/week  PT DURATION: 8 weeks  PLANNED INTERVENTIONS: 97110-Therapeutic exercises, 97530- Therapeutic activity, 97112- Neuromuscular re-education, 97535- Self Care, 02859- Manual therapy, [phone]- Gait training, [phone]- Electrical stimulation (unattended), [phone]- Ionotophoresis 4mg /ml Dexamethasone, 79439 (1-2 muscles), 20561 (3+ muscles)- Dry Needling, Patient/Family education, and Joint mobilization.  PLAN FOR NEXT SESSION: Posture, trunk and hip strengthening, lumbopelvic stability and control, manual techniques, modalities PRN.    Arlyn Buerkle, PT, DPT 11/14/2024, 1:20 PM  "

## 2024-11-16 ENCOUNTER — Ambulatory Visit

## 2024-11-17 ENCOUNTER — Ambulatory Visit

## 2024-11-17 DIAGNOSIS — R262 Difficulty in walking, not elsewhere classified: Secondary | ICD-10-CM

## 2024-11-17 DIAGNOSIS — M5459 Other low back pain: Secondary | ICD-10-CM

## 2024-11-17 NOTE — Therapy (Signed)
 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)    2017 1 week [facility] and 2 weeks inpatient rehab 6 months outpatient rehab at [facility]    Hypothyroidism    Thyroid disease    Past Surgical History:  Procedure Laterality Date   BACK SURGERY  2000   BREAST SURGERY     COLONOSCOPY     COLONOSCOPY WITH PROPOFOL  N/A 01/19/2018   Procedure: COLONOSCOPY WITH PROPOFOL ;  Surgeon: Gaylyn Gladis PENNER, MD;  Location: [facility] ENDOSCOPY;  Service: Endoscopy;  Laterality: N/A;   parathyroidectomy     Patient Active Problem List   Diagnosis Date Noted   Diabetes mellitus type 2 in nonobese (HCC)    Transaminitis    Chronic bilateral low back pain without sciatica    TBI (traumatic brain injury) (HCC) 05/12/2019   Aphasia 05/08/2019   Type 2 diabetes mellitus with obesity 05/08/2019   Hypothyroidism (acquired) 05/08/2019   Altered mental status    Pyrexia    Seizure (HCC)    Encephalitis    Seizures (HCC) 04/24/2016   Acute respiratory failure (HCC) 04/24/2016    PCP:    Epifanio Alm SQUIBB, MD        REFERRING PROVIDER: Maurine Lukes, PA-C;   Epifanio Alm SQUIBB, MD  Ana Laurita Fulling, MD  REFERRING DIAG: Diagnosis M54.50,G89.29 (ICD-10-CM) - Chronic bilateral low back pain without sciatica  Rationale for Evaluation and Treatment: Rehabilitation  THERAPY DIAG:  Other low back pain  Difficulty  in walking, not elsewhere classified  ONSET DATE: 11/09/2024 (Date PT referral signed)  SUBJECTIVE:                                                                                                                                                                                           SUBJECTIVE STATEMENT: Low back pain is improving slowly. Sitting for long periods or leaning over bothers her back.    PERTINENT  HISTORY:  Chronic B low back pain without sciatica. Has left low back pain. Has surgery about at least 20 years ago for her back and felt good but during the summer, her back started bothering her. Had PT for her back last year (does not remember, might have been [facility]) due to not being able to walk to her car. Did not have the catheter though. Pain has improved since the summer.    2 months ago, pt had surgery to install a catheter because she has trouble passing urine. Pt wants to be careful of the suprapubic catheter at her R LE. Has to get the catheter replaced every 6 weeks, the catheter needs to be replaced. Neurologist aware. Pt started having difficulty passing urine about 8 months ago.    Husband present for initial evaluation per pt so husband can provide history.  Per husband,pt needs help filling out forms and speech. secondary to encephalitis about 7 years ago which affected her memory and speech.   PAIN:  Are you having pain? Yes: NPRS scale: 0-1/10 currently (not much pain) Pain location: L low back and lumbar spine.  Pain description: numb, tender Aggravating factors: walking, bending forward, movement, carries slightly heavier objects Relieving factors: sitting, laying down  PRECAUTIONS:  Has permanent suprapubic catheter for chronic urinary retention   RED FLAGS: Bowel or bladder incontinence: Yes: uses permanent catheter. and Cauda equina syndrome: No   WEIGHT BEARING RESTRICTIONS: No  FALLS:  Has patient fallen in last 6 months? No  LIVING  ENVIRONMENT: Lives with: lives with their spouse Lives in: House/apartment Stairs: Yes: Internal: 21 steps; on right going up and External: 3 steps; on right going up Has following equipment at home: [employer] - 4 wheeled  OCCUPATION: Retired  PLOF: Independent. Used to walk around at [facility] for exercise  PATIENT GOALS: walk better.   NEXT MD VISIT: 3 months from now.   OBJECTIVE:  Note: Objective measures were completed at Evaluation unless otherwise noted.  DIAGNOSTIC FINDINGS:  MR LUMBAR SPINE WO CONTRAST  06/27/2024  Narrative & Impression  EXAM: MRI LUMBAR SPINE 06/27/2024 11:25:00 AM   TECHNIQUE: Multiplanar multisequence MRI of the lumbar spine was performed without the administration of intravenous contrast.   COMPARISON: MRI lumbar spine 07/23/2023.   CLINICAL HISTORY: 79 year old female with chronic back pain radiating to the left leg, status post L4-5 laminectomy approximately 30 years ago, and recent lumbar cortisone injection providing temporary relief. Reports urinary retention, under evaluation by a urologist.   FINDINGS:   BONES AND ALIGNMENT: Conventional lumbosacral anatomy with 5 non-rib-bearing, lumbar-type vertebral bodies. Unchanged 4 mm anterolisthesis of L3 on L4. Modic type 2 degenerative endplate marrow signal changes at L1-2, L4-5, and L5-S1. Benign vertebral hemangioma in the L3 vertebral body.   SPINAL CORD: Conus terminates at T12.   SOFT TISSUES: Moderate fatty atrophy of the paraspinal muscles.   L1-L2: Left eccentric disc bulge and facet arthropathy result in moderate narrowing of the left lateral recess with mass effect on the traversing left L2 nerve root in the subarticular zone.   L2-L3: Disc bulge and facet arthropathy result in moderate spinal canal stenosis. No significant neural foraminal narrowing.   L3-L4: Anterolisthesis with uncovered disc results in moderate spinal canal  stenosis. No significant neural foraminal narrowing.   L4-L5: Right eccentric disc bulge results in severe narrowing of the right lateral recess with mass effect on the traversing right L5 nerve root in  the subarticular zone. Moderate narrowing of the left lateral recess with mass effect on the traversing left L5 nerve root in the subarticular zone. Prior right hemilaminotomy. No significant neural foraminal narrowing.   L5-S1: Small disc bulge without significant spinal canal stenosis or neural foraminal narrowing.     IMPRESSION: 1. Unchanged multilevel lumbar spondylosis with moderate spinal canal stenosis at L3-4 and L2-3. 2. Severe narrowing of the right lateral recess at L4-5 with mass effect on the traversing right L5 nerve root in the subarticular zone. Moderate narrowing of the left lateral recess at L4-5 with mass effect on the traversing left L5 nerve root in the subarticular zone. 3. Moderate narrowing of the left lateral recess at L1-2 with mass effect on the traversing left L2 nerve root in the subarticular zone.   Electronically signed by: Ryan Chess MD 07/08/2024 09:21 AM EDT RP Workstation: [id number]      PATIENT SURVEYS:  Modified Oswestry:  MODIFIED OSWESTRY DISABILITY SCALE  Date: 11/14/2024 Score  Pain intensity 1 = The pain is bad, but I can manage without having to take pain medication  2. Personal care (washing, dressing, etc.) 0 =  I can take care of myself normally without causing increased pain.  3. Lifting 4 = I can lift only very light weights  4. Walking 3 =  Pain prevents me from walking more than  mile.  5. Sitting 0 =  I can sit in any chair as long as I like.  6. Standing 4 =  Pain prevents me from standing more than 10 minutes.  7. Sleeping 2 =  Even when I take pain medication, I sleep less than 6 hours  8. Social Life 3 =  Pain prevents me from going out very often.  9. Traveling 1 =  I can travel anywhere, but it increases my pain.   10. Employment/ Homemaking 1 = My normal homemaking/job activities increase my pain, but I can still perform all that is required of me  Total 19/50 (38%)   Interpretation of scores: Score Category Description  0-20% Minimal Disability The patient can cope with most living activities. Usually no treatment is indicated apart from advice on lifting, sitting and exercise  21-40% Moderate Disability The patient experiences more pain and difficulty with sitting, lifting and standing. Travel and social life are more difficult and they may be disabled from work. Personal care, sexual activity and sleeping are not grossly affected, and the patient can usually be managed by conservative means  41-60% Severe Disability Pain remains the main problem in this group, but activities of daily living are affected. These patients require a detailed investigation  61-80% Crippled Back pain impinges on all aspects of the patients life. Positive intervention is required  81-100% Bed-bound These patients are either bed-bound or exaggerating their symptoms  Bluford FORBES Zoe DELENA Karon DELENA, et al. Surgery versus conservative management of stable thoracolumbar fracture: the PRESTO feasibility RCT. [place] ([place]): [employer]; 2021 Nov. [facility] Technology Assessment, No. 25.62.) Appendix 3, Oswestry Disability Index category descriptors. Available from: Findjewelers.cz  Minimally Clinically Important Difference (MCID) = 12.8%  COGNITION: Overall cognitive status: Needs husband.      SENSATION:   MUSCLE LENGTH:   POSTURE: forward neck, B protracted shoulders, R lateral shift, R pelvic rotation, decreased B hip etension   Increased L posterior hip pain with R lateral shift correction   PALPATION:   LUMBAR ROM:   AROM eval  Flexion WFL, pain returns when back  upright  Extension Limited, with increased back pain.   Right lateral flexion Limited with pain, eases in  neutral position  Left lateral flexion limited  Right rotation Limited with pain  Left rotation Limited with pain   (Blank rows = not tested)  LOWER EXTREMITY ROM:     Passive  Right eval Left eval  Hip flexion    Hip extension    Hip abduction    Hip adduction    Hip internal rotation    Hip external rotation    Knee flexion    Knee extension    Ankle dorsiflexion    Ankle plantarflexion    Ankle inversion    Ankle eversion     (Blank rows = not tested)  LOWER EXTREMITY MMT:    MMT Right eval Left eval  Hip flexion    Hip extension (seated manually resisted) 4+ 4+  Hip abduction    Hip adduction (seated manually resisted) 3+ 3+  Hip internal rotation 4 4  Hip external rotation 4 4-  Knee flexion 4+ 4  Knee extension    Ankle dorsiflexion    Ankle plantarflexion    Ankle inversion    Ankle eversion     (Blank rows = not tested)  LUMBAR SPECIAL TESTS:    FUNCTIONAL TESTS:    GAIT: Distance walked: 30 ft  Assistive device utilized: None Level of assistance: Complete Independence Comments: antalgic, decreased stance L LE, then decreased stance with R LE, with increased R trunk side bend while on R LE, decreased lumbopelvic control  TREATMENT DATE: 11/17/2024                                                                                                                               PRECAUTIONS:  R LE Has permanent suprapubic catheter for chronic urinary retention    Neuromuscular re education  Sitting with lumbar towel roll 2 minutes  Feels good for her back   Then with trunk extension 5x5 seconds    Increased discomfort.   Seated transversus abdominis contraction 10x3 with 5 second holds  Seated glute max squeeze 10x5 seconds for 2 sets   Seated B scapular retraction to promote thoracic extension and decrease stress to low back 10x3 with 5 second holds   No back pain reported after session    Improved exercise technique, movement at target  joints, use of target muscles after mod verbal, visual, tactile cues.     PATIENT EDUCATION:  Education details: There-ex, HEP Person educated: Patient Education method: Explanation, Demonstration, Tactile cues, Verbal cues, and Handouts Education comprehension: verbalized understanding and returned demonstration  HOME EXERCISE PROGRAM:  Sitting with lumbar towel roll 2 minutes  Feels good for her back  Access Code: [email or web address] URL: [email or web address] Date: 11/17/2024 Prepared by: Emil Glassman  Exercises - Seated Transversus Abdominis Bracing  - 3 x daily - 7 x weekly - 3 sets - 10 reps - 5 seconds hold  ASSESSMENT:  CLINICAL IMPRESSION: Worked on promoting gentle lumbar extension, improving transersus, glute max contraction as well as promoting thoracic extension to decrease stress to low back. No back pain reported after session. Pt will benefit from continued skilled physical therapy to decrease pain, improve strength and function.    OBJECTIVE IMPAIRMENTS: Abnormal gait, difficulty walking, decreased ROM, decreased strength, improper body mechanics, postural dysfunction, and pain.   ACTIVITY LIMITATIONS: carrying, lifting, bending, standing, squatting, stairs, transfers, reach over head, and locomotion level  PARTICIPATION LIMITATIONS: meal prep, cleaning, laundry, community activity, and yard work  PERSONAL FACTORS: Age, Fitness, Past/current experiences, Time since onset of injury/illness/exacerbation, and 3+ comorbidities: DM, hx of encephalitis, back surgery, urinary retention with suprapubic catheter are also affecting patient's functional outcome.   REHAB POTENTIAL: Fair    CLINICAL DECISION MAKING: Stable/uncomplicated  EVALUATION COMPLEXITY: Low   GOALS: Goals reviewed with patient? Yes  SHORT TERM GOALS: Target date: 11/25/2024  Pt will be independent with her initial HEP to decrease pain, improve strength and function, and ability to  ambulate and perform chores more comfortably for her back.  Baseline: Pt has not yet started her initial HEP. (11/14/2024) Goal status: INITIAL  LONG TERM GOALS: Target date: 01/13/2025  Pt will have a decrease in low back pain to 4/10 or less at worst to promote ability to ambulate, perform standing tasks, chores more comfortably for her back.  Baseline: at least 8/10 at worst for the past 3 months (11/14/2024) Goal status: INITIAL  2.  Pt will improve her B hip extension, abduction, ER, IR strength by at least 1/2 MMT grade to promote ability to perform standing tasks, as well as ambulate more comfortably for her back.  Baseline:  MMT Right eval Left eval  Hip extension (seated manually resisted) 4+ 4+  Hip abduction (seated manually resisted) 3+ 3+  Hip internal rotation 4 4  Hip external rotation 4 4-   Goal status: INITIAL  3.  Pt will improve her Modified Oswestry Low back Pain disability Questionnaire by at least 10% as a demonstration of improved function.  Baseline: 38% (11/14/2024) Goal status: INITIAL   PLAN:  PT FREQUENCY: 1-2x/week  PT DURATION: 8 weeks  PLANNED INTERVENTIONS: 97110-Therapeutic exercises, 97530- Therapeutic activity, [id number]- Neuromuscular re-education, 97535- Self Care, [id number]- Manual therapy, [phone]- Gait training, [phone]- Electrical stimulation (unattended), [phone]- Ionotophoresis 4mg /ml Dexamethasone, [id number] (1-2 muscles), [id number] (3+ muscles)- Dry Needling, Patient/Family education, and Joint mobilization.  PLAN FOR NEXT SESSION: Posture, trunk and hip strengthening, lumbopelvic stability and control, manual techniques, modalities PRN.    Azim Gillingham, PT, DPT 11/17/2024, 4:26 PM  "

## 2024-11-22 ENCOUNTER — Ambulatory Visit

## 2024-11-25 ENCOUNTER — Ambulatory Visit

## 2024-11-28 ENCOUNTER — Ambulatory Visit

## 2024-11-30 ENCOUNTER — Ambulatory Visit

## 2024-11-30 DIAGNOSIS — R262 Difficulty in walking, not elsewhere classified: Secondary | ICD-10-CM

## 2024-11-30 DIAGNOSIS — M5459 Other low back pain: Secondary | ICD-10-CM | POA: Diagnosis not present

## 2024-11-30 NOTE — Therapy (Signed)
 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)    2017 1 week [facility] and 2 weeks inpatient rehab 6 months outpatient rehab at [facility]    Hypothyroidism    Thyroid disease    Past Surgical History:  Procedure Laterality Date   BACK SURGERY  2000   BREAST SURGERY     COLONOSCOPY     COLONOSCOPY WITH PROPOFOL  N/A 01/19/2018   Procedure: COLONOSCOPY WITH PROPOFOL ;  Surgeon: Gaylyn Gladis PENNER, MD;  Location: [facility] ENDOSCOPY;  Service: Endoscopy;  Laterality: N/A;   parathyroidectomy     Patient Active Problem List   Diagnosis Date Noted   Diabetes mellitus type 2 in nonobese (HCC)    Transaminitis    Chronic bilateral low back pain without sciatica    TBI (traumatic brain injury) (HCC) 05/12/2019   Aphasia 05/08/2019   Type 2 diabetes mellitus with obesity 05/08/2019   Hypothyroidism (acquired) 05/08/2019   Altered mental status    Pyrexia    Seizure (HCC)    Encephalitis    Seizures (HCC) 04/24/2016   Acute respiratory failure (HCC) 04/24/2016    PCP:    Epifanio Alm SQUIBB, MD        REFERRING PROVIDER: Maurine Lukes, PA-C;   Epifanio Alm SQUIBB, MD  Ana Laurita Fulling, MD  REFERRING DIAG: Diagnosis M54.50,G89.29 (ICD-10-CM) - Chronic bilateral low back pain without sciatica  Rationale for Evaluation and Treatment: Rehabilitation  THERAPY DIAG:  Other low back  pain  Difficulty in walking, not elsewhere classified  ONSET DATE: 11/09/2024 (Date PT referral signed)  SUBJECTIVE:                                                                                                                                                                                           SUBJECTIVE STATEMENT: Low back pain is the same. Bothers her when she wakes up but gets better.    improving  slowly. Sitting for long periods or leaning over bothers her back.    PERTINENT HISTORY:  Chronic B low back pain without sciatica. Has left low back pain. Has surgery about at least 20 years ago for her back and felt good but during the summer, her back started bothering her. Had PT for her back last year (does not remember, might have been [facility]) due to not being able to walk to her car. Did not have the catheter though. Pain has improved since the summer.    2 months ago, pt had surgery to install a catheter because she has trouble passing urine. Pt wants to be careful of the suprapubic catheter at her R LE. Has to get the catheter replaced every 6 weeks, the catheter needs to be replaced. Neurologist aware. Pt started having difficulty passing urine about 8 months ago.    Husband present for initial evaluation per pt so husband can provide history.  Per husband,pt needs help filling out forms and speech. secondary to encephalitis about 7 years ago which affected her memory and speech.    No latex allergies.   PAIN:  Are you having pain? Yes: NPRS scale: 0-1/10 currently (not much pain) Pain location: L low back and lumbar spine.  Pain description: numb, tender Aggravating factors: walking, bending forward, movement, carries slightly heavier objects Relieving factors: sitting, laying down  PRECAUTIONS:  Has permanent suprapubic catheter for chronic urinary retention   RED FLAGS: Bowel or bladder incontinence: Yes: uses permanent catheter. and Cauda equina syndrome:  No   WEIGHT BEARING RESTRICTIONS: No  FALLS:  Has patient fallen in last 6 months? No  LIVING ENVIRONMENT: Lives with: lives with their spouse Lives in: House/apartment Stairs: Yes: Internal: 21 steps; on right going up and External: 3 steps; on right going up Has following equipment at home: [employer] - 4 wheeled  OCCUPATION: Retired  PLOF: Independent. Used to walk around at [facility] for exercise  PATIENT GOALS: walk better.   NEXT MD VISIT: 3 months from now.   OBJECTIVE:  Note: Objective measures were completed at Evaluation unless otherwise noted.  DIAGNOSTIC FINDINGS:  MR LUMBAR SPINE WO CONTRAST  06/27/2024  Narrative & Impression  EXAM: MRI LUMBAR SPINE 06/27/2024 11:25:00 AM   TECHNIQUE: Multiplanar multisequence MRI of the lumbar spine was performed without the administration of intravenous contrast.   COMPARISON: MRI lumbar spine 07/23/2023.   CLINICAL HISTORY: 79 year old female with chronic back pain radiating to the left leg, status post L4-5 laminectomy approximately 30 years ago, and recent lumbar cortisone injection providing temporary relief. Reports urinary retention, under evaluation by a urologist.   FINDINGS:   BONES AND ALIGNMENT: Conventional lumbosacral anatomy with 5 non-rib-bearing, lumbar-type vertebral bodies. Unchanged 4 mm anterolisthesis of L3 on L4. Modic type 2 degenerative endplate marrow signal changes at L1-2, L4-5, and L5-S1. Benign vertebral hemangioma in the L3 vertebral body.   SPINAL CORD: Conus terminates at T12.   SOFT TISSUES: Moderate fatty atrophy of the paraspinal muscles.   L1-L2: Left eccentric disc bulge and facet arthropathy result in moderate narrowing of the left lateral recess with mass effect on the traversing left L2 nerve root in the subarticular zone.   L2-L3: Disc bulge and facet arthropathy result in moderate spinal canal stenosis. No significant neural foraminal  narrowing.   L3-L4: Anterolisthesis with uncovered disc results in moderate spinal canal stenosis. No significant neural foraminal narrowing.   L4-L5: Right eccentric disc  bulge results in severe narrowing of the right lateral recess with mass effect on the traversing right L5 nerve root in the subarticular zone. Moderate narrowing of the left lateral recess with mass effect on the traversing left L5 nerve root in the subarticular zone. Prior right hemilaminotomy. No significant neural foraminal narrowing.   L5-S1: Small disc bulge without significant spinal canal stenosis or neural foraminal narrowing.     IMPRESSION: 1. Unchanged multilevel lumbar spondylosis with moderate spinal canal stenosis at L3-4 and L2-3. 2. Severe narrowing of the right lateral recess at L4-5 with mass effect on the traversing right L5 nerve root in the subarticular zone. Moderate narrowing of the left lateral recess at L4-5 with mass effect on the traversing left L5 nerve root in the subarticular zone. 3. Moderate narrowing of the left lateral recess at L1-2 with mass effect on the traversing left L2 nerve root in the subarticular zone.   Electronically signed by: Ryan Chess MD 07/08/2024 09:21 AM EDT RP Workstation: [id number]      PATIENT SURVEYS:  Modified Oswestry:  MODIFIED OSWESTRY DISABILITY SCALE  Date: 11/14/2024 Score  Pain intensity 1 = The pain is bad, but I can manage without having to take pain medication  2. Personal care (washing, dressing, etc.) 0 =  I can take care of myself normally without causing increased pain.  3. Lifting 4 = I can lift only very light weights  4. Walking 3 =  Pain prevents me from walking more than  mile.  5. Sitting 0 =  I can sit in any chair as long as I like.  6. Standing 4 =  Pain prevents me from standing more than 10 minutes.  7. Sleeping 2 =  Even when I take pain medication, I sleep less than 6 hours  8. Social Life 3 =  Pain prevents me from  going out very often.  9. Traveling 1 =  I can travel anywhere, but it increases my pain.  10. Employment/ Homemaking 1 = My normal homemaking/job activities increase my pain, but I can still perform all that is required of me  Total 19/50 (38%)   Interpretation of scores: Score Category Description  0-20% Minimal Disability The patient can cope with most living activities. Usually no treatment is indicated apart from advice on lifting, sitting and exercise  21-40% Moderate Disability The patient experiences more pain and difficulty with sitting, lifting and standing. Travel and social life are more difficult and they may be disabled from work. Personal care, sexual activity and sleeping are not grossly affected, and the patient can usually be managed by conservative means  41-60% Severe Disability Pain remains the main problem in this group, but activities of daily living are affected. These patients require a detailed investigation  61-80% Crippled Back pain impinges on all aspects of the patients life. Positive intervention is required  81-100% Bed-bound These patients are either bed-bound or exaggerating their symptoms  Bluford FORBES Zoe DELENA Karon DELENA, et al. Surgery versus conservative management of stable thoracolumbar fracture: the PRESTO feasibility RCT. [place] ([place]): [employer]; 2021 Nov. [facility] Technology Assessment, No. 25.62.) Appendix 3, Oswestry Disability Index category descriptors. Available from: Findjewelers.cz  Minimally Clinically Important Difference (MCID) = 12.8%  COGNITION: Overall cognitive status: Needs husband.      SENSATION:   MUSCLE LENGTH:   POSTURE: forward neck, B protracted shoulders, R lateral shift, R pelvic rotation, decreased B hip etension   Increased L posterior hip pain with R  lateral shift correction   PALPATION:   LUMBAR ROM:   AROM eval  Flexion WFL, pain returns when back upright  Extension  Limited, with increased back pain.   Right lateral flexion Limited with pain, eases in neutral position  Left lateral flexion limited  Right rotation Limited with pain  Left rotation Limited with pain   (Blank rows = not tested)  LOWER EXTREMITY ROM:     Passive  Right eval Left eval  Hip flexion    Hip extension    Hip abduction    Hip adduction    Hip internal rotation    Hip external rotation    Knee flexion    Knee extension    Ankle dorsiflexion    Ankle plantarflexion    Ankle inversion    Ankle eversion     (Blank rows = not tested)  LOWER EXTREMITY MMT:    MMT Right eval Left eval  Hip flexion    Hip extension (seated manually resisted) 4+ 4+  Hip abduction    Hip adduction (seated manually resisted) 3+ 3+  Hip internal rotation 4 4  Hip external rotation 4 4-  Knee flexion 4+ 4  Knee extension    Ankle dorsiflexion    Ankle plantarflexion    Ankle inversion    Ankle eversion     (Blank rows = not tested)  LUMBAR SPECIAL TESTS:    FUNCTIONAL TESTS:    GAIT: Distance walked: 30 ft  Assistive device utilized: None Level of assistance: Complete Independence Comments: antalgic, decreased stance L LE, then decreased stance with R LE, with increased R trunk side bend while on R LE, decreased lumbopelvic control  TREATMENT DATE: 11/30/2024                                                                                                                               PRECAUTIONS:  R LE Has permanent suprapubic catheter for chronic urinary retention    Neuromuscular re education Performed with the intent on improving posture, core  (trunk and glute) strength to decrease stress to low back   Reclined   Hooklying    hip extension isometrics, leg straight    R 10x3 with 5 second holds    L 10x3 with 5 second holds  Standing with B UE assist   Hip abduction    R 5x3 Low back discomfort   L 10x3  Sitting with lumbar towel roll  B shoulder extension  red band 10x3 with 5 second holds to promote trunk strength  Side stepping 5 ft to the L and 5 ft to the R 5x to promote glute med muscle strengthening.     Improved exercise technique, movement at target joints, use of target muscles after mod verbal, visual, tactile cues.     PATIENT EDUCATION:  Education details: There-ex, HEP Person educated: Patient Education method: Explanation, Demonstration, Tactile cues, Verbal cues, and Handouts Education comprehension: verbalized  understanding and returned demonstration  HOME EXERCISE PROGRAM:  Sitting with lumbar towel roll 2 minutes  Feels good for her back  Reclined   Hooklying    hip extension isometrics, leg straight    R 10x3 with 5 second holds    L 10x3 with 5 second holds    Access Code: [email or web address] URL: [email or web address] Date: 11/30/2024 Prepared by: Emil Glassman  Exercises - Seated Transversus Abdominis Bracing  - 3 x daily - 7 x weekly - 3 sets - 10 reps - 5 seconds hold - Seated Gluteal Sets  - 1 x daily - 7 x weekly - 3 sets - 10 reps - 5 seconds hold - Seated Shoulder Extension and Scapular Retraction with Resistance  - 1 x daily - 7 x weekly - 3 sets - 10 reps - 5 seconds hold  Red band   ASSESSMENT:  CLINICAL IMPRESSION: Worked on promoting transversus, abdominis, glute med and max strengthening, and gentle lumbar extension to decrease stress to low back. Pt states no back pain after session and that it is improving. Pt will benefit from continued skilled physical therapy to decrease pain, improve strength and function.    OBJECTIVE IMPAIRMENTS: Abnormal gait, difficulty walking, decreased ROM, decreased strength, improper body mechanics, postural dysfunction, and pain.   ACTIVITY LIMITATIONS: carrying, lifting, bending, standing, squatting, stairs, transfers, reach over head, and locomotion level  PARTICIPATION LIMITATIONS: meal prep, cleaning, laundry, community activity, and yard  work  PERSONAL FACTORS: Age, Fitness, Past/current experiences, Time since onset of injury/illness/exacerbation, and 3+ comorbidities: DM, hx of encephalitis, back surgery, urinary retention with suprapubic catheter are also affecting patient's functional outcome.   REHAB POTENTIAL: Fair    CLINICAL DECISION MAKING: Stable/uncomplicated  EVALUATION COMPLEXITY: Low   GOALS: Goals reviewed with patient? Yes  SHORT TERM GOALS: Target date: 11/25/2024  Pt will be independent with her initial HEP to decrease pain, improve strength and function, and ability to ambulate and perform chores more comfortably for her back.  Baseline: Pt has not yet started her initial HEP. (11/14/2024) Goal status: INITIAL  LONG TERM GOALS: Target date: 01/13/2025  Pt will have a decrease in low back pain to 4/10 or less at worst to promote ability to ambulate, perform standing tasks, chores more comfortably for her back.  Baseline: at least 8/10 at worst for the past 3 months (11/14/2024) Goal status: INITIAL  2.  Pt will improve her B hip extension, abduction, ER, IR strength by at least 1/2 MMT grade to promote ability to perform standing tasks, as well as ambulate more comfortably for her back.  Baseline:  MMT Right eval Left eval  Hip extension (seated manually resisted) 4+ 4+  Hip abduction (seated manually resisted) 3+ 3+  Hip internal rotation 4 4  Hip external rotation 4 4-   Goal status: INITIAL  3.  Pt will improve her Modified Oswestry Low back Pain disability Questionnaire by at least 10% as a demonstration of improved function.  Baseline: 38% (11/14/2024) Goal status: INITIAL   PLAN:  PT FREQUENCY: 1-2x/week  PT DURATION: 8 weeks  PLANNED INTERVENTIONS: 97110-Therapeutic exercises, 97530- Therapeutic activity, 97112- Neuromuscular re-education, 97535- Self Care, [id number]- Manual therapy, [phone]- Gait training, [phone]- Electrical stimulation (unattended), [phone]- Ionotophoresis 4mg /ml  Dexamethasone, 20560 (1-2 muscles), 20561 (3+ muscles)- Dry Needling, Patient/Family education, and Joint mobilization.  PLAN FOR NEXT SESSION: Posture, trunk and hip strengthening, lumbopelvic stability and control, manual techniques, modalities PRN.    Marissa Lowrey, PT, DPT 11/30/2024, 3:25 PM  "

## 2024-12-05 ENCOUNTER — Ambulatory Visit

## 2024-12-06 ENCOUNTER — Ambulatory Visit

## 2024-12-08 ENCOUNTER — Ambulatory Visit

## 2024-12-08 DIAGNOSIS — R262 Difficulty in walking, not elsewhere classified: Secondary | ICD-10-CM

## 2024-12-08 DIAGNOSIS — M5459 Other low back pain: Secondary | ICD-10-CM

## 2024-12-08 NOTE — Therapy (Signed)
 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)    2017 1 week [facility] and 2 weeks inpatient rehab 6 months outpatient rehab at [facility]    Hypothyroidism    Thyroid disease    Past Surgical History:  Procedure Laterality Date   BACK SURGERY  2000   BREAST SURGERY     COLONOSCOPY     COLONOSCOPY WITH PROPOFOL  N/A 01/19/2018   Procedure: COLONOSCOPY WITH PROPOFOL ;  Surgeon: Gaylyn Gladis PENNER, MD;  Location: [facility] ENDOSCOPY;  Service: Endoscopy;  Laterality: N/A;   parathyroidectomy     Patient Active Problem List   Diagnosis Date Noted   Diabetes mellitus type 2 in nonobese (HCC)    Transaminitis    Chronic bilateral low back pain without sciatica    TBI (traumatic brain injury) (HCC) 05/12/2019   Aphasia 05/08/2019   Type 2 diabetes mellitus with obesity 05/08/2019   Hypothyroidism (acquired) 05/08/2019   Altered mental status    Pyrexia    Seizure (HCC)    Encephalitis    Seizures (HCC) 04/24/2016   Acute respiratory failure (HCC) 04/24/2016    PCP:    Epifanio Alm SQUIBB, MD        REFERRING PROVIDER: Maurine Lukes, PA-C;   Epifanio Alm SQUIBB, MD  Ana Laurita Fulling, MD  REFERRING DIAG: Diagnosis M54.50,G89.29 (ICD-10-CM) - Chronic bilateral low back pain without sciatica  Rationale for Evaluation and Treatment: Rehabilitation  THERAPY DIAG:  Other low back  pain  Difficulty in walking, not elsewhere classified  ONSET DATE: 11/09/2024 (Date PT referral signed)  SUBJECTIVE:                                                                                                                                                                                           SUBJECTIVE STATEMENT:  Could not do the band exercise at home. Can't walk straight, walks crooked. 6/10 low back pain  currently and at most for the past 7 days.    SABRA    PERTINENT HISTORY:  Chronic B low back pain without sciatica. Has left low back pain. Has surgery about at least 20 years ago for her back and felt good but during the summer, her back started bothering her. Had PT for her back last year (does not remember, might have been [facility]) due to not being able to walk to her car. Did not have the catheter though. Pain has improved since the summer.    2 months ago, pt had surgery to install a catheter because she has trouble passing urine. Pt wants to be careful of the suprapubic catheter at her R LE. Has to get the catheter replaced every 6 weeks, the catheter needs to be replaced. Neurologist aware. Pt started having difficulty passing urine about 8 months ago.    Husband present for initial evaluation per pt so husband can provide history.  Per husband,pt needs help filling out forms and speech. secondary to encephalitis about 7 years ago which affected her memory and speech.    No latex allergies.   PAIN:  Are you having pain? Yes: NPRS scale: 0-1/10 currently (not much pain) Pain location: L low back and lumbar spine.  Pain description: numb, tender Aggravating factors: walking, bending forward, movement, carries slightly heavier objects Relieving factors: sitting, laying down  PRECAUTIONS:  Has permanent suprapubic catheter for chronic urinary retention   RED FLAGS: Bowel or bladder incontinence: Yes: uses permanent catheter. and Cauda equina syndrome: No   WEIGHT  BEARING RESTRICTIONS: No  FALLS:  Has patient fallen in last 6 months? No  LIVING ENVIRONMENT: Lives with: lives with their spouse Lives in: House/apartment Stairs: Yes: Internal: 21 steps; on right going up and External: 3 steps; on right going up Has following equipment at home: [employer] - 4 wheeled  OCCUPATION: Retired  PLOF: Independent. Used to walk around at [facility] for exercise  PATIENT GOALS: walk better.   NEXT MD VISIT: 3 months from now.   OBJECTIVE:  Note: Objective measures were completed at Evaluation unless otherwise noted.  DIAGNOSTIC FINDINGS:  MR LUMBAR SPINE WO CONTRAST  06/27/2024  Narrative & Impression  EXAM: MRI LUMBAR SPINE 06/27/2024 11:25:00 AM   TECHNIQUE: Multiplanar multisequence MRI of the lumbar spine was performed without the administration of intravenous contrast.   COMPARISON: MRI lumbar spine 07/23/2023.   CLINICAL HISTORY: 79 year old female with chronic back pain radiating to the left leg, status post L4-5 laminectomy approximately 30 years ago, and recent lumbar cortisone injection providing temporary relief. Reports urinary retention, under evaluation by a urologist.   FINDINGS:   BONES AND ALIGNMENT: Conventional lumbosacral anatomy with 5 non-rib-bearing, lumbar-type vertebral bodies. Unchanged 4 mm anterolisthesis of L3 on L4. Modic type 2 degenerative endplate marrow signal changes at L1-2, L4-5, and L5-S1. Benign vertebral hemangioma in the L3 vertebral body.   SPINAL CORD: Conus terminates at T12.   SOFT TISSUES: Moderate fatty atrophy of the paraspinal muscles.   L1-L2: Left eccentric disc bulge and facet arthropathy result in moderate narrowing of the left lateral recess with mass effect on the traversing left L2 nerve root in the subarticular zone.   L2-L3: Disc bulge and facet arthropathy result in moderate spinal canal stenosis. No significant neural foraminal narrowing.    L3-L4: Anterolisthesis with uncovered disc results in moderate spinal canal stenosis. No significant neural foraminal narrowing.   L4-L5: Right  eccentric disc bulge results in severe narrowing of the right lateral recess with mass effect on the traversing right L5 nerve root in the subarticular zone. Moderate narrowing of the left lateral recess with mass effect on the traversing left L5 nerve root in the subarticular zone. Prior right hemilaminotomy. No significant neural foraminal narrowing.   L5-S1: Small disc bulge without significant spinal canal stenosis or neural foraminal narrowing.     IMPRESSION: 1. Unchanged multilevel lumbar spondylosis with moderate spinal canal stenosis at L3-4 and L2-3. 2. Severe narrowing of the right lateral recess at L4-5 with mass effect on the traversing right L5 nerve root in the subarticular zone. Moderate narrowing of the left lateral recess at L4-5 with mass effect on the traversing left L5 nerve root in the subarticular zone. 3. Moderate narrowing of the left lateral recess at L1-2 with mass effect on the traversing left L2 nerve root in the subarticular zone.   Electronically signed by: Ryan Chess MD 07/08/2024 09:21 AM EDT RP Workstation: [id number]      PATIENT SURVEYS:  Modified Oswestry:  MODIFIED OSWESTRY DISABILITY SCALE  Date: 11/14/2024 Score  Pain intensity 1 = The pain is bad, but I can manage without having to take pain medication  2. Personal care (washing, dressing, etc.) 0 =  I can take care of myself normally without causing increased pain.  3. Lifting 4 = I can lift only very light weights  4. Walking 3 =  Pain prevents me from walking more than  mile.  5. Sitting 0 =  I can sit in any chair as long as I like.  6. Standing 4 =  Pain prevents me from standing more than 10 minutes.  7. Sleeping 2 =  Even when I take pain medication, I sleep less than 6 hours  8. Social Life 3 =  Pain prevents me from going out  very often.  9. Traveling 1 =  I can travel anywhere, but it increases my pain.  10. Employment/ Homemaking 1 = My normal homemaking/job activities increase my pain, but I can still perform all that is required of me  Total 19/50 (38%)   Interpretation of scores: Score Category Description  0-20% Minimal Disability The patient can cope with most living activities. Usually no treatment is indicated apart from advice on lifting, sitting and exercise  21-40% Moderate Disability The patient experiences more pain and difficulty with sitting, lifting and standing. Travel and social life are more difficult and they may be disabled from work. Personal care, sexual activity and sleeping are not grossly affected, and the patient can usually be managed by conservative means  41-60% Severe Disability Pain remains the main problem in this group, but activities of daily living are affected. These patients require a detailed investigation  61-80% Crippled Back pain impinges on all aspects of the patients life. Positive intervention is required  81-100% Bed-bound These patients are either bed-bound or exaggerating their symptoms  Bluford FORBES Zoe DELENA Karon DELENA, et al. Surgery versus conservative management of stable thoracolumbar fracture: the PRESTO feasibility RCT. [place] ([place]): [employer]; 2021 Nov. [facility] Technology Assessment, No. 25.62.) Appendix 3, Oswestry Disability Index category descriptors. Available from: Findjewelers.cz  Minimally Clinically Important Difference (MCID) = 12.8%  COGNITION: Overall cognitive status: Needs husband.      SENSATION:   MUSCLE LENGTH:   POSTURE: forward neck, B protracted shoulders, R lateral shift, R pelvic rotation, decreased B hip etension   Increased L posterior hip pain  with R lateral shift correction   PALPATION:   LUMBAR ROM:   AROM eval  Flexion WFL, pain returns when back upright  Extension Limited,  with increased back pain.   Right lateral flexion Limited with pain, eases in neutral position  Left lateral flexion limited  Right rotation Limited with pain  Left rotation Limited with pain   (Blank rows = not tested)  LOWER EXTREMITY ROM:     Passive  Right eval Left eval  Hip flexion    Hip extension    Hip abduction    Hip adduction    Hip internal rotation    Hip external rotation    Knee flexion    Knee extension    Ankle dorsiflexion    Ankle plantarflexion    Ankle inversion    Ankle eversion     (Blank rows = not tested)  LOWER EXTREMITY MMT:    MMT Right eval Left eval  Hip flexion    Hip extension (seated manually resisted) 4+ 4+  Hip abduction    Hip adduction (seated manually resisted) 3+ 3+  Hip internal rotation 4 4  Hip external rotation 4 4-  Knee flexion 4+ 4  Knee extension    Ankle dorsiflexion    Ankle plantarflexion    Ankle inversion    Ankle eversion     (Blank rows = not tested)  LUMBAR SPECIAL TESTS:    FUNCTIONAL TESTS:    GAIT: Distance walked: 30 ft  Assistive device utilized: None Level of assistance: Complete Independence Comments: antalgic, decreased stance L LE, then decreased stance with R LE, with increased R trunk side bend while on R LE, decreased lumbopelvic control  TREATMENT DATE: 12/08/2024                                                                                                                               PRECAUTIONS:  R LE Has permanent suprapubic catheter for chronic urinary retention   Neuromuscular re education Performed with the intent on improving posture, core  (trunk and glute) strength to decrease stress to low back  Standing with B UE assist   Hip abduction    R 5x5 seconds for 2 sets   L 5x5 seconds for 2 sets   Side step with yellow band around ankles 5 ft to the L and 5 ft to the R for 5 x   Performed to promote glute med strength and decrease stress to low back when standing and  walking   Standing B shoulder extension with scapular retraction yellow band 10x5 seconds for 3 sets  Sitting with upright posture  Manually resisted L trunk side bend isometrics in neutral 10x5 seconds for 3 sets to promote more neutral posture/decrease R lateral shift  Sitting B shoulder extension isometrics, hands on thighs 10x 5 seconds for 3 sets  Standing with B UE assist  Mini squats 10x2 to promote glute max strength.  Improved exercise technique, movement at target joints, use of target muscles after mod verbal, visual, tactile cues.     PATIENT EDUCATION:  Education details: There-ex, HEP Person educated: Patient Education method: Explanation, Demonstration, Tactile cues, Verbal cues, and Handouts Education comprehension: verbalized understanding and returned demonstration  HOME EXERCISE PROGRAM:  Sitting with lumbar towel roll 2 minutes  Feels good for her back  Reclined   Hooklying    hip extension isometrics, leg straight    R 10x3 with 5 second holds    L 10x3 with 5 second holds   Sitting B shoulder extension isometrics, hands on thighs 10x 5 seconds for 3 sets   Access Code: [email or web address] URL: [email or web address] Date: 11/30/2024 Prepared by: Emil Glassman  Exercises - Seated Transversus Abdominis Bracing  - 3 x daily - 7 x weekly - 3 sets - 10 reps - 5 seconds hold - Seated Gluteal Sets  - 1 x daily - 7 x weekly - 3 sets - 10 reps - 5 seconds hold - Seated Shoulder Extension and Scapular Retraction with Resistance  - 1 x daily - 7 x weekly - 3 sets - 10 reps - 5 seconds hold  Red band   ASSESSMENT:  CLINICAL IMPRESSION: Worked on posture, trunk, glute med and max strength to decrease stress to low back. Pt tolerated session well without aggravation of symptoms. Pt will benefit from continued skilled physical therapy to decrease pain, improve strength and function.    OBJECTIVE IMPAIRMENTS: Abnormal gait, difficulty walking,  decreased ROM, decreased strength, improper body mechanics, postural dysfunction, and pain.   ACTIVITY LIMITATIONS: carrying, lifting, bending, standing, squatting, stairs, transfers, reach over head, and locomotion level  PARTICIPATION LIMITATIONS: meal prep, cleaning, laundry, community activity, and yard work  PERSONAL FACTORS: Age, Fitness, Past/current experiences, Time since onset of injury/illness/exacerbation, and 3+ comorbidities: DM, hx of encephalitis, back surgery, urinary retention with suprapubic catheter are also affecting patient's functional outcome.   REHAB POTENTIAL: Fair    CLINICAL DECISION MAKING: Stable/uncomplicated  EVALUATION COMPLEXITY: Low   GOALS: Goals reviewed with patient? Yes  SHORT TERM GOALS: Target date: 11/25/2024  Pt will be independent with her initial HEP to decrease pain, improve strength and function, and ability to ambulate and perform chores more comfortably for her back.  Baseline: Pt has not yet started her initial HEP. (11/14/2024) Goal status: INITIAL  LONG TERM GOALS: Target date: 01/13/2025  Pt will have a decrease in low back pain to 4/10 or less at worst to promote ability to ambulate, perform standing tasks, chores more comfortably for her back.  Baseline: at least 8/10 at worst for the past 3 months (11/14/2024) Goal status: INITIAL  2.  Pt will improve her B hip extension, abduction, ER, IR strength by at least 1/2 MMT grade to promote ability to perform standing tasks, as well as ambulate more comfortably for her back.  Baseline:  MMT Right eval Left eval  Hip extension (seated manually resisted) 4+ 4+  Hip abduction (seated manually resisted) 3+ 3+  Hip internal rotation 4 4  Hip external rotation 4 4-   Goal status: INITIAL  3.  Pt will improve her Modified Oswestry Low back Pain disability Questionnaire by at least 10% as a demonstration of improved function.  Baseline: 38% (11/14/2024) Goal status:  INITIAL   PLAN:  PT FREQUENCY: 1-2x/week  PT DURATION: 8 weeks  PLANNED INTERVENTIONS: 97110-Therapeutic exercises, 97530- Therapeutic activity, [id number]- Neuromuscular re-education, 97535- Self Care, [id number]- Manual therapy, [id number]- Gait training, [id number]-  Electrical stimulation (unattended), [id number]- Ionotophoresis 4mg /ml Dexamethasone, 20560 (1-2 muscles), 20561 (3+ muscles)- Dry Needling, Patient/Family education, and Joint mobilization.  PLAN FOR NEXT SESSION: Posture, trunk and hip strengthening, lumbopelvic stability and control, manual techniques, modalities PRN.    Zuha Dejonge, PT, DPT 12/08/2024, 2:33 PM  "

## 2024-12-14 ENCOUNTER — Ambulatory Visit

## 2024-12-16 ENCOUNTER — Ambulatory Visit

## 2024-12-20 ENCOUNTER — Ambulatory Visit

## 2024-12-22 ENCOUNTER — Ambulatory Visit: Admitting: Physician Assistant

## 2024-12-22 ENCOUNTER — Ambulatory Visit

## 2024-12-27 ENCOUNTER — Ambulatory Visit

## 2024-12-29 ENCOUNTER — Ambulatory Visit

## 2025-01-03 ENCOUNTER — Ambulatory Visit

## 2025-01-04 ENCOUNTER — Ambulatory Visit

## 2025-01-05 ENCOUNTER — Ambulatory Visit

## 2025-01-06 ENCOUNTER — Ambulatory Visit

## 2025-01-09 ENCOUNTER — Ambulatory Visit

## 2025-01-11 ENCOUNTER — Ambulatory Visit

## 2025-01-13 ENCOUNTER — Ambulatory Visit

## 2025-01-16 ENCOUNTER — Ambulatory Visit

## 2025-01-18 ENCOUNTER — Ambulatory Visit

## 2025-01-19 ENCOUNTER — Ambulatory Visit

## 2025-01-20 ENCOUNTER — Ambulatory Visit

## 2025-01-25 ENCOUNTER — Ambulatory Visit

## 2025-01-27 ENCOUNTER — Ambulatory Visit
